# Patient Record
Sex: Male | Born: 1940 | Race: White | Hispanic: No | Marital: Married | Smoking: Former smoker
Health system: Southern US, Community
[De-identification: ages and names within clinical notes are randomized; demographics above are authoritative.]

## PROBLEM LIST (undated history)

## (undated) DIAGNOSIS — N400 Enlarged prostate without lower urinary tract symptoms: Secondary | ICD-10-CM

## (undated) DIAGNOSIS — Z972 Presence of dental prosthetic device (complete) (partial): Secondary | ICD-10-CM

## (undated) DIAGNOSIS — N189 Chronic kidney disease, unspecified: Secondary | ICD-10-CM

## (undated) DIAGNOSIS — E785 Hyperlipidemia, unspecified: Secondary | ICD-10-CM

## (undated) DIAGNOSIS — R51 Headache: Secondary | ICD-10-CM

## (undated) DIAGNOSIS — Z87442 Personal history of urinary calculi: Secondary | ICD-10-CM

## (undated) DIAGNOSIS — H9190 Unspecified hearing loss, unspecified ear: Secondary | ICD-10-CM

## (undated) DIAGNOSIS — Z973 Presence of spectacles and contact lenses: Secondary | ICD-10-CM

## (undated) DIAGNOSIS — I10 Essential (primary) hypertension: Secondary | ICD-10-CM

## (undated) HISTORY — PX: APPENDECTOMY: SHX54

## (undated) HISTORY — PX: HERNIA REPAIR: SHX51

## (undated) HISTORY — DX: Benign prostatic hyperplasia without lower urinary tract symptoms: N40.0

## (undated) HISTORY — PX: NASAL SEPTUM SURGERY: SHX37

## (undated) HISTORY — DX: Essential (primary) hypertension: I10

## (undated) HISTORY — PX: TONSILLECTOMY: SUR1361

## (undated) HISTORY — PX: OTHER SURGICAL HISTORY: SHX169

## (undated) HISTORY — PX: BACK SURGERY: SHX140

## (undated) HISTORY — PX: ACHILLES TENDON SURGERY: SHX542

## (undated) HISTORY — DX: Hyperlipidemia, unspecified: E78.5

## (undated) HISTORY — PX: COLONOSCOPY: SHX174

---

## 2003-12-08 ENCOUNTER — Ambulatory Visit (HOSPITAL_COMMUNITY): Admission: RE | Admit: 2003-12-08 | Discharge: 2003-12-08 | Payer: Self-pay | Admitting: Gastroenterology

## 2004-02-20 ENCOUNTER — Encounter: Admission: RE | Admit: 2004-02-20 | Discharge: 2004-02-20 | Payer: Self-pay | Admitting: *Deleted

## 2004-02-29 ENCOUNTER — Ambulatory Visit (HOSPITAL_BASED_OUTPATIENT_CLINIC_OR_DEPARTMENT_OTHER): Admission: RE | Admit: 2004-02-29 | Discharge: 2004-02-29 | Payer: Self-pay | Admitting: Orthopedic Surgery

## 2004-02-29 ENCOUNTER — Ambulatory Visit (HOSPITAL_COMMUNITY): Admission: RE | Admit: 2004-02-29 | Discharge: 2004-02-29 | Payer: Self-pay | Admitting: Orthopedic Surgery

## 2004-02-29 ENCOUNTER — Encounter (INDEPENDENT_AMBULATORY_CARE_PROVIDER_SITE_OTHER): Payer: Self-pay | Admitting: *Deleted

## 2005-01-23 ENCOUNTER — Ambulatory Visit (HOSPITAL_COMMUNITY): Admission: RE | Admit: 2005-01-23 | Discharge: 2005-01-23 | Payer: Self-pay | Admitting: Orthopedic Surgery

## 2005-01-23 ENCOUNTER — Ambulatory Visit (HOSPITAL_BASED_OUTPATIENT_CLINIC_OR_DEPARTMENT_OTHER): Admission: RE | Admit: 2005-01-23 | Discharge: 2005-01-23 | Payer: Self-pay | Admitting: Orthopedic Surgery

## 2005-05-29 ENCOUNTER — Ambulatory Visit (HOSPITAL_BASED_OUTPATIENT_CLINIC_OR_DEPARTMENT_OTHER): Admission: RE | Admit: 2005-05-29 | Discharge: 2005-05-29 | Payer: Self-pay | Admitting: Orthopedic Surgery

## 2005-12-13 ENCOUNTER — Ambulatory Visit (HOSPITAL_BASED_OUTPATIENT_CLINIC_OR_DEPARTMENT_OTHER): Admission: RE | Admit: 2005-12-13 | Discharge: 2005-12-13 | Payer: Self-pay | Admitting: Orthopedic Surgery

## 2007-01-21 ENCOUNTER — Encounter: Admission: RE | Admit: 2007-01-21 | Discharge: 2007-01-21 | Payer: Self-pay | Admitting: Orthopaedic Surgery

## 2007-04-15 ENCOUNTER — Ambulatory Visit (HOSPITAL_BASED_OUTPATIENT_CLINIC_OR_DEPARTMENT_OTHER): Admission: RE | Admit: 2007-04-15 | Discharge: 2007-04-15 | Payer: Self-pay | Admitting: Orthopedic Surgery

## 2009-03-21 ENCOUNTER — Ambulatory Visit (HOSPITAL_BASED_OUTPATIENT_CLINIC_OR_DEPARTMENT_OTHER): Admission: RE | Admit: 2009-03-21 | Discharge: 2009-03-21 | Payer: Self-pay | Admitting: Orthopedic Surgery

## 2009-03-21 ENCOUNTER — Encounter (INDEPENDENT_AMBULATORY_CARE_PROVIDER_SITE_OTHER): Payer: Self-pay | Admitting: Orthopedic Surgery

## 2010-06-11 ENCOUNTER — Encounter: Payer: Self-pay | Admitting: Orthopedic Surgery

## 2010-08-23 LAB — POCT HEMOGLOBIN-HEMACUE: Hemoglobin: 15.3 g/dL (ref 13.0–17.0)

## 2010-08-24 LAB — BASIC METABOLIC PANEL
BUN: 20 mg/dL (ref 6–23)
CO2: 27 mEq/L (ref 19–32)
Calcium: 8.7 mg/dL (ref 8.4–10.5)
Chloride: 105 mEq/L (ref 96–112)
Creatinine, Ser: 1.36 mg/dL (ref 0.4–1.5)
GFR calc Af Amer: >60 mL/min (ref >60)
GFR calc non Af Amer: 52 mL/min — ABNORMAL LOW (ref >60)
Glucose, Bld: 81 mg/dL (ref 70–99)
Potassium: 4.1 mEq/L (ref 3.5–5.1)
Sodium: 139 mEq/L (ref 135–145)

## 2010-10-03 NOTE — Op Note (Signed)
NAME:  JAYMIEN, LANDIN            ACCOUNT NO.:  [id number]   MEDICAL RECORD NO.:  [id number]          PATIENT TYPE:  AMB   LOCATION:  DSC                          FACILITY:  [facility]   PHYSICIAN:  Cindee Salt, M.D.       DATE OF BIRTH:  Feb 19, 1941   DATE OF PROCEDURE:  04/15/2007  DATE OF DISCHARGE:                               OPERATIVE REPORT   PREOPERATIVE DIAGNOSIS:  Stenosing tenosynovitis, left thumb.   POSTOPERATIVE DIAGNOSIS:  Stenosing tenosynovitis, left thumb.   OPERATION:  Release A1 pulley left thumb.   SURGEON:  Cindee Salt, M.D.   ANESTHESIA:  Forearm based IV regional.   ANESTHESIOLOGIST:  [place].   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  This has not responded to conservative treatment.  He is desirous of undergoing release.  He is aware of risks and  complications including infection, recurrence, injury to arteries,  nerves, tendons, incomplete relief of symptoms, dystrophy.  In the  preoperative area the patient is seen, the extremity marked by both the  patient and surgeon.  Antibiotic given, questions again encouraged and  answered.   PROCEDURE:  The patient is brought to the operating room where forearm  based IV regional anesthetic was carried out without difficulty.  He was  prepped using DuraPrep, supine position, left arm free.  After adequate  anesthesia was afforded, a transverse incision was made over the A1  pulley, metacarpophalangeal joint flexion crease of the left thumb,  carried down through subcutaneous tissue.  Bleeders were  electrocauterized.  Neurovascular bundles identified.  Retractors  placed.  The A1 pulley was identified and this was incised on its radial  aspect.  The oblique pulley was left intact.  Thumb placed through full  range motion, no further triggering was evident.  The wound was  irrigated.  Skin closed with interrupted 5-0 Vicryl Rapide sutures.  Sterile  compressive dressing was applied.  The patient tolerated the  procedure well and was taken to the recovery for observation in  satisfactory condition.  He is discharged home to return to [facility]  of [place] in one week on Vicodin.           ______________________________  Cindee Salt, M.D.     GK/MEDQ  D:  04/15/2007  T:  04/15/2007  Job:  [id number]   cc:   Dr. Janey Greaser

## 2010-10-06 NOTE — Op Note (Signed)
NAME:  [name] [name], [name] [name]            ACCOUNT NO.:  [id number]   MEDICAL RECORD NO.:  [id number]          PATIENT TYPE:  AMB   LOCATION:  DSC                          FACILITY:  [facility]   PHYSICIAN:  Cindee Salt, M.D.       DATE OF BIRTH:  07/14/1940   DATE OF PROCEDURE:  12/13/2005  DATE OF DISCHARGE:                                 OPERATIVE REPORT   PREOPERATIVE DIAGNOSIS:  Stenosing tenosynovitis, right thumb.   POSTOPERATIVE DIAGNOSIS:  Stenosing tenosynovitis, right thumb.   OPERATION:  Release A1 pulley, right thumb.   SURGEON:  Merlyn Lot, M.D.   ASSISTANTCarolyne Fiscal, R.N.   ANESTHESIA:  IV regional.   HISTORY:  The patient is a 70 year old male with a history of triggering of  his right thumb.  This has not responded to conservative treatment.  He has  elected to undergo surgical release.  Perioperative course discussed along  with the risks and complications.  She is aware there is no guarantee with  surgery, possibility of infection, recurrence, injury to arteries-nerves-  tendons, dystrophy.  In the preoperative area, questions were encouraged and  answered.  The extremity was marked by both patient and surgeon.   PROCEDURE:  The patient is brought to the operating room where a forearm  based IV regional anesthetic was carried out without difficulty.  He was  prepped using DuraPrep, supine position, right arm free.  A transverse  incision was made over the A1 pulley of the right thumb, carried down  through subcutaneous tissue.  The neurovascular bundles were identified and  retracted.  The A1 pulley was then released in its radial aspect.  Care was  taken to protect the oblique pulley.  The thumb placed through a full range  motion.  No further triggering was evident.  The wound was irrigated.  The  skin was closed with interrupted 5-0 nylon sutures.  A sterile compressive  dressing was applied.  The patient tolerated the procedure well and was  taken to the recovery  observation in satisfactory condition.  He was  discharged home to return to the [facility] of [place] in 1 week on  Vicodin.           ______________________________  Cindee Salt, M.D.     GK/MEDQ  D:  12/13/2005  T:  12/13/2005  Job:  [id number]

## 2010-10-06 NOTE — Op Note (Signed)
NAMEMarland [name]  ARJEN, DERINGER            ACCOUNT NO.:  [id number]   MEDICAL RECORD NO.:  [id number]          PATIENT TYPE:  AMB   LOCATION:  DSC                          FACILITY:  [facility]   PHYSICIAN:  Cindee Salt, M.D.       DATE OF BIRTH:  1941-04-26   DATE OF PROCEDURE:  DATE OF DISCHARGE:                                 OPERATIVE REPORT   DATE OF PROCEDURE:  February 29, 2004.   PREOPERATIVE DIAGNOSIS:  Mucoid cyst, IP joint, right thumb.   POSTOPERATIVE DIAGNOSIS:  Mucoid cyst, IP joint, right thumb.   OPERATION:  Excision, mucoid cyst; debridement interphalangeal joint, right  thumb.   SUBJECTIVE:  Cindee Salt, MD.   Threasa HeadsCarolyne Fiscal.   ANESTHESIA:  Forearm-based IV regional.   HISTORY:  The patient is a 70 year old male with a history of a mass, DIP  joint of his thumb.  This transilluminates.  He shows mild, degenerative  changes beneath it.   PROCEDURE:  The patient is brought to the operating room where a forearm-  based IV regional anesthetic was carried out without difficulty.  He was  prepped using Duraprep in the supine position, the right arm free.  A  curvilinear incision was made over the IP joint, carried down through  subcutaneous tissue, bleeders were electrocauterized.  The dissection  carried down to the cyst, which kind of traded the corner of the eccentric  tendon.  With sharp dissection, this was dissected free.  The joint was  opened radially and ulnarly, and a thorough debridement was performed,  including a synovectomy of the joint and debridement of exostoses.  When no  further lesions were identified, the wound was copiously irrigated with  saline, and the skin closed with interrupted 5-0 nylon sutures.  A sterile  compressive dressing and splint to the right thumb were applied.  The  patient tolerated the procedure well and was taken to the recovery room for  observation in satisfactory condition.  He is discharged home to return to  the [facility] of  [place] in 1 week on Vicodin.       GK/MEDQ  D:  02/29/2004  T:  02/29/2004  Job:  [id number]

## 2010-10-06 NOTE — Op Note (Signed)
NAME:  [name] [name], [name] [name]            ACCOUNT NO.:  [id number]   MEDICAL RECORD NO.:  [id number]          PATIENT TYPE:  AMB   LOCATION:  DSC                          FACILITY:  [facility]   PHYSICIAN:  Cindee Salt, M.D.       DATE OF BIRTH:  08/14/1940   DATE OF PROCEDURE:  01/23/2005  DATE OF DISCHARGE:                                 OPERATIVE REPORT   PREOPERATIVE DIAGNOSIS:  Carpal tunnel syndrome right hand.   POSTOPERATIVE DIAGNOSIS:  Carpal tunnel syndrome right hand.   OPERATION:  Decompression right median nerve.   SURGEON:  Cindee Salt, M.D.   ASSISTANT:  Carolyne Fiscal R.N.   ANESTHESIA:  Forearm based IV regional.   HISTORY:  The patient is a 70 year old male with history of carpal tunnel  syndrome, EMG nerve conductions positive which has not responded to  conservative treatment.   PROCEDURE:  The patient is brought to the operating room where his arm was  marked by patient and surgeon. He was given a forearm based IV regional  anesthetic and prepped using DuraPrep, supine position, right arm free.  Longitudinal incision was made in the palm, carried down through  subcutaneous tissue. Bleeders were electrocauterized. Palmar fascia was  split, superficial palmar arch identified, flexor tendon of the ring and  little finger identified to the ulnar side of median nerve.  The carpal  retinaculum was incised with sharp dissection, right angle and Sewell  retractor placed between skin and forearm fascia.  Fascia was released for  approximately a 1.5 cm proximal to the wrist crease under direct vision.  The canal was explored. No further lesions were identified. The wound was  irrigated. Skin was closed interrupted 5-0 nylon sutures. A sterile  compressive dressing and splint was applied. The patient tolerated the  procedure well and was taken to the recovery room for observation in  satisfactory condition. He is discharged home to return to the [facility]  of [place] in one week on  Vicodin.           ______________________________  Cindee Salt, M.D.     GK/MEDQ  D:  01/23/2005  T:  01/23/2005  Job:  [id number]   cc:   Al Decant. Janey Greaser, MD  [address]  [place]  [place] [address]  Fax: [phone]

## 2010-10-06 NOTE — Op Note (Signed)
NAME:  [name] [name], [name] [name]            ACCOUNT NO.:  [id number]   MEDICAL RECORD NO.:  [id number]          PATIENT TYPE:  AMB   LOCATION:  DSC                          FACILITY:  [facility]   PHYSICIAN:  Cindee Salt, M.D.       DATE OF BIRTH:  April 08, 1941   DATE OF PROCEDURE:  05/29/2005  DATE OF DISCHARGE:                               OPERATIVE REPORT   PREOPERATIVE DIAGNOSIS:  Carpal tunnel syndrome left hand.   POSTOPERATIVE DIAGNOSIS:  Carpal tunnel syndrome left hand.   OPERATION:  Decompression left median nerve.   SURGEON:  Cindee Salt, M.D.   ASSISTANT:  Carolyne Fiscal, R.N.   ANESTHESIA:  General.   HISTORY:  The patient is a 70 year old male with a history of carpal  tunnel syndrome. EMG/nerve conductions positive which has not responded  to conservative treatment.   DESCRIPTION OF PROCEDURE:  The patient is brought to the operating room  where a general anesthetic was carried out without difficulty.  He was  prepped using DuraPrep, supine position, left arm free.  A longitudinal  incision was made in the palm and carried down through subcutaneous  tissue.  Bleeders were electrocauterized.  Palmar fascia was split.  Superficial palmar arch identified.  Flexor tendon to the ring and  little finger identified to the ulnar side of the median nerve.  Carpal  retinaculum was incised with sharp dissection, right angle and Sewall  retractor placed between skin forearm fascia.  Fascia was released for  approximately 1.5 cm proximal to the wrist crease under direct vision.  The canal was explored.  No further lesions were identified.  The wound  was irrigated.  The skin was closed with interrupted 5-0 nylon sutures.  The tenosynovial tissue was moderately thickened.   DISPOSITION:  The patient was taken to the recovery observation in  satisfactory condition.  He is discharged to return to the [facility]  of [place] in one week on Vicodin.           ______________________________  Cindee Salt, M.D.     GK/MEDQ  D:  05/29/2005  T:  05/30/2005  Job:  [id number]

## 2010-10-06 NOTE — Op Note (Signed)
NAME:  [name] [name], [name] [name]                      ACCOUNT NO.:  [id number]   MEDICAL RECORD NO.:  [id number]                   PATIENT TYPE:  AMB   LOCATION:  ENDO                                 FACILITY:  [facility]   PHYSICIAN:  Danise Edge, M.D.                DATE OF BIRTH:  1940-08-21   DATE OF PROCEDURE:  12/08/2003  DATE OF DISCHARGE:                                 OPERATIVE REPORT   PROCEDURE:  Screening colonoscopy.   INDICATIONS FOR PROCEDURE:  [name] is a 70 year old male born  1940-12-15.  [name] is scheduled to undergo his first screening  colonoscopy with polypectomy to prevent colon cancer.   ENDOSCOPIST:  Danise Edge, M.D.   PREMEDICATION:  Versed 7 mg, Demerol 70 mg.   DESCRIPTION OF PROCEDURE:  After obtaining informed consent, [name]  was placed in the left lateral decubitus position. I administered  intravenous Demerol and intravenous Versed to achieve conscious sedation for  the procedure. The patient's blood pressure, oxygen saturation and cardiac  rhythm were monitored throughout the procedure and documented in the medical  record.   Anal inspection and digital rectal exam were normal. The Olympus adjustable  pediatric colonoscope was introduced into the rectum and advanced to the  cecum. Colonic preparation for the exam today was excellent.   RECTUM:  Normal.   SIGMOID COLON AND DESCENDING COLON:  Normal.   SPLENIC FLEXURE:  Normal.   TRANSVERSE COLON:  Normal.   HEPATIC FLEXURE:  Normal.   ASCENDING COLON:  Normal.   CECUM AND ILEOCECAL VALVE:  Normal.   ASSESSMENT:  Normal screening proctocolonoscopy to the cecum.                                               Danise Edge, M.D.    MJ/MEDQ  D:  12/08/2003  T:  12/08/2003  Job:  [id number]   cc:   Al Decant. Janey Greaser, MD  [address]  [place]  [place] [address]  Fax: [phone]

## 2011-02-27 LAB — BASIC METABOLIC PANEL
BUN: 25 — ABNORMAL HIGH
CO2: 29
Calcium: 9.2
Chloride: 103
Creatinine, Ser: 1.23
GFR calc Af Amer: >60
GFR calc non Af Amer: 59 — ABNORMAL LOW
Glucose, Bld: 99
Potassium: 4.9
Sodium: 139

## 2011-02-27 LAB — POCT HEMOGLOBIN-HEMACUE
Hemoglobin: 15.6
Operator id: 123881

## 2013-01-21 ENCOUNTER — Ambulatory Visit (INDEPENDENT_AMBULATORY_CARE_PROVIDER_SITE_OTHER): Payer: BC Managed Care – PPO | Admitting: Surgery

## 2013-01-26 ENCOUNTER — Encounter (INDEPENDENT_AMBULATORY_CARE_PROVIDER_SITE_OTHER): Payer: Self-pay

## 2013-01-26 ENCOUNTER — Ambulatory Visit (INDEPENDENT_AMBULATORY_CARE_PROVIDER_SITE_OTHER): Payer: BC Managed Care – PPO | Admitting: Surgery

## 2013-01-26 ENCOUNTER — Encounter (INDEPENDENT_AMBULATORY_CARE_PROVIDER_SITE_OTHER): Payer: Self-pay | Admitting: Surgery

## 2013-01-26 VITALS — BP 136/80 | HR 52 | Temp 97.4°F | Resp 14 | Ht 69.0 in | Wt 204.0 lb

## 2013-01-26 DIAGNOSIS — K4090 Unilateral inguinal hernia, without obstruction or gangrene, not specified as recurrent: Secondary | ICD-10-CM

## 2013-01-26 NOTE — Patient Instructions (Signed)
Hernia, Surgical Repair A hernia occurs when an internal organ pushes out through a weak spot in the belly (abdominal) wall muscles. Hernias commonly occur in the groin and around the navel. Hernias often can be pushed back into place (reduced). Most hernias tend to get worse over time. Problems occur when abdominal contents get stuck in the opening (incarcerated hernia). The blood supply gets cut off (strangulated hernia). This is an emergency and needs surgery. Otherwise, hernia repair can be an elective procedure. This means you can schedule this at your convenience when an emergency is not present. Because complications can occur, if you decide to repair the hernia, it is best to do it soon. When it becomes an emergency procedure, there is increased risk of complications after surgery. CAUSES   Heavy lifting.  Obesity.  Prolonged coughing.  Straining to move your bowels.  Hernias can also occur through a cut (incision) by a surgeonafter an abdominal operation. HOME CARE INSTRUCTIONS Before the repair:  Bed rest is not required. You may continue your normal activities, but avoid heavy lifting (more than 10 pounds) or straining. Cough gently. If you are a smoker, it is best to stop. Even the best hernia repair can break down with the continual strain of coughing.  Do not wear anything tight over your hernia. Do not try to keep it in with an outside bandage or truss. These can damage abdominal contents if they are trapped in the hernia sac.  Eat a normal diet. Avoid constipation. Straining over long periods of time to have a bowel movement will increase hernia size. It also can breakdown repairs. If you cannot do this with diet alone, laxatives or stool softeners may be used. PRIOR TO SURGERY, SEEK IMMEDIATE MEDICAL CARE IF: You have problems (symptoms) of a trapped (incarcerated) hernia. Symptoms include:  An oral temperature above 102 F (38.9 C) develops, or as your caregiver  suggests.  Increasing abdominal pain.  Feeling sick to your stomach(nausea) and vomiting.  You stop passing gas or stool.  The hernia is stuck outside the abdomen, looks discolored, feels hard, or is tender.  You have any changes in your bowel habits or in the hernia that is unusual for you. LET YOUR CAREGIVERS KNOW ABOUT THE FOLLOWING:  Allergies.  Medications taken including herbs, eye drops, over the counter medications, and creams.  Use of steroids (by mouth or creams).  Family or personal history of problems with anesthetics or Novocaine.  Possibility of pregnancy, if this applies.  Personal history of blood clots (thrombophlebitis).  Family or personal history of bleeding or blood problems.  Previous surgery.  Other health problems. BEFORE THE PROCEDURE You should be present 1 hour prior to your procedure, or as directed by your caregiver.  AFTER THE PROCEDURE After surgery, you will be taken to the recovery area. A nurse will watch and check your progress there. Once you are awake, stable, and taking fluids well, you will be allowed to go home as long as there are no problems. Once home, an ice pack (wrapped in a light towel) applied to your operative site may help with discomfort. It may also keep the swelling down. Do not lift anything heavier than 10 pounds (4.55 kilograms). Take showers not baths. Do not drive while taking narcotics. Follow instructions as suggested by your caregiver.  SEEK IMMEDIATE MEDICAL CARE IF: After surgery:  There is redness, swelling, or increasing pain in the wound.  There is pus coming from the wound.  There is   drainage from a wound lasting longer than 1 day.  An unexplained oral temperature above 102 F (38.9 C) develops.  You notice a foul smell coming from the wound or dressing.  There is a breaking open of a wound (edged not staying together) after the sutures have been removed.  You notice increasing pain in the shoulders  (shoulder strap areas).  You develop dizzy episodes or fainting while standing.  You develop persistent nausea or vomiting.  You develop a rash.  You have difficulty breathing.  You develop any reaction or side effects to medications given. MAKE SURE YOU:   Understand these instructions.  Will watch your condition.  Will get help right away if you are not doing well or get worse. Document Released: 10/31/2000 Document Revised: 07/30/2011 Document Reviewed: 09/23/2007 Exit[employer] Patient Information 2014 [employer]. Hernia A hernia occurs when an internal organ pushes out through a weak spot in the abdominal wall. Hernias most commonly occur in the groin and around the navel. Hernias often can be pushed back into place (reduced). Most hernias tend to get worse over time. Some abdominal hernias can get stuck in the opening (irreducible or incarcerated hernia) and cannot be reduced. An irreducible abdominal hernia which is tightly squeezed into the opening is at risk for impaired blood supply (strangulated hernia). A strangulated hernia is a medical emergency. Because of the risk for an irreducible or strangulated hernia, surgery may be recommended to repair a hernia. CAUSES   Heavy lifting.  Prolonged coughing.  Straining to have a bowel movement.  A cut (incision) made during an abdominal surgery. HOME CARE INSTRUCTIONS   Bed rest is not required. You may continue your normal activities.  Avoid lifting more than 10 pounds (4.5 kg) or straining.  Cough gently. If you are a smoker it is best to stop. Even the best hernia repair can break down with the continual strain of coughing. Even if you do not have your hernia repaired, a cough will continue to aggravate the problem.  Do not wear anything tight over your hernia. Do not try to keep it in with an outside bandage or truss. These can damage abdominal contents if they are trapped within the hernia sac.  Eat a normal  diet.  Avoid constipation. Straining over long periods of time will increase hernia size and encourage breakdown of repairs. If you cannot do this with diet alone, stool softeners may be used. SEEK IMMEDIATE MEDICAL CARE IF:   You have a fever.  You develop increasing abdominal pain.  You feel nauseous or vomit.  Your hernia is stuck outside the abdomen, looks discolored, feels hard, or is tender.  You have any changes in your bowel habits or in the hernia that are unusual for you.  You have increased pain or swelling around the hernia.  You cannot push the hernia back in place by applying gentle pressure while lying down. MAKE SURE YOU:   Understand these instructions.  Will watch your condition.  Will get help right away if you are not doing well or get worse. Document Released: 05/07/2005 Document Revised: 07/30/2011 Document Reviewed: 12/25/2007 Exit[employer] Patient Information 2014 [employer].  

## 2013-01-26 NOTE — Progress Notes (Signed)
Patient ID: [name] [name], male   DOB: 02/14/1941, 72 y.o.   MRN: [id number]  Chief Complaint  Patient presents with  . New Evaluation    eval RIH    HPI [name] [name] is a 72 y.o. male.  Is sent at the request of Dr. Donovan Kail  For a right inguinal hernia. This was picked up on a routine physical exam. Patient denies any groin pain or any other problem with this area. He was unaware he had a hernia. He does some heavy lifting and has had no problems with this. Denies any nausea or vomiting. Denies any difficulty with bowel or bladder function. HPI  Past Medical History  Diagnosis Date  . Hypertension   . Hyperlipidemia   . BPH (benign prostatic hyperplasia)     Past Surgical History  Procedure Laterality Date  . Appendectomy    . Back surgery    . Nasal septum surgery    . Achilles tendon surgery    . Right hand surgery      Family History  Problem Relation Age of Onset  . Diabetes Father   . Hypertension Father   . Heart disease Father   . Uterine cancer Mother   . Hypertension Mother     Social History History  Substance Use Topics  . Smoking status: Former Smoker    Quit date: 05/21/1964  . Smokeless tobacco: Never Used  . Alcohol Use: No    No Known Allergies  Current Outpatient Prescriptions  Medication Sig Dispense Refill  . bisoprolol-hydrochlorothiazide (ZIAC) 10-6.25 MG per tablet Take 1 tablet by mouth daily.      . Multiple Vitamin (MULTIVITAMIN) tablet Take 1 tablet by mouth daily.      . tamsulosin (FLOMAX) 0.4 MG CAPS capsule Take by mouth.      . lovastatin (MEVACOR) 40 MG tablet Take 40 mg by mouth at bedtime.       No current facility-administered medications for this visit.    Review of Systems Review of Systems  Constitutional: Negative for fever, chills and unexpected weight change.  HENT: Negative for hearing loss, congestion, sore throat, trouble swallowing and voice change.   Eyes: Negative for visual disturbance.   Respiratory: Negative for cough and wheezing.   Cardiovascular: Negative for chest pain, palpitations and leg swelling.  Gastrointestinal: Negative for nausea, vomiting, abdominal pain, diarrhea, constipation, blood in stool, abdominal distention, anal bleeding and rectal pain.  Genitourinary: Negative for hematuria and difficulty urinating.  Musculoskeletal: Negative for arthralgias.  Skin: Negative for rash and wound.  Neurological: Negative for seizures, syncope, weakness and headaches.  Hematological: Negative for adenopathy. Does not bruise/bleed easily.  Psychiatric/Behavioral: Negative for confusion.    Blood pressure 136/80, pulse 52, temperature 97.4 F (36.3 C), temperature source Temporal, resp. rate 14, height 5\' 9"  (1.753 m), weight 204 lb (92.534 kg).  Physical Exam Physical Exam  Constitutional: He is oriented to person, place, and time. He appears well-developed and well-nourished.  HENT:  Head: Normocephalic and atraumatic.  Eyes: Pupils are equal, round, and reactive to light.  Neck: Normal range of motion. Neck supple.  Cardiovascular: Normal rate and regular rhythm.   Pulmonary/Chest: Effort normal and breath sounds normal.  Abdominal: Soft. Bowel sounds are normal. There is no tenderness. A hernia is present. Hernia confirmed positive in the right inguinal area. Hernia confirmed negative in the left inguinal area.  Musculoskeletal: Normal range of motion.  Neurological: He is alert and oriented to person, place,  and time.  Skin: Skin is warm and dry.  Psychiatric: He has a normal mood and affect. His behavior is normal. Judgment and thought content normal.      Assessment    Right inguinal hernia reducible asymptomatic    Plan    Discussed surgical repair of his right inguinal hernia versus observation. There is data to support observation of small asymptomatic inguinal hernias. Safe practice. I discussed surgical repair with him today as well use of  mesh. He is aware of this condition will not go away without surgery. He would like to wait for now for repair. Information about hernias given. Discussed incarceration and strangulation with him today and the signs and symptoms of this. He will call back to schedule for surgery when he is ready or if symptoms worsen.       Thomson Herbers A. 01/26/2013, 9:37 AM

## 2013-04-24 ENCOUNTER — Encounter (INDEPENDENT_AMBULATORY_CARE_PROVIDER_SITE_OTHER): Payer: Self-pay | Admitting: Surgery

## 2013-04-24 ENCOUNTER — Ambulatory Visit (INDEPENDENT_AMBULATORY_CARE_PROVIDER_SITE_OTHER): Payer: BC Managed Care – PPO | Admitting: Surgery

## 2013-04-24 ENCOUNTER — Encounter (INDEPENDENT_AMBULATORY_CARE_PROVIDER_SITE_OTHER): Payer: Self-pay

## 2013-04-24 VITALS — BP 140/78 | HR 68 | Temp 98.7°F | Resp 14 | Ht 69.0 in | Wt 203.2 lb

## 2013-04-24 DIAGNOSIS — K4090 Unilateral inguinal hernia, without obstruction or gangrene, not specified as recurrent: Secondary | ICD-10-CM

## 2013-04-24 NOTE — Patient Instructions (Signed)

## 2013-04-24 NOTE — Progress Notes (Signed)
Patient ID: [name] [name]., male   DOB: 1940-06-09, 72 y.o.   MRN: [id number]  Chief Complaint  Patient presents with  . Routine Post Op    reck hernia    HPI [name] [name]. is a 72 y.o. male.  Is sent at the request of Dr. Donovan Kail  For a right inguinal hernia. This was picked up on a routine physical exam. Patient denies any groin pain or any other problem with this area. He was unaware he had a hernia. He does some heavy lifting and has had no problems with this. Denies any nausea or vomiting. Denies any difficulty with bowel or bladder function. HPI  Past Medical History  Diagnosis Date  . Hypertension   . Hyperlipidemia   . BPH (benign prostatic hyperplasia)     Past Surgical History  Procedure Laterality Date  . Appendectomy    . Back surgery    . Nasal septum surgery    . Achilles tendon surgery    . Right hand surgery      Family History  Problem Relation Age of Onset  . Diabetes Father   . Hypertension Father   . Heart disease Father   . Uterine cancer Mother   . Hypertension Mother     Social History History  Substance Use Topics  . Smoking status: Former Smoker    Quit date: 05/21/1964  . Smokeless tobacco: Never Used  . Alcohol Use: No    No Known Allergies  Current Outpatient Prescriptions  Medication Sig Dispense Refill  . bisoprolol-hydrochlorothiazide (ZIAC) 10-6.25 MG per tablet Take 1 tablet by mouth daily.      Marland Kitchen lovastatin (MEVACOR) 40 MG tablet Take 40 mg by mouth at bedtime.      . Multiple Vitamin (MULTIVITAMIN) tablet Take 1 tablet by mouth daily.      . tamsulosin (FLOMAX) 0.4 MG CAPS capsule Take by mouth.       No current facility-administered medications for this visit.    Review of Systems Review of Systems  Constitutional: Negative for fever, chills and unexpected weight change.  HENT: Negative for hearing loss, congestion, sore throat, trouble swallowing and voice change.   Eyes: Negative for visual disturbance.   Respiratory: Negative for cough and wheezing.   Cardiovascular: Negative for chest pain, palpitations and leg swelling.  Gastrointestinal: Negative for nausea, vomiting, abdominal pain, diarrhea, constipation, blood in stool, abdominal distention, anal bleeding and rectal pain.  Genitourinary: Negative for hematuria and difficulty urinating.  Musculoskeletal: Negative for arthralgias.  Skin: Negative for rash and wound.  Neurological: Negative for seizures, syncope, weakness and headaches.  Hematological: Negative for adenopathy. Does not bruise/bleed easily.  Psychiatric/Behavioral: Negative for confusion.    Blood pressure 140/78, pulse 68, temperature 98.7 F (37.1 C), temperature source Temporal, resp. rate 14, height 5\' 9"  (1.753 m), weight 203 lb 3.2 oz (92.171 kg).  Physical Exam Physical Exam  Constitutional: He is oriented to person, place, and time. He appears well-developed and well-nourished.  HENT:  Head: Normocephalic and atraumatic.  Eyes: Pupils are equal, round, and reactive to light.  Neck: Normal range of motion. Neck supple.  Cardiovascular: Normal rate and regular rhythm.   Pulmonary/Chest: Effort normal and breath sounds normal.  Abdominal: Soft. Bowel sounds are normal. There is no tenderness. A hernia is present. Hernia confirmed positive in the right inguinal area. Hernia confirmed negative in the left inguinal area.  Musculoskeletal: Normal range of motion.  Neurological: He is alert and oriented  to person, place, and time.  Skin: Skin is warm and dry.  Psychiatric: He has a normal mood and affect. His behavior is normal. Judgment and thought content normal.      Assessment    Right inguinal hernia reducible asymptomatic    Plan    Pt having more discomfort now and wishes repair. Will schedule for [facility] repair with mesh.The risk of hernia repair include bleeding,  Infection,   Recurrence of the hernia,  Mesh use, chronic pain,  Organ injury,  Bowel  injury,  Bladder injury,   nerve injury with numbness around the incision,  Death,  and worsening of preexisting  medical problems.  The alternatives to surgery have been discussed as well..  Long term expectations of both operative and non operative treatments have been discussed.   The patient agrees to proceed.    [name] [name] A. 04/24/2013, 10:46 AM

## 2013-05-18 ENCOUNTER — Encounter (HOSPITAL_COMMUNITY): Payer: Self-pay

## 2013-05-22 ENCOUNTER — Encounter (HOSPITAL_COMMUNITY): Payer: Self-pay

## 2013-05-22 ENCOUNTER — Encounter (HOSPITAL_COMMUNITY)
Admission: RE | Admit: 2013-05-22 | Discharge: 2013-05-22 | Disposition: A | Discharge status: Home / Self Care | Payer: BC Managed Care – PPO | Source: Ambulatory Visit | Attending: Surgery | Admitting: Surgery

## 2013-05-22 ENCOUNTER — Encounter (HOSPITAL_COMMUNITY)
Admission: RE | Admit: 2013-05-22 | Discharge: 2013-05-22 | Disposition: A | Discharge status: Home / Self Care | Payer: BC Managed Care – PPO | Source: Ambulatory Visit | Attending: Anesthesiology | Admitting: Anesthesiology

## 2013-05-22 DIAGNOSIS — Z01818 Encounter for other preprocedural examination: Secondary | ICD-10-CM | POA: Insufficient documentation

## 2013-05-22 DIAGNOSIS — Z01810 Encounter for preprocedural cardiovascular examination: Secondary | ICD-10-CM | POA: Insufficient documentation

## 2013-05-22 DIAGNOSIS — Z01812 Encounter for preprocedural laboratory examination: Secondary | ICD-10-CM | POA: Insufficient documentation

## 2013-05-22 HISTORY — DX: Chronic kidney disease, unspecified: N18.9

## 2013-05-22 HISTORY — DX: Headache: R51

## 2013-05-22 LAB — BASIC METABOLIC PANEL
BUN: 21 mg/dL (ref 6–23)
CALCIUM: 8.6 mg/dL (ref 8.4–10.5)
CO2: 24 mEq/L (ref 19–32)
Chloride: 104 mEq/L (ref 96–112)
Creatinine, Ser: 1.18 mg/dL (ref 0.50–1.35)
GFR calc Af Amer: 70 mL/min — ABNORMAL LOW (ref >90)
GFR, EST NON AFRICAN AMERICAN: 61 mL/min — AB (ref >90)
GLUCOSE: 111 mg/dL — AB (ref 70–99)
Potassium: 4.7 mEq/L (ref 3.7–5.3)
Sodium: 142 mEq/L (ref 137–147)

## 2013-05-22 LAB — CBC
HCT: 37.4 % — ABNORMAL LOW (ref 39.0–52.0)
Hemoglobin: 12.1 g/dL — ABNORMAL LOW (ref 13.0–17.0)
MCH: 26.6 pg (ref 26.0–34.0)
MCHC: 32.4 g/dL (ref 30.0–36.0)
MCV: 82.2 fL (ref 78.0–100.0)
PLATELETS: 208 10*3/uL (ref 150–400)
RBC: 4.55 MIL/uL (ref 4.22–5.81)
RDW: 13.2 % (ref 11.5–15.5)
WBC: 9.3 10*3/uL (ref 4.0–10.5)

## 2013-05-22 MED ORDER — CHLORHEXIDINE GLUCONATE 4 % EX LIQD: 1.0000 "application " | Freq: Once | CUTANEOUS | Status: DC

## 2013-05-25 MED ORDER — DEXTROSE 5 % IV SOLN
3.0000 g | INTRAVENOUS | Status: AC
  Administered 2013-05-26: 3 g via INTRAVENOUS
  Filled 2013-05-25: qty 3000

## 2013-05-26 ENCOUNTER — Ambulatory Visit (HOSPITAL_COMMUNITY)
Admission: RE | Admit: 2013-05-26 | Discharge: 2013-05-26 | Disposition: A | Discharge status: Home / Self Care | Payer: BC Managed Care – PPO | Source: Ambulatory Visit | Attending: Surgery | Admitting: Surgery

## 2013-05-26 ENCOUNTER — Encounter (HOSPITAL_COMMUNITY): Payer: BC Managed Care – PPO | Admitting: Anesthesiology

## 2013-05-26 ENCOUNTER — Encounter (HOSPITAL_COMMUNITY): Payer: Self-pay | Admitting: *Deleted

## 2013-05-26 ENCOUNTER — Ambulatory Visit (HOSPITAL_COMMUNITY): Payer: BC Managed Care – PPO | Admitting: Anesthesiology

## 2013-05-26 ENCOUNTER — Encounter (HOSPITAL_COMMUNITY)
Admission: RE | Disposition: A | Discharge status: Home / Self Care | Payer: Self-pay | Source: Ambulatory Visit | Attending: Surgery

## 2013-05-26 DIAGNOSIS — N400 Enlarged prostate without lower urinary tract symptoms: Secondary | ICD-10-CM | POA: Insufficient documentation

## 2013-05-26 DIAGNOSIS — E785 Hyperlipidemia, unspecified: Secondary | ICD-10-CM | POA: Insufficient documentation

## 2013-05-26 DIAGNOSIS — I10 Essential (primary) hypertension: Secondary | ICD-10-CM | POA: Insufficient documentation

## 2013-05-26 DIAGNOSIS — K4090 Unilateral inguinal hernia, without obstruction or gangrene, not specified as recurrent: Secondary | ICD-10-CM | POA: Insufficient documentation

## 2013-05-26 DIAGNOSIS — Z9889 Other specified postprocedural states: Secondary | ICD-10-CM

## 2013-05-26 DIAGNOSIS — Z87891 Personal history of nicotine dependence: Secondary | ICD-10-CM | POA: Insufficient documentation

## 2013-05-26 HISTORY — PX: INGUINAL HERNIA REPAIR: SHX194

## 2013-05-26 HISTORY — PX: INSERTION OF MESH: SHX5868

## 2013-05-26 SURGERY — REPAIR, HERNIA, INGUINAL, ADULT
Anesthesia: General | Site: Groin | Laterality: Right

## 2013-05-26 MED ORDER — 0.9 % SODIUM CHLORIDE (POUR BTL) OPTIME
TOPICAL | Status: DC | PRN
  Administered 2013-05-26: 1000 mL

## 2013-05-26 MED ORDER — OXYCODONE HCL 5 MG/5ML PO SOLN: 5.0000 mg | Freq: Once | ORAL | Status: AC | PRN

## 2013-05-26 MED ORDER — HYDROMORPHONE HCL PF 1 MG/ML IJ SOLN: 0.2500 mg | INTRAMUSCULAR | Status: DC | PRN

## 2013-05-26 MED ORDER — OXYCODONE-ACETAMINOPHEN 5-325 MG PO TABS: 1.0000 | ORAL_TABLET | ORAL | Status: DC | PRN

## 2013-05-26 MED ORDER — DEXTROSE 5 % IV SOLN
INTRAVENOUS | Status: DC | PRN
  Administered 2013-05-26: 10:00:00 via INTRAVENOUS

## 2013-05-26 MED ORDER — TRAMADOL HCL 50 MG PO TABS: 50.0000 mg | ORAL_TABLET | Freq: Four times a day (QID) | ORAL | Status: DC | PRN

## 2013-05-26 MED ORDER — METOCLOPRAMIDE HCL 5 MG/ML IJ SOLN: 10.0000 mg | Freq: Once | INTRAMUSCULAR | Status: DC | PRN

## 2013-05-26 MED ORDER — LACTATED RINGERS IV SOLN
INTRAVENOUS | Status: DC | PRN
  Administered 2013-05-26: 09:00:00 via INTRAVENOUS

## 2013-05-26 MED ORDER — DEXAMETHASONE SODIUM PHOSPHATE 4 MG/ML IJ SOLN
INTRAMUSCULAR | Status: DC | PRN
  Administered 2013-05-26: 4 mg via INTRAVENOUS

## 2013-05-26 MED ORDER — MIDAZOLAM HCL 2 MG/2ML IJ SOLN
INTRAMUSCULAR | Status: AC
  Filled 2013-05-26: qty 2

## 2013-05-26 MED ORDER — OXYCODONE HCL 5 MG PO TABS
ORAL_TABLET | ORAL | Status: AC
  Filled 2013-05-26: qty 1

## 2013-05-26 MED ORDER — EPHEDRINE SULFATE 50 MG/ML IJ SOLN
INTRAMUSCULAR | Status: DC | PRN
  Administered 2013-05-26 (×2): 10 mg via INTRAVENOUS

## 2013-05-26 MED ORDER — LACTATED RINGERS IV SOLN
INTRAVENOUS | Status: DC
  Administered 2013-05-26: 08:00:00 via INTRAVENOUS

## 2013-05-26 MED ORDER — BUPIVACAINE-EPINEPHRINE 0.25% -1:200000 IJ SOLN
INTRAMUSCULAR | Status: DC | PRN
  Administered 2013-05-26: 20 mL

## 2013-05-26 MED ORDER — PROPOFOL 10 MG/ML IV BOLUS
INTRAVENOUS | Status: DC | PRN
  Administered 2013-05-26: 200 mg via INTRAVENOUS

## 2013-05-26 MED ORDER — FENTANYL CITRATE 0.05 MG/ML IJ SOLN
INTRAMUSCULAR | Status: DC | PRN
  Administered 2013-05-26: 50 ug via INTRAVENOUS

## 2013-05-26 MED ORDER — FENTANYL CITRATE 0.05 MG/ML IJ SOLN
INTRAMUSCULAR | Status: AC
  Filled 2013-05-26: qty 2

## 2013-05-26 MED ORDER — ONDANSETRON HCL 4 MG/2ML IJ SOLN
INTRAMUSCULAR | Status: DC | PRN
  Administered 2013-05-26: 4 mg via INTRAVENOUS

## 2013-05-26 MED ORDER — LIDOCAINE HCL (CARDIAC) 20 MG/ML IV SOLN
INTRAVENOUS | Status: DC | PRN
  Administered 2013-05-26: 100 mg via INTRAVENOUS

## 2013-05-26 MED ORDER — OXYCODONE HCL 5 MG PO TABS
5.0000 mg | ORAL_TABLET | Freq: Once | ORAL | Status: AC | PRN
  Administered 2013-05-26: 5 mg via ORAL

## 2013-05-26 SURGICAL SUPPLY — 53 items
ADH SKN CLS APL DERMABOND .7 (GAUZE/BANDAGES/DRESSINGS) ×1
BLADE SURG 10 STRL SS (BLADE) ×3 IMPLANT
BLADE SURG 15 STRL LF DISP TIS (BLADE) ×1 IMPLANT
BLADE SURG 15 STRL SS (BLADE) ×3
BLADE SURG ROTATE 9660 (MISCELLANEOUS) ×2 IMPLANT
CANISTER SUCTION 2500CC (MISCELLANEOUS) IMPLANT
CHLORAPREP W/TINT 26ML (MISCELLANEOUS) ×3 IMPLANT
COVER SURGICAL LIGHT HANDLE (MISCELLANEOUS) ×3 IMPLANT
DECANTER SPIKE VIAL GLASS SM (MISCELLANEOUS) ×1 IMPLANT
DERMABOND ADVANCED (GAUZE/BANDAGES/DRESSINGS) ×2
DERMABOND ADVANCED .7 DNX12 (GAUZE/BANDAGES/DRESSINGS) ×1 IMPLANT
DRAIN PENROSE 1/2X12 LTX STRL (WOUND CARE) ×2 IMPLANT
DRAPE LAPAROTOMY TRNSV 102X78 (DRAPE) ×3 IMPLANT
DRAPE UTILITY 15X26 W/TAPE STR (DRAPE) ×6 IMPLANT
ELECT CAUTERY BLADE 6.4 (BLADE) ×3 IMPLANT
ELECT REM PT RETURN 9FT ADLT (ELECTROSURGICAL) ×3
ELECTRODE REM PT RTRN 9FT ADLT (ELECTROSURGICAL) ×1 IMPLANT
GLOVE BIO SURGEON STRL SZ7.5 (GLOVE) ×2 IMPLANT
GLOVE BIO SURGEON STRL SZ8 (GLOVE) ×3 IMPLANT
GLOVE BIOGEL PI IND STRL 7.5 (GLOVE) IMPLANT
GLOVE BIOGEL PI IND STRL 8 (GLOVE) ×1 IMPLANT
GLOVE BIOGEL PI INDICATOR 7.5 (GLOVE) ×2
GLOVE BIOGEL PI INDICATOR 8 (GLOVE) ×2
GOWN STRL NON-REIN LRG LVL3 (GOWN DISPOSABLE) ×4 IMPLANT
GOWN STRL REIN XL XLG (GOWN DISPOSABLE) ×3 IMPLANT
KIT BASIN OR (CUSTOM PROCEDURE TRAY) ×3 IMPLANT
KIT ROOM TURNOVER OR (KITS) ×3 IMPLANT
MESH HERNIA SYS ULTRAPRO LRG (Mesh General) ×2 IMPLANT
NDL HYPO 25GX1X1/2 BEV (NEEDLE) ×1 IMPLANT
NEEDLE HYPO 25GX1X1/2 BEV (NEEDLE) ×3 IMPLANT
NS IRRIG 1000ML POUR BTL (IV SOLUTION) ×3 IMPLANT
PACK SURGICAL SETUP 50X90 (CUSTOM PROCEDURE TRAY) ×3 IMPLANT
PAD ARMBOARD 7.5X6 YLW CONV (MISCELLANEOUS) ×3 IMPLANT
PENCIL BUTTON HOLSTER BLD 10FT (ELECTRODE) ×3 IMPLANT
SPONGE LAP 18X18 X RAY DECT (DISPOSABLE) ×3 IMPLANT
SUT MNCRL AB 4-0 PS2 18 (SUTURE) ×3 IMPLANT
SUT NOVA 0 T19/GS 22DT (SUTURE) ×3 IMPLANT
SUT NOVA NAB DX-16 0-1 5-0 T12 (SUTURE) ×7 IMPLANT
SUT SILK 2 0 SH (SUTURE) IMPLANT
SUT VIC AB 0 CT1 27 (SUTURE) ×3
SUT VIC AB 0 CT1 27XBRD ANBCTR (SUTURE) IMPLANT
SUT VIC AB 2-0 SH 27 (SUTURE) ×3
SUT VIC AB 2-0 SH 27X BRD (SUTURE) ×1 IMPLANT
SUT VIC AB 3-0 SH 18 (SUTURE) ×3 IMPLANT
SUT VICRYL AB 3 0 TIES (SUTURE) ×3 IMPLANT
SYR BULB 3OZ (MISCELLANEOUS) IMPLANT
SYR CONTROL 10ML LL (SYRINGE) ×3 IMPLANT
TOWEL OR 17X24 6PK STRL BLUE (TOWEL DISPOSABLE) ×1 IMPLANT
TOWEL OR 17X26 10 PK STRL BLUE (TOWEL DISPOSABLE) ×3 IMPLANT
TUBE CONNECTING 12'X1/4 (SUCTIONS)
TUBE CONNECTING 12X1/4 (SUCTIONS) IMPLANT
WATER STERILE IRR 1000ML POUR (IV SOLUTION) IMPLANT
YANKAUER SUCT BULB TIP NO VENT (SUCTIONS) IMPLANT

## 2013-05-26 NOTE — Preoperative (Signed)
Beta Blockers   Reason not to administer Beta Blockers:Ziac 5 a.m.

## 2013-05-26 NOTE — Op Note (Signed)
Right Inguinal Hernia repair with mesh , Open, Procedure Note  Indications: The patient presented with a history of a right, reducible inguinal  hernia. The risk of hernia repair include bleeding,  Infection,   Recurrence of the hernia,  Mesh use, chronic pain,  Organ injury,  Bowel injury,  Bladder injury,   nerve injury with numbness around the incision,  Death,  and worsening of preexisting  medical problems.  The alternatives to surgery have been discussed as well..  Long term expectations of both operative and non operative treatments have been discussed.   The patient agrees to proceed.   Pre-operative Diagnosis: right reducible inguinal hernia  Post-operative Diagnosis: same  Surgeon: Harriette BouillonORNETT,Azelyn Batie A.   Assistants: OR staff  Anesthesia: General endotracheal anesthesia and Local anesthesia 0.25.% bupivacaine, with epinephrine  ASA Class: 2  Procedure Details  The patient was seen again in the Holding Room. The risks, benefits, complications, treatment options, and expected outcomes were discussed with the patient. The possibilities of reaction to medication, pulmonary aspiration, perforation of viscus, bleeding, recurrent infection, the need for additional procedures, and development of a complication requiring transfusion or further operation were discussed with the patient and/or family. There was concurrence with the proposed plan, and informed consent was obtained. The site of surgery was properly noted/marked. The patient was taken to the Operating Room, identified as [name] Hauobert L Maxson Jr., and the procedure verified as hernia repair. A Time Out was held and the above information confirmed.  The patient was placed in the supine position and underwent induction of anesthesia, the lower abdomen and groin was prepped and draped in the standard fashion, and 0.25% Marcaine with epinephrine was used to anesthetize the skin over the mid-portion of the inguinal canal. A transverse incision  was made. Dissection was carried through the soft tissue to expose the inguinal canal and inguinal ligament along its lower edge. The external oblique fascia was split along the course of its fibers, exposing the inguinal canal. The cord and nerve were looped using a Penrose drain and reflected out of the field. The indirect  defect was exposed and a piece of prolene hernia system ultrapro mesh was and placed into  the defect. Interupted 1-0 novafil and 0 vicryl  suture was then used  to repair the defect, with the suture being sewn from the pubic tubercle inferiorly and superiorly along the canal to a level just beyond the internal ring. The mesh was split to allow passage of the cord and nerve into the canal without entrapment. The contents were then returned to canal and the external oblique fashion was then closed in a continuous fashion using 3-0 Vicryl suture taking care not to cause entrapment. Scarpa's layer closed with 3 0 vicryl and 4 0 monocryl used to close the skin.  Dermabond used for dressing.  Instrument, sponge, and needle counts were correct prior to closure and at the conclusion of the case.  Findings: Hernia as above  Estimated Blood Loss: Minimal         Drains: None         Total IV Fluids: 800 mL         Specimens: none               Complications: None; patient tolerated the procedure well.         Disposition: PACU - hemodynamically stable.         Condition: stable

## 2013-05-26 NOTE — Anesthesia Postprocedure Evaluation (Signed)
Anesthesia Post Note  Patient: [name] Hauobert L Franek Jr.  Procedure(s) Performed: Procedure(s) (LRB): RIGHT INGUINAL HERNIA REPAIR  (Right) INSERTION OF MESH (Right)  Anesthesia type: General  Patient location: PACU  Post pain: Pain level controlled  Post assessment: Patient's Cardiovascular Status Stable  Last Vitals:  Filed Vitals:   05/26/13 1145  BP: 139/84  Pulse: 63  Temp:   Resp: 11    Post vital signs: Reviewed and stable  Level of consciousness: alert  Complications: No apparent anesthesia complications

## 2013-05-26 NOTE — Anesthesia Preprocedure Evaluation (Addendum)
Anesthesia Evaluation  Patient identified by MRN, date of birth, ID band Patient awake    Reviewed: Allergy & Precautions, H&P , NPO status , Patient's Chart, lab work & pertinent test results, reviewed documented beta blocker date and time   Airway Mallampati: II TM Distance: >3 FB Neck ROM: full    Dental  (+) Teeth Intact, Caps, Dental Advisory Given and Implants   Pulmonary former smoker,  breath sounds clear to auscultation        Cardiovascular hypertension, On Medications and On Home Beta Blockers Rhythm:regular     Neuro/Psych  Headaches, negative psych ROS   GI/Hepatic negative GI ROS, Neg liver ROS,   Endo/Other  negative endocrine ROS  Renal/GU Renal InsufficiencyRenal disease  negative genitourinary   Musculoskeletal   Abdominal   Peds  Hematology negative hematology ROS (+)   Anesthesia Other Findings See surgeon's H&P   Reproductive/Obstetrics negative OB ROS                         Anesthesia Physical Anesthesia Plan  ASA: II  Anesthesia Plan: General   Post-op Pain Management:    Induction: Intravenous  Airway Management Planned: LMA  Additional Equipment:   Intra-op Plan:   Post-operative Plan:   Informed Consent: I have reviewed the patients History and Physical, chart, labs and discussed the procedure including the risks, benefits and alternatives for the proposed anesthesia with the patient or authorized representative who has indicated his/her understanding and acceptance.   Dental Advisory Given  Plan Discussed with: CRNA and Surgeon  Anesthesia Plan Comments:         Anesthesia Quick Evaluation

## 2013-05-26 NOTE — Discharge Instructions (Signed)
[facility] _______[facility] ° °UMBILICAL OR INGUINAL HERNIA REPAIR: POST OP INSTRUCTIONS ° °Always review your discharge instruction sheet given to you by the facility where your surgery was performed. °IF YOU HAVE DISABILITY OR FAMILY LEAVE FORMS, YOU MUST BRING THEM TO THE OFFICE FOR PROCESSING.   °DO NOT GIVE THEM TO YOUR DOCTOR. ° °1. A  prescription for pain medication may be given to you upon discharge.  Take your pain medication as prescribed, if needed.  If narcotic pain medicine is not needed, then you may take acetaminophen (Tylenol) or ibuprofen (Advil) as needed. °2. Take your usually prescribed medications unless otherwise directed. °3. If you need a refill on your pain medication, please contact your pharmacy.  They will contact our office to request authorization. Prescriptions will not be filled after 5 pm or on week-ends. °4. You should follow a light diet the first 24 hours after arrival home, such as soup and crackers, etc.  Be sure to include lots of fluids daily.  Resume your normal diet the day after surgery. °5. Most patients will experience some swelling and bruising around the umbilicus or in the groin and scrotum.  Ice packs and reclining will help.  Swelling and bruising can take several days to resolve.  °6. It is common to experience some constipation if taking pain medication after surgery.  Increasing fluid intake and taking a stool softener (such as Colace) will usually help or prevent this problem from occurring.  A mild laxative (Milk of Magnesia or Miralax) should be taken according to package directions if there are no bowel movements after 48 hours. °7. Unless discharge instructions indicate otherwise, you may remove your bandages 24-48 hours after surgery, and you may shower at that time.  You may have steri-strips (small skin tapes) in place directly over the incision.  These strips should be left on the skin for 7-10 days.  If your surgeon used skin glue on the  incision, you may shower in 24 hours.  The glue will flake off over the next 2-3 weeks.  Any sutures or staples will be removed at the office during your follow-up visit. °8. ACTIVITIES:  You may resume regular (light) daily activities beginning the next day--such as daily self-care, walking, climbing stairs--gradually increasing activities as tolerated.  You may have sexual intercourse when it is comfortable.  Refrain from any heavy lifting or straining until approved by your doctor. °a. You may drive when you are no longer taking prescription pain medication, you can comfortably wear a seatbelt, and you can safely maneuver your car and apply brakes. °b. RETURN TO WORK:  __________________________________________________________ °9. You should see your doctor in the office for a follow-up appointment approximately 2-3 weeks after your surgery.  Make sure that you call for this appointment within a day or two after you arrive home to insure a convenient appointment time. °10. OTHER INSTRUCTIONS:  __________________________________________________________________________________________________________________________________________________________________________________________  °WHEN TO CALL YOUR DOCTOR: °1. Fever over 101.0 °2. Inability to urinate °3. Nausea and/or vomiting °4. Extreme swelling or bruising °5. Continued bleeding from incision. °6. Increased pain, redness, or drainage from the incision ° °The clinic staff is available to answer your questions during regular business hours.  Please don’t hesitate to call and ask to speak to one of the nurses for clinical concerns.  If you have a medical emergency, go to the nearest emergency room or call 911.  A surgeon from [facility] is always on call at the hospital ° ° °  [address], [address], [place], [place]  [address] ? ° [address] [address], [place], [place]   [address] °[phone] ? [phone] ? FAX [phone] °Web site:  [email or web address] ° °What to eat: ° °For your first meals, you should eat lightly; only small meals initially.  If you do not have nausea, you may eat larger meals.  Avoid spicy, greasy and heavy food.   ° °General Anesthesia, Adult, Care After  °Refer to this sheet in the next few weeks. These instructions provide you with information on caring for yourself after your procedure. Your health care provider may also give you more specific instructions. Your treatment has been planned according to current medical practices, but problems sometimes occur. Call your health care provider if you have any problems or questions after your procedure.  °WHAT TO EXPECT AFTER THE PROCEDURE  °After the procedure, it is typical to experience:  °Sleepiness.  °Nausea and vomiting. °HOME CARE INSTRUCTIONS  °For the first 24 hours after general anesthesia:  °Have a responsible person with you.  °Do not drive a car. If you are alone, do not take public transportation.  °Do not drink alcohol.  °Do not take medicine that has not been prescribed by your health care provider.  °Do not sign important papers or make important decisions.  °You may resume a normal diet and activities as directed by your health care provider.  °Change bandages (dressings) as directed.  °If you have questions or problems that seem related to general anesthesia, call the hospital and ask for the anesthetist or anesthesiologist on call. °SEEK MEDICAL CARE IF:  °You have nausea and vomiting that continue the day after anesthesia.  °You develop a rash. °SEEK IMMEDIATE MEDICAL CARE IF:  °You have difficulty breathing.  °You have chest pain.  °You have any allergic problems. °Document Released: 08/13/2000 Document Revised: 01/07/2013 Document Reviewed: 11/20/2012  °[employer]® Patient Information ©2014 [employer].  ° ° °

## 2013-05-26 NOTE — Interval H&P Note (Signed)
History and Physical Interval Note:  05/26/2013 8:49 AM  [name] Hauobert L Toto Jr.  has presented today for surgery, with the diagnosis of HERNIA  The various methods of treatment have been discussed with the patient and family. After consideration of risks, benefits and other options for treatment, the patient has consented to  Procedure(s): HERNIA REPAIR INGUINAL ADULT (Right) INSERTION OF MESH (Right) as a surgical intervention .  The patient's history has been reviewed, patient examined, no change in status, stable for surgery.  I have reviewed the patient's chart and labs.  Questions were answered to the patient's satisfaction.     Mariaguadalupe Fialkowski A.

## 2013-05-26 NOTE — Transfer of Care (Signed)
Immediate Anesthesia Transfer of Care Note  Patient: [name] Hauobert L Mcfate Jr.  Procedure(s) Performed: Procedure(s): RIGHT INGUINAL HERNIA REPAIR  (Right) INSERTION OF MESH (Right)  Patient Location: PACU  Anesthesia Type:General  Level of Consciousness: awake, alert  and patient cooperative  Airway & Oxygen Therapy: Patient Spontanous Breathing and Patient connected to nasal cannula oxygen  Post-op Assessment: Report given to PACU RN, Post -op Vital signs reviewed and stable and Patient moving all extremities  Post vital signs: Reviewed and stable  Complications: No apparent anesthesia complications

## 2013-05-26 NOTE — Anesthesia Procedure Notes (Addendum)
Procedure Name: LMA Insertion Date/Time: 05/26/2013 9:30 AM Performed by: Darcey NoraJAMES, Sha Amer B Pre-anesthesia Checklist: Patient identified, Emergency Drugs available, Suction available and Patient being monitored Patient Re-evaluated:Patient Re-evaluated prior to inductionOxygen Delivery Method: Circle system utilized Preoxygenation: Pre-oxygenation with 100% oxygen Intubation Type: IV induction Ventilation: Mask ventilation without difficulty LMA: LMA inserted LMA Size: 5.0 Number of attempts: 1 (Dr. Gelene MinkFrederick) Airway Equipment and Method: Oral airway (rolled gauze b/t teeth) Placement Confirmation: breath sounds checked- equal and bilateral and positive ETCO2 Tube secured with: taped across cheeks. Dental Injury: Teeth and Oropharynx as per pre-operative assessment

## 2013-05-26 NOTE — H&P (Signed)
Transplants    None    Demographics [name] [name]. 73 year old male  Comm Pref:   [address]  [place] [address] [phone]) [phone](H) [phone] (W) Works at OTHER [employer]  Problem ListNone  Significant History/Details  Smoking: Former Smoker (Quit Date:05/21/1964), 1 ppd, 5 pack-years  Smokeless Tobacco: Never Used  Alcohol: No  No open orders  Preferred Language: English  Dialysis HistoryNone   Specialty CommentsEditShow AllReport09/12/2012:MAY RELEASE MEDICAL INFO TO AMELIA Kapur 02/06/1943 DOS 05/26/13 [facility]-OP- [facility] REP with mesh/[id number]/et 05/15/2013 patient scheduled for op surgery 05/26/2013 @ [facility] no precert required. (et,chm)    MedicationsLong-TermOutpatient Medications Hospital Medications    aspirin-sod bicarb-citric acid (ALKA-SELTZER) 325 MG TBEF tablet   bisoprolol-hydrochlorothiazide (ZIAC) 10-6.25 MG per tablet   lovastatin (MEVACOR) 40 MG tablet    Multiple Vitamin (MULTIVITAMIN) tablet    tamsulosin (FLOMAX) 0.4 MG CAPS capsule     Preferred Labs   None   Transplant-Related Biopsies (11 years) ** None **  Patient Blood Type (50 years)   None                                 Recent Visits (Maximum of 10 visits)Date Type Provider Description  05/26/2013 Surgery Shira Bobst A., MD   04/24/2013 Office Visit Harriette BouillonORNETT,[name] [name] A., MD Inguinal Hernia, Right (Primary Dx)  01/26/2013 Abstract Brennan BaileyMichelle Brooks, CMA   01/26/2013 Office Visit Feliz Herard A., MD Inguinal Hernia, Right (Primary Dx)         My Last Outpatient Progress NoteStatus Last Edited Encounter Date  Signed Fri Apr 24, 2013 10:49 AM EST 04/24/2013  Patient ID: [name] [name]., male   DOB: 1940/05/25, 73 y.o.   MRN: [id number]    Chief Complaint   Patient presents with   .  Routine Post Op       reck hernia      HPI [name] [name]. is a 73 y.o. male.  Is sent at the request of Dr. Donovan KailAllen  Ross  For a right inguinal hernia. This was  picked up on a routine physical exam. Patient denies any groin pain or any other problem with this area. He was unaware he had a hernia. He does some heavy lifting and has had no problems with this. Denies any nausea or vomiting. Denies any difficulty with bowel or bladder function. HPI    Past Medical History   Diagnosis  Date   .  Hypertension     .  Hyperlipidemia     .  BPH (benign prostatic hyperplasia)         Past Surgical History   Procedure  Laterality  Date   .  Appendectomy       .  Back surgery       .  Nasal septum surgery       .  Achilles tendon surgery       .  Right hand surgery           Family History   Problem  Relation  Age of Onset   .  Diabetes  Father     .  Hypertension  Father     .  Heart disease  Father     .  Uterine cancer  Mother     .  Hypertension  Mother        Social History History   Substance Use Topics   .  Smoking status:  Former Smoker       Quit date:  05/21/1964   .  Smokeless tobacco:  Never Used   .  Alcohol Use:  No      No Known Allergies    Current Outpatient Prescriptions   Medication  Sig  Dispense  Refill   .  bisoprolol-hydrochlorothiazide (ZIAC) 10-6.25 MG per tablet  Take 1 tablet by mouth daily.         Marland Kitchen  lovastatin (MEVACOR) 40 MG tablet  Take 40 mg by mouth at bedtime.         .  Multiple Vitamin (MULTIVITAMIN) tablet  Take 1 tablet by mouth daily.         .  tamsulosin (FLOMAX) 0.4 MG CAPS capsule  Take by mouth.             No current facility-administered medications for this visit.      Review of Systems Review of Systems  Constitutional: Negative for fever, chills and unexpected weight change.  HENT: Negative for hearing loss, congestion, sore throat, trouble swallowing and voice change.   Eyes: Negative for visual disturbance.  Respiratory: Negative for cough and wheezing.   Cardiovascular: Negative for chest pain, palpitations and leg swelling.  Gastrointestinal: Negative for nausea, vomiting,  abdominal pain, diarrhea, constipation, blood in stool, abdominal distention, anal bleeding and rectal pain.  Genitourinary: Negative for hematuria and difficulty urinating.  Musculoskeletal: Negative for arthralgias.  Skin: Negative for rash and wound.  Neurological: Negative for seizures, syncope, weakness and headaches.  Hematological: Negative for adenopathy. Does not bruise/bleed easily.  Psychiatric/Behavioral: Negative for confusion.      Blood pressure 140/78, pulse 68, temperature 98.7 F (37.1 C), temperature source Temporal, resp. rate 14, height 5\' 9"  (1.753 m), weight 203 lb 3.2 oz (92.171 kg).   Physical Exam Physical Exam  Constitutional: He is oriented to person, place, and time. He appears well-developed and well-nourished.  HENT:   Head: Normocephalic and atraumatic.  Eyes: Pupils are equal, round, and reactive to light.  Neck: Normal range of motion. Neck supple.  Cardiovascular: Normal rate and regular rhythm.   Pulmonary/Chest: Effort normal and breath sounds normal.  Abdominal: Soft. Bowel sounds are normal. There is no tenderness. A hernia is present. Hernia confirmed positive in the right inguinal area. Hernia confirmed negative in the left inguinal area.  Musculoskeletal: Normal range of motion.  Neurological: He is alert and oriented to person, place, and time.  Skin: Skin is warm and dry.  Psychiatric: He has a normal mood and affect. His behavior is normal. Judgment and thought content normal.          Assessment Right inguinal hernia reducible asymptomatic   Plan   Pt having more discomfort now and wishes repair. Will schedule for [employer] repair with mesh.The risk of hernia repair include bleeding,  Infection,   Recurrence of the hernia,  Mesh use, chronic pain,  Organ injury,  Bowel injury,  Bladder injury,   nerve injury with numbness around the incision,  Death,  and worsening of preexisting  medical problems. The alternatives to surgery have  been discussed as well..  Long term expectations of both operative and non operative treatments have been discussed.   The patient agrees to proceed.       Austyn Seier A. 04/24/2013, 10:46 AM

## 2013-05-27 ENCOUNTER — Encounter (HOSPITAL_COMMUNITY): Payer: Self-pay | Admitting: Surgery

## 2013-05-29 NOTE — Addendum Note (Signed)
Addendum created 05/29/13 1221 by Hart Robinsonsharles Tiyana Galla, MD   Modules edited: Anesthesia Attestations

## 2013-06-08 ENCOUNTER — Ambulatory Visit (INDEPENDENT_AMBULATORY_CARE_PROVIDER_SITE_OTHER): Payer: BC Managed Care – PPO | Admitting: Surgery

## 2013-06-08 ENCOUNTER — Encounter (INDEPENDENT_AMBULATORY_CARE_PROVIDER_SITE_OTHER): Payer: Self-pay | Admitting: Surgery

## 2013-06-08 VITALS — BP 130/78 | HR 64 | Temp 98.2°F | Resp 14 | Ht 69.0 in | Wt 209.0 lb

## 2013-06-08 DIAGNOSIS — Z9889 Other specified postprocedural states: Secondary | ICD-10-CM

## 2013-06-08 NOTE — Progress Notes (Signed)
Pt returns today after  Right inguinal hernia repair.  Pain is well controlled.  Bowels are functioning.  Wound is clean.  On exam:  Incision is clean /dry/intact.  Area is soft without signs of hernia recurrence.  Impression:  Status repair of hernia  Plan:  RTC PRN  Return to work 06/09/2013

## 2013-06-08 NOTE — Patient Instructions (Signed)
Return as needed. resume full activity

## 2014-05-11 ENCOUNTER — Other Ambulatory Visit: Payer: Self-pay | Admitting: Orthopedic Surgery

## 2014-05-11 DIAGNOSIS — M25531 Pain in right wrist: Secondary | ICD-10-CM

## 2014-05-19 ENCOUNTER — Other Ambulatory Visit: Payer: BC Managed Care – PPO

## 2014-05-28 ENCOUNTER — Ambulatory Visit
Admission: RE | Admit: 2014-05-28 | Discharge: 2014-05-28 | Disposition: A | Discharge status: Home / Self Care | Payer: BLUE CROSS/BLUE SHIELD | Source: Ambulatory Visit | Attending: Orthopedic Surgery | Admitting: Orthopedic Surgery

## 2014-05-28 ENCOUNTER — Ambulatory Visit
Admission: RE | Admit: 2014-05-28 | Discharge: 2014-05-28 | Disposition: A | Discharge status: Home / Self Care | Payer: Medicare Other | Source: Ambulatory Visit | Attending: Orthopedic Surgery | Admitting: Orthopedic Surgery

## 2014-05-28 DIAGNOSIS — M25531 Pain in right wrist: Secondary | ICD-10-CM

## 2014-05-28 MED ORDER — IOHEXOL 180 MG/ML  SOLN
3.0000 mL | Freq: Once | INTRAMUSCULAR | Status: AC | PRN
  Administered 2014-05-28: 3 mL via INTRA_ARTICULAR

## 2014-11-15 ENCOUNTER — Other Ambulatory Visit: Payer: Self-pay

## 2016-09-10 ENCOUNTER — Ambulatory Visit (INDEPENDENT_AMBULATORY_CARE_PROVIDER_SITE_OTHER): Payer: BLUE CROSS/BLUE SHIELD | Admitting: Orthopaedic Surgery

## 2016-09-10 ENCOUNTER — Ambulatory Visit (INDEPENDENT_AMBULATORY_CARE_PROVIDER_SITE_OTHER): Payer: Self-pay

## 2016-09-10 DIAGNOSIS — M25561 Pain in right knee: Secondary | ICD-10-CM | POA: Insufficient documentation

## 2016-09-10 MED ORDER — LIDOCAINE HCL 1 % IJ SOLN
3.0000 mL | INTRAMUSCULAR | Status: AC | PRN
  Administered 2016-09-10: 3 mL

## 2016-09-10 MED ORDER — METHYLPREDNISOLONE ACETATE 40 MG/ML IJ SUSP
40.0000 mg | INTRAMUSCULAR | Status: AC | PRN
  Administered 2016-09-10: 40 mg via INTRA_ARTICULAR

## 2016-09-10 NOTE — Progress Notes (Signed)
Office Visit Note   Patient: [name] [name].           Date of Birth: 06-24-1940           MRN: [id number] Visit Date: 09/10/2016              Requested by: Daisy Floro, MD [address] [place], [place] [address] PCP: Duane Lope, MD   Assessment & Plan: Visit Diagnoses:  1. Acute pain of right knee     Plan: I'm going to have him work on quad strengthening exercises. He tolerated the injection well. I'll see him back in about 3 weeks to see if this is clear things up versus trying some other type of modality.  Follow-Up Instructions: Return in about 3 weeks (around 10/01/2016).   Orders:  Orders Placed This Encounter  Procedures  . Large Joint Injection/Arthrocentesis  . XR Knee 1-2 Views Right   No orders of the defined types were placed in this encounter.     Procedures: Large Joint Inj Date/Time: 09/10/2016 2:19 PM Performed by: Kathryne Hitch Authorized by: Kathryne Hitch   Location:  Knee Site:  R knee Ultrasound Guidance: No   Fluoroscopic Guidance: No   Arthrogram: No   Medications:  3 mL lidocaine 1 %; 40 mg methylPREDNISolone acetate 40 MG/ML     Clinical Data: No additional findings.   Subjective: No chief complaint on file. The patient comes in with chief complaint of right knee pain and weakness. He had a remote history about 12 years ago or so of a right knee arthroscopy with partial medial meniscectomy. He says sometimes the knee feels like it's going to give out on him. He stands a lot and 76 years old and works as a Electrical engineer at [employer]. He denies any specific locking catching but says the knee aches and when he gets up from a seated position and sometimes again feels as if it's not stable. He denies any recent injuries.  HPI  Review of Systems He denies any headache, shortness of breath, fever, chills, nausea, vomiting.  Objective: Vital Signs: There were no vitals taken for this  visit.  Physical Exam He is alert and 3 in no acute distress Ortho Exam Examination of the right knee shows a slight varus malalignment of his only slight. There is no knee joint effusion. He has slight medial joint line tenderness but also pain of the medial gastroc area. The knee feels ligaments stable. Specialty Comments:  No specialty comments available.  Imaging: Xr Knee 1-2 Views Right  Result Date: 09/10/2016 2 views of the right knee show well-maintained joint space with slight varus malalignment. There is mild patellofemoral arthritic changes and mild medial compartment arthritic changes.    PMFS History: Patient Active Problem List   Diagnosis Date Noted  . Acute pain of right knee 09/10/2016   Past Medical History:  Diagnosis Date  . BPH (benign prostatic hyperplasia)   . Chronic kidney disease    kidney stones  . Headache(784.0)    migraines  . Hyperlipidemia   . Hypertension     Family History  Problem Relation Age of Onset  . Diabetes Father   . Hypertension Father   . Heart disease Father   . Uterine cancer Mother   . Hypertension Mother     Past Surgical History:  Procedure Laterality Date  . ACHILLES TENDON SURGERY    . APPENDECTOMY    . BACK SURGERY    .  COLONOSCOPY    . HERNIA REPAIR    . INGUINAL HERNIA REPAIR Right 05/26/2013   Procedure: RIGHT INGUINAL HERNIA REPAIR ;  Surgeon: Clovis Pu. Cornett, MD;  Location: [facility];  Service: General;  Laterality: Right;  . INSERTION OF MESH Right 05/26/2013   Procedure: INSERTION OF MESH;  Surgeon: Clovis Pu. Cornett, MD;  Location: [facility] OR;  Service: General;  Laterality: Right;  . NASAL SEPTUM SURGERY    . right hand surgery    . TONSILLECTOMY     Social History   Occupational History  . Not on file.   Social History Main Topics  . Smoking status: Former Smoker    Packs/day: 1.00    Years: 5.00    Types: Cigarettes    Quit date: 05/21/1964  . Smokeless tobacco: Never Used  . Alcohol use No  . Drug  use: No  . Sexual activity: Not on file

## 2016-10-02 ENCOUNTER — Encounter (INDEPENDENT_AMBULATORY_CARE_PROVIDER_SITE_OTHER): Payer: Self-pay | Admitting: Orthopaedic Surgery

## 2016-10-02 ENCOUNTER — Ambulatory Visit (INDEPENDENT_AMBULATORY_CARE_PROVIDER_SITE_OTHER): Payer: BLUE CROSS/BLUE SHIELD | Admitting: Physician Assistant

## 2016-10-02 DIAGNOSIS — M25561 Pain in right knee: Secondary | ICD-10-CM | POA: Diagnosis not present

## 2016-10-02 NOTE — Progress Notes (Signed)
[name] [name] returns today follow-up of his right knee status post injection on 09/10/2016. He states the injections seemed to help. He is having no mechanical symptoms of the knee. He is working on Dance movement psychotherapistquad strengthening exercises. He also walks at least a half a mile description her exercises week.  Physical exam Gen.: Well-developed well-nourished male in no acute distress. Affect appropriate. Psychiatric alert and oriented 3. Right knee has full extension and full flexion. No instability valgus varus stressing no effusion no abnormal warmth erythema. Nontender about the knee today.  Plan: Continue to work on quad strengthening exercises. Talked about also knee friendly exercises i.e. elliptical, swimming and bike. He'll follow up with us on an as-needed basis. Questions encouraged and answered

## 2017-04-10 ENCOUNTER — Ambulatory Visit (INDEPENDENT_AMBULATORY_CARE_PROVIDER_SITE_OTHER): Payer: BLUE CROSS/BLUE SHIELD | Admitting: Physician Assistant

## 2017-04-10 ENCOUNTER — Encounter (INDEPENDENT_AMBULATORY_CARE_PROVIDER_SITE_OTHER): Payer: Self-pay | Admitting: Physician Assistant

## 2017-04-10 VITALS — Ht 69.0 in | Wt 209.0 lb

## 2017-04-10 DIAGNOSIS — M25561 Pain in right knee: Secondary | ICD-10-CM

## 2017-04-10 MED ORDER — METHYLPREDNISOLONE ACETATE 40 MG/ML IJ SUSP
40.0000 mg | INTRAMUSCULAR | Status: AC | PRN
  Administered 2017-04-10: 40 mg via INTRA_ARTICULAR

## 2017-04-10 MED ORDER — LIDOCAINE HCL 1 % IJ SOLN
3.0000 mL | INTRAMUSCULAR | Status: AC | PRN
  Administered 2017-04-10: 3 mL

## 2017-04-10 NOTE — Progress Notes (Signed)
   Procedure Note  Patient: [name] HatchetR Sommerfield Jr.             Date of Birth: May 12, 1941           MRN: [id number]             Visit Date: 04/10/2017  HPI Mr. [name], Dr. Magnus IvanBlackman service comes in today due to acute knee pain for last week.  States pain is been severe.  Was last seen earlier this year late spring at that time had undergone a cortisone injection in the knee is doing well.  States he has had some achiness in the knee but his pain is really became severe over the last week.  States pain is waking him up at night and his knees touch.  He denies any new injury to the knee.  He denies any swelling.  No true mechanical symptoms of the knee.  Physical exam: Right knee he has full extension full flexion.  No instability valgus varus stressing.  He has tenderness along the medial joint line.  No effusion abnormal warmth or erythema.  McMurray's is negative.   Procedures: Visit Diagnoses: Acute pain of right knee  Large Joint Inj: R knee on 04/10/2017 1:51 PM Indications: pain Details: 22 G 1.5 in needle, anterolateral approach  Arthrogram: No  Medications: 3 mL lidocaine 1 %; 40 mg methylPREDNISolone acetate 40 MG/ML Outcome: tolerated well, no immediate complications Procedure, treatment alternatives, risks and benefits explained, specific risks discussed. Consent was given by the patient. Immediately prior to procedure a time out was called to verify the correct patient, procedure, equipment, support staff and site/side marked as required. Patient was prepped and draped in the usual sterile fashion.     Plan: We will see him back in 2 weeks check his progress lack of.  If he fails to have good relief with the injection recommend MRI of the knee due to the fact that he had only mild changes on plain radiographs back in April.  Questions encouraged and answered.

## 2017-04-15 ENCOUNTER — Telehealth (INDEPENDENT_AMBULATORY_CARE_PROVIDER_SITE_OTHER): Payer: Self-pay | Admitting: Radiology

## 2017-04-15 DIAGNOSIS — M25561 Pain in right knee: Principal | ICD-10-CM

## 2017-04-15 DIAGNOSIS — G8929 Other chronic pain: Secondary | ICD-10-CM

## 2017-04-15 NOTE — Telephone Encounter (Signed)
Wife called, LM triage that patient's right knee is hurting still, that cortisone injection did not help.  He wants to have the MRI scan done.  There is an appt at [facility] and they can see pt today at [phone] if we can order it and fax order to them.  Not sure we can do this quickly, have to check insurance for auth.  Please advise if ok to go ahead and order MRI scan now?

## 2017-04-15 NOTE — Telephone Encounter (Signed)
Ok to get MRI of knee r/o meniscal tear

## 2017-04-16 NOTE — Telephone Encounter (Signed)
Order entered, IC wife and advised we are ordering MRI.  Patient can go anytime on 12/1, 12/3, 12/6 or 12/7.  Or after 530 in the evenings.  Wants to try [facility] [address] or [facility].

## 2017-04-16 NOTE — Addendum Note (Signed)
Addended by: Cherre HugerMAY, Kristopher Delk E on: 04/16/2017 12:05 PM   Modules accepted: Orders

## 2017-04-17 NOTE — Telephone Encounter (Signed)
MRI sched at [facility] tomorrow 11/29 at 7pm, arrive 630pm, IC wife and advised. I will do auth, fax with order to [facility] and update referral first thing in the AM.  

## 2017-04-18 NOTE — Telephone Encounter (Signed)
Order faxed, auth done.

## 2017-04-22 ENCOUNTER — Encounter (INDEPENDENT_AMBULATORY_CARE_PROVIDER_SITE_OTHER): Payer: Self-pay | Admitting: Physician Assistant

## 2017-04-22 ENCOUNTER — Ambulatory Visit (INDEPENDENT_AMBULATORY_CARE_PROVIDER_SITE_OTHER): Payer: BLUE CROSS/BLUE SHIELD | Admitting: Physician Assistant

## 2017-04-22 VITALS — Ht 68.0 in | Wt 215.0 lb

## 2017-04-22 DIAGNOSIS — S83241A Other tear of medial meniscus, current injury, right knee, initial encounter: Secondary | ICD-10-CM | POA: Diagnosis not present

## 2017-04-22 NOTE — Progress Notes (Signed)
Office Visit Note   Patient: [name] HatchetR Frisch Jr.           Date of Birth: 14-Nov-1940           MRN: [id number] Visit Date: 04/22/2017              Requested by: Daisy Florooss, Charles Alan, MD [address] [place], [place] [address] PCP: Daisy Florooss, Charles Alan, MD   Assessment & Plan: Visit Diagnoses:  1. Acute medial meniscus tear of right knee, initial encounter     Plan:  Due to the findings on the MRI and his continued pain and mechanical symptoms recommend right knee arthroscopy with partial medial meniscectomy.  Discussed with him risk benefits of knee arthroscopy.  Questions were encouraged and answered at length today.  He will follow-up 1 week postop  Follow-Up Instructions: Return in about 1 week (around 04/29/2017).   Orders:  No orders of the defined types were placed in this encounter.  No orders of the defined types were placed in this encounter.     Procedures: No procedures performed   Clinical Data: No additional findings.   Subjective: Chief Complaint  Patient presents with  . Right Knee - Pain, Follow-up    MRI review    HPI [name] returns today follow-up of his right knee after cortisone injection 04/10/2017 states he got some relief not a sharp shooting pains in the knee but had constant aching pain in the knee.  He underwent an MRI of the knee due to his giving way symptoms in the knee and is here today for follow-up.  He states the knee does give way on him at times still.  Most of his pain is medial aspect of the knee. MRI was reviewed with the patient.  The knee model is the used to demonstrate the anatomy.  MRI shows a complex tear the posterior horn medial meniscus.  Minimal patellofemoral and medial compartmental arthritic changes.  Review of Systems Please see HPI otherwise negative Objective: Vital Signs: Ht 5\' 8"  (1.727 m)   Wt 215 lb (97.5 kg)   BMI 32.69 kg/m   Physical Exam  Constitutional: He is oriented to person, place, and  time. He appears well-developed and well-nourished. No distress.  Pulmonary/Chest: Effort normal.  Neurological: He is alert and oriented to person, place, and time.  Skin: He is not diaphoretic.    Ortho Exam Right knee no effusion abnormal warmth erythema no instability.  He has tenderness along the posterior medial joint line.  Forced flexion of the right knee causes pain medial joint line region.  He lacks the last few degrees of full extension of the right knee compared to left. Specialty Comments:  No specialty comments available.  Imaging: No results found.   PMFS History: Patient Active Problem List   Diagnosis Date Noted  . Acute pain of right knee 09/10/2016   Past Medical History:  Diagnosis Date  . BPH (benign prostatic hyperplasia)   . Chronic kidney disease    kidney stones  . Headache(784.0)    migraines  . Hyperlipidemia   . Hypertension     Family History  Problem Relation Age of Onset  . Diabetes Father   . Hypertension Father   . Heart disease Father   . Uterine cancer Mother   . Hypertension Mother     Past Surgical History:  Procedure Laterality Date  . ACHILLES TENDON SURGERY    . APPENDECTOMY    . BACK SURGERY    .  COLONOSCOPY    . HERNIA REPAIR    . INGUINAL HERNIA REPAIR Right 05/26/2013   Procedure: RIGHT INGUINAL HERNIA REPAIR ;  Surgeon: Clovis Puhomas A. Cornett, MD;  Location: [facility] OR;  Service: General;  Laterality: Right;  . INSERTION OF MESH Right 05/26/2013   Procedure: INSERTION OF MESH;  Surgeon: Clovis Puhomas A. Cornett, MD;  Location: [facility] OR;  Service: General;  Laterality: Right;  . NASAL SEPTUM SURGERY    . right hand surgery    . TONSILLECTOMY     Social History   Occupational History  . Not on file  Tobacco Use  . Smoking status: Former Smoker    Packs/day: 1.00    Years: 5.00    Pack years: 5.00    Types: Cigarettes    Last attempt to quit: 05/21/1964    Years since quitting: 52.9  . Smokeless tobacco: Never Used  Substance and  Sexual Activity  . Alcohol use: No  . Drug use: No  . Sexual activity: Not on file

## 2017-05-09 ENCOUNTER — Telehealth (INDEPENDENT_AMBULATORY_CARE_PROVIDER_SITE_OTHER): Payer: Self-pay | Admitting: Orthopaedic Surgery

## 2017-05-09 NOTE — Telephone Encounter (Signed)
Please call.

## 2017-05-09 NOTE — Telephone Encounter (Signed)
Patient would like a return phone call to schedule his surgery.  CB#[phone].  Thank you.

## 2017-05-30 DIAGNOSIS — M23221 Derangement of posterior horn of medial meniscus due to old tear or injury, right knee: Secondary | ICD-10-CM | POA: Diagnosis not present

## 2017-06-06 ENCOUNTER — Encounter (INDEPENDENT_AMBULATORY_CARE_PROVIDER_SITE_OTHER): Payer: Self-pay | Admitting: Orthopaedic Surgery

## 2017-06-06 ENCOUNTER — Ambulatory Visit (INDEPENDENT_AMBULATORY_CARE_PROVIDER_SITE_OTHER): Payer: BLUE CROSS/BLUE SHIELD | Admitting: Orthopaedic Surgery

## 2017-06-06 DIAGNOSIS — Z9889 Other specified postprocedural states: Secondary | ICD-10-CM | POA: Insufficient documentation

## 2017-06-06 NOTE — Progress Notes (Signed)
The patient is following up status post a right knee arthroscopy.  This is just a week ago he is very active 77 year old fortunately found just a complex posterior horn medial meniscal tear with good looking cartilage in his knee.  He said he is doing fine overall.  On exam he is is is minimal pain on the posterior medial aspect of his right knee with good range of motion and only mild effusion.  His calf is soft.  At this point will continue increase his activities.  I would like to see him back in only 2 weeks though because he has been having some chronic back issues and would like us to take a look at this.  So in 2 weeks when he comes back I would like an AP and lateral of his lumbar spine.

## 2017-06-13 ENCOUNTER — Inpatient Hospital Stay (INDEPENDENT_AMBULATORY_CARE_PROVIDER_SITE_OTHER): Payer: BLUE CROSS/BLUE SHIELD | Admitting: Orthopaedic Surgery

## 2017-06-24 ENCOUNTER — Ambulatory Visit (INDEPENDENT_AMBULATORY_CARE_PROVIDER_SITE_OTHER): Payer: BLUE CROSS/BLUE SHIELD | Admitting: Orthopaedic Surgery

## 2017-06-24 ENCOUNTER — Encounter (INDEPENDENT_AMBULATORY_CARE_PROVIDER_SITE_OTHER): Payer: Self-pay | Admitting: Orthopaedic Surgery

## 2017-06-24 ENCOUNTER — Ambulatory Visit (INDEPENDENT_AMBULATORY_CARE_PROVIDER_SITE_OTHER): Payer: BLUE CROSS/BLUE SHIELD

## 2017-06-24 DIAGNOSIS — M545 Low back pain, unspecified: Secondary | ICD-10-CM | POA: Insufficient documentation

## 2017-06-24 DIAGNOSIS — G8929 Other chronic pain: Secondary | ICD-10-CM | POA: Diagnosis not present

## 2017-06-24 DIAGNOSIS — Z9889 Other specified postprocedural states: Secondary | ICD-10-CM

## 2017-06-24 NOTE — Progress Notes (Signed)
Office Visit Note   Patient: [name] HatchetR Blasco Jr.           Date of Birth: [birth year]           MRN: [id number] Visit Date: 06/24/2017              Requested by: Daisy Florooss, Charles Alan, MD [address] [place], [place] [address] PCP: Daisy Florooss, Charles Alan, MD   Assessment & Plan: Visit Diagnoses:  1. Chronic bilateral low back pain, with sciatica presence unspecified   2. 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  I did talk about trying a steroid injection his right knee and he is agreeable to this.  We will see him back in 2 weeks to go over the MRI of his lumbar spine to come up with the treatment plan.  Follow-Up Instructions: Return in about 2 years (around 06/25/2019).   Orders:  Orders Placed This Encounter  Procedures  . XR Lumbar Spine 2-3 Views   No orders of the defined types were placed in this encounter.     Procedures: No procedures performed   Clinical Data: No additional findings.   Subjective: Chief Complaint  Patient presents with  . Right Knee - Follow-up  . Lower Back - Pain  The patient is here for 2 different things.  He is less than a month out from a right knee arthroscopy where we found a large meniscal tear but decent cartilage in the knee.  He has had some chronic back pain issues and is been on his feet all day long it hurts in the evening.  [facility] said he just needed physical therapy.  He said physical therapy helped for a while but now it has gotten more bothers him quite a bit if he is been on his feet all day with some radicular symptoms down the right leg as well.  That is mainly low back pain.  Denies any change in bowel bladder function.  HPI  Review of Systems He denies any headache,  chest pain, shortness of breath, fever, chills, nausea, vomiting  Objective: Vital Signs: There were no vitals taken for this visit.  Physical Exam He is alert and oriented x3 and in no acute distress Ortho Exam Examination of his right knee shows no effusion at all.  He has well-healed surgical incisions with good range of motion overall.  He hurts a little bit of the medial joint line to be expected status post partial meniscectomy.  He has pain in the lowest aspect of his lumbar spine with flexion-extension lumbar spine with a negative straight leg raise bilaterally. Specialty Comments:  No specialty comments available.  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  There is degenerative bone spurs at multiple levels with spondylosis in the foramina and anterior as well as posterior.    PMFS History: Patient Active Problem List   Diagnosis Date Noted  . Chronic bilateral low back pain 06/24/2017  . Status post arthroscopy of right knee 06/06/2017  . Acute pain of right knee 09/10/2016   Past Medical History:  Diagnosis Date  . BPH (benign prostatic hyperplasia)   . Chronic kidney disease  kidney stones  . Headache(784.0)    migraines  . Hyperlipidemia   . Hypertension     Family History  Problem Relation Age of Onset  . Diabetes Father   . Hypertension Father   . Heart disease Father   . Uterine cancer Mother   . Hypertension Mother     Past Surgical History:  Procedure Laterality Date  . ACHILLES TENDON SURGERY    . APPENDECTOMY    . BACK SURGERY    . COLONOSCOPY    . HERNIA REPAIR    . INGUINAL HERNIA REPAIR Right 05/26/2013   Procedure: RIGHT INGUINAL HERNIA REPAIR ;  Surgeon: Clovis Pu. Cornett, MD;  Location: [facility] OR;  Service: General;  Laterality: Right;  . INSERTION OF MESH Right 05/26/2013   Procedure: INSERTION OF MESH;  Surgeon: Clovis Pu. Cornett, MD;  Location: [facility]  OR;  Service: General;  Laterality: Right;  . NASAL SEPTUM SURGERY    . right hand surgery    . TONSILLECTOMY     Social History   Occupational History  . Not on file  Tobacco Use  . Smoking status: Former Smoker    Packs/day: 1.00    Years: 5.00    Pack years: 5.00    Types: Cigarettes    Last attempt to quit: 05/21/1964    Years since quitting: 53.1  . Smokeless tobacco: Never Used  Substance and Sexual Activity  . Alcohol use: No  . Drug use: No  . Sexual activity: Not on file

## 2017-06-28 ENCOUNTER — Other Ambulatory Visit (INDEPENDENT_AMBULATORY_CARE_PROVIDER_SITE_OTHER): Payer: Self-pay

## 2017-06-28 DIAGNOSIS — M4807 Spinal stenosis, lumbosacral region: Secondary | ICD-10-CM

## 2017-07-05 ENCOUNTER — Encounter: Payer: Self-pay | Admitting: Orthopaedic Surgery

## 2017-07-08 ENCOUNTER — Ambulatory Visit (INDEPENDENT_AMBULATORY_CARE_PROVIDER_SITE_OTHER): Payer: BLUE CROSS/BLUE SHIELD | Admitting: Orthopaedic Surgery

## 2017-07-08 ENCOUNTER — Encounter (INDEPENDENT_AMBULATORY_CARE_PROVIDER_SITE_OTHER): Payer: Self-pay | Admitting: Orthopaedic Surgery

## 2017-07-08 DIAGNOSIS — M545 Low back pain, unspecified: Secondary | ICD-10-CM

## 2017-07-08 DIAGNOSIS — G8929 Other chronic pain: Secondary | ICD-10-CM

## 2017-07-08 DIAGNOSIS — Z9889 Other specified postprocedural states: Secondary | ICD-10-CM

## 2017-07-08 NOTE — Progress Notes (Signed)
The patient comes in today now over a month out from a right knee arthroscopy where we performed a partial medial meniscectomy.  We did provide a steroid injection in his knee and that helped quite a bit.  He is also following up after having an MRI of his lumbar spine.  This was done at [facility] study not have the report or the disc today.  His pain is back to his lower aspect of his lumbar spine that hurts with flexion extension at does not hurt every day but it does not have a radicular component either.  He denies any weakness in his bowel or extremities or numbness and tingling.  This is the lowest aspect of his lumbar spine.  On examination of his right knee he has no effusion with excellent range of motion the knee is stable.  Damage of his lumbar spine shows paraspinal muscle pain and midline pain with flexion extension and palpation.  He is got excellent strength in his bilateral lower extremities.  At this point we will set him up for likely bilateral facet injections of the lower aspect of his lumbar spine with Dr. Alvester MorinNewton.  I will see him back in 4 weeks see how he is doing overall.  All questions concerns were answered and addressed.

## 2017-07-09 ENCOUNTER — Other Ambulatory Visit (INDEPENDENT_AMBULATORY_CARE_PROVIDER_SITE_OTHER): Payer: Self-pay

## 2017-07-09 DIAGNOSIS — M545 Low back pain: Principal | ICD-10-CM

## 2017-07-09 DIAGNOSIS — G8929 Other chronic pain: Secondary | ICD-10-CM

## 2017-07-23 ENCOUNTER — Ambulatory Visit (INDEPENDENT_AMBULATORY_CARE_PROVIDER_SITE_OTHER): Payer: Self-pay

## 2017-07-23 ENCOUNTER — Ambulatory Visit (INDEPENDENT_AMBULATORY_CARE_PROVIDER_SITE_OTHER): Payer: BLUE CROSS/BLUE SHIELD | Admitting: Physical Medicine and Rehabilitation

## 2017-07-23 ENCOUNTER — Encounter (INDEPENDENT_AMBULATORY_CARE_PROVIDER_SITE_OTHER): Payer: Self-pay | Admitting: Physical Medicine and Rehabilitation

## 2017-07-23 VITALS — BP 154/81 | HR 55 | Temp 97.7°F

## 2017-07-23 DIAGNOSIS — G8929 Other chronic pain: Secondary | ICD-10-CM | POA: Diagnosis not present

## 2017-07-23 DIAGNOSIS — M545 Low back pain: Secondary | ICD-10-CM

## 2017-07-23 DIAGNOSIS — M47816 Spondylosis without myelopathy or radiculopathy, lumbar region: Secondary | ICD-10-CM

## 2017-07-23 MED ORDER — METHYLPREDNISOLONE ACETATE 80 MG/ML IJ SUSP
80.0000 mg | Freq: Once | INTRAMUSCULAR | Status: AC
  Administered 2017-07-23: 80 mg

## 2017-07-23 NOTE — Patient Instructions (Signed)

## 2017-07-23 NOTE — Progress Notes (Signed)
.  Numeric Pain Rating Scale and Functional Assessment Average Pain 6   In the last MONTH (on 0-10 scale) has pain interfered with the following?  1. General activity like being  able to carry out your everyday physical activities such as walking, climbing stairs, carrying groceries, or moving a chair?  Rating(5)   +Driver, -BT, -Dye Allergies.  

## 2017-07-24 NOTE — Progress Notes (Signed)
[name] [name]. - 77 y.o. male MRN [id number]  Date of birth: [birth year]  Office Visit Note: Visit Date: 07/23/2017 PCP: Daisy Floro, MD Referred by: Daisy Floro, MD  Subjective: Chief Complaint  Patient presents with  . Lower Back - Pain   HPI: [name] [name] is a pleasant 77 year old gentleman with chronic worsening many year history of low back pain.  He reports axial low back pain around the L4-L5 region above the belt line.  He reports no specific injury.  He denies any radicular complaints down the legs or paresthesias.  He reports sitting makes the pain better and standing makes the pain worse.  He does not seem to report worsening claudication type symptoms.  He has been followed by Dr. Magnus Ivan for acute knee pain that started back in the early part of last year.  He has since undergone arthroscopic procedure.  They did obtain an MRI of his lumbar spine recently and this is reviewed below and reviewed with the patient.  He does have facet arthritis of the lower spine which is mild to moderate and he also has diffuse degenerative disc changes.  He also has moderate multifactorial central canal stenosis at L2-3.  Again that is really higher than where he points in terms of his back pain and is really not having any radicular symptoms.  There is a very trace retrolisthesis of L5 on S1.  Also of note as related by the radiologist there was a 1.5 cm T2 hyperintense lesion within the inferior right kidney with possible thin internal septation and they suggested follow-up renal ultrasound.  I did tell him that I would attach the report of the MRI to my note and that would be sent to his primary care physician.  I also told him that I would confirm with Dr. Magnus Ivan about whether they want to order that ultrasound to have this done by his primary care physician.  I did give a copy of the report to the patient and his wife.    ROS Otherwise per HPI.  Assessment & Plan: Visit  Diagnoses:  1. Spondylosis without myelopathy or radiculopathy, lumbar region   2. Chronic bilateral low back pain without sciatica     Plan: Findings:  Chronic worsening axial low back pain really centered over the L4 region.  Within a complete diagnostic and hopefully therapeutic bilateral L4-5 facet joint blocks.  Would consider block at L5-S1 versus epidural injection at L2-3.  His wife recently underwent lumbar decompression surgery and did show some comments with me about her surgery.  Hopefully he will do well he may be a candidate for radiofrequency ablation depending on the results.    Meds & Orders:  Meds ordered this encounter  Medications  . methylPREDNISolone acetate (DEPO-MEDROL) injection 80 mg    Orders Placed This Encounter  Procedures  . Facet Injection  . XR C-ARM NO REPORT    Follow-up: Return for Dr. Magnus Ivan.   Procedures: No procedures performed  Lumbar Facet Joint Intra-Articular Injection(s) with Fluoroscopic Guidance  Patient: [name] [name].      Date of Birth: [birth year] MRN: [id number] PCP: Daisy Floro, MD      Visit Date: 07/23/2017   Universal Protocol:    Date/Time: 07/23/2017  Consent Given By: the patient  Position: PRONE   Additional Comments: Vital signs were monitored before and after the procedure. Patient was prepped and draped in the usual sterile fashion. The correct patient, procedure, and site was verified.  Injection Procedure Details:  Procedure Site One Meds Administered:  Meds ordered this encounter  Medications  . methylPREDNISolone acetate (DEPO-MEDROL) injection 80 mg     Laterality: Bilateral  Location/Site:  L4-L5  Needle size: 22 guage  Needle type: Spinal  Needle Placement: Articular  Findings:  -Comments: Excellent flow of contrast producing a partial arthrogram.  Procedure Details: The fluoroscope beam is vertically oriented in AP, and the inferior recess is visualized beneath the lower  pole of the inferior apophyseal process, which represents the target point for needle insertion. When direct visualization is difficult the target point is located at the medial projection of the vertebral pedicle. The region overlying each aforementioned target is locally anesthetized with a 1 to 2 ml. volume of 1% Lidocaine without Epinephrine.   The spinal needle was inserted into each of the above mentioned facet joints using biplanar fluoroscopic guidance. A 0.25 to 0.5 ml. volume of Isovue-250 was injected and a partial facet joint arthrogram was obtained. A single spot film was obtained of the resulting arthrogram.    One to 1.25 ml of the steroid/anesthetic solution was then injected into each of the facet joints noted above.   Additional Comments:  The patient tolerated the procedure well Dressing: Band-Aid    Post-procedure details: Patient was observed during the procedure. Post-procedure instructions were reviewed.  Patient left the clinic in stable condition.     Clinical History: 07/08/2017 MRI lumbar spine:  INDICATION: Low back pain  TECHNIQUE: Sagittal and axial T1 and T2-weighted sequences were performed. Additional sagittal STIR images were performed.  COMPARISON: None available  FINDINGS:  Last well-formed disc space designated L5-S1 for the purposes of this report.  #  Vertebral bodies: No compression fracture. #  Alignment: Trace retrolisthesis of L5 on S1. #  Marrow signal: Degenerative endplate change at L2-L3 through L5-S1. #  Conus medullaris: Normal. Terminates at T12-L1 with no acute abnormality.  #  Lower thoracic segments: No significant abnormality. #  1.5 cm T2 hyperintense lesion within the inferior right kidney with possible internal septation.  #  No acute paraspinal soft tissue abnormality.  #  L1-2: Disc bulge eccentric to the right. Mild right foraminal stenosis. No significant central canal or left foraminal stenosis. #  L2-3: Diffuse disc  bulge and mild facet arthropathy with ligamentum flavum thickening. Moderate central canal stenosis. Mild to moderate bilateral foraminal stenosis. #  L3-4: Diffuse disc osteophyte bulge. Mild facet arthropathy. No significant central canal stenosis. Mild to moderate left and mild right foraminal stenosis. #  L4-5: Diffuse disc bulge. Mild facet arthropathy. Mild central canal stenosis with partial effacement of the lateral recesses. Mild to moderate left and moderate right foraminal stenosis. #  L5-S1: Diffuse disc osteophyte bulge with a small central disc protrusion. Mild facet arthropathy. No significant central canal stenosis. Mild left and moderate right foraminal stenosis.   IMPRESSION:  1. 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  2. Moderate central canal stenosis and mild to moderate bilateral foraminal stenosis at L2-L3. 3. Mild central canal stenosis with partial effacement of the lateral recesses and mild to moderate left/moderate right foraminal stenosis at L4-5. 4. Moderate right foraminal stenosis at L5-S1. 5. Mild to moderate left foraminal stenosis at L3-L4. 6. A 1.5 cm T2 hyperintense lesion within the inferior right kidney with possible thin internal septation, suggest a follow-up renal ultrasound for evaluation/characterization.  He reports that he quit smoking about 53 years ago. His smoking use included cigarettes. He  has a 5.00 pack-year smoking history. he has never used smokeless tobacco. No results for input(s): HGBA1C, LABURIC in the last 8760 hours.  Objective:  VS:  HT:    WT:   BMI:     BP:(!) 154/81  HR:(!) 55bpm  TEMP:97.7 F (36.5 C)(Oral)  RESP:98 % Physical Exam  Musculoskeletal:  They stand and ambulate with a forward flexed lumbar spine.  There is low back pain with extension of the lumbar spine.  There is good distal strength.    Ortho Exam Imaging: Xr C-arm No Report  Result Date: 07/23/2017 Please see  Notes or Procedures tab for imaging impression.   Past Medical/Family/Surgical/Social History: Medications & Allergies reviewed per EMR Patient Active Problem List   Diagnosis Date Noted  . Chronic bilateral low back pain 06/24/2017  . Status post arthroscopy of right knee 06/06/2017  . Acute pain of right knee 09/10/2016   Past Medical History:  Diagnosis Date  . BPH (benign prostatic hyperplasia)   . Chronic kidney disease    kidney stones  . Headache(784.0)    migraines  . Hyperlipidemia   . Hypertension    Family History  Problem Relation Age of Onset  . Diabetes Father   . Hypertension Father   . Heart disease Father   . Uterine cancer Mother   . Hypertension Mother    Past Surgical History:  Procedure Laterality Date  . ACHILLES TENDON SURGERY    . APPENDECTOMY    . BACK SURGERY    . COLONOSCOPY    . HERNIA REPAIR    . INGUINAL HERNIA REPAIR Right 05/26/2013   Procedure: RIGHT INGUINAL HERNIA REPAIR ;  Surgeon: Clovis Pu. Cornett, MD;  Location: [facility] OR;  Service: General;  Laterality: Right;  . INSERTION OF MESH Right 05/26/2013   Procedure: INSERTION OF MESH;  Surgeon: Clovis Pu. Cornett, MD;  Location: [facility] OR;  Service: General;  Laterality: Right;  . NASAL SEPTUM SURGERY    . right hand surgery    . TONSILLECTOMY     Social History   Occupational History  . Not on file  Tobacco Use  . Smoking status: Former Smoker    Packs/day: 1.00    Years: 5.00    Pack years: 5.00    Types: Cigarettes    Last attempt to quit: 05/21/1964    Years since quitting: 53.2  . Smokeless tobacco: Never Used  Substance and Sexual Activity  . Alcohol use: No  . Drug use: No  . Sexual activity: Not on file

## 2017-07-24 NOTE — Procedures (Signed)
Lumbar Facet Joint Intra-Articular Injection(s) with Fluoroscopic Guidance  Patient: [name] HatchetR Lenzo Jr.      Date of Birth: June 01, 1940 MRN: [id number] PCP: Daisy Florooss, Charles Alan, MD      Visit Date: 07/23/2017   Universal Protocol:    Date/Time: 07/23/2017  Consent Given By: the patient  Position: PRONE   Additional Comments: Vital signs were monitored before and after the procedure. Patient was prepped and draped in the usual sterile fashion. The correct patient, procedure, and site was verified.   Injection Procedure Details:  Procedure Site One Meds Administered:  Meds ordered this encounter  Medications  . methylPREDNISolone acetate (DEPO-MEDROL) injection 80 mg     Laterality: Bilateral  Location/Site:  L4-L5  Needle size: 22 guage  Needle type: Spinal  Needle Placement: Articular  Findings:  -Comments: Excellent flow of contrast producing a partial arthrogram.  Procedure Details: The fluoroscope beam is vertically oriented in AP, and the inferior recess is visualized beneath the lower pole of the inferior apophyseal process, which represents the target point for needle insertion. When direct visualization is difficult the target point is located at the medial projection of the vertebral pedicle. The region overlying each aforementioned target is locally anesthetized with a 1 to 2 ml. volume of 1% Lidocaine without Epinephrine.   The spinal needle was inserted into each of the above mentioned facet joints using biplanar fluoroscopic guidance. A 0.25 to 0.5 ml. volume of Isovue-250 was injected and a partial facet joint arthrogram was obtained. A single spot film was obtained of the resulting arthrogram.    One to 1.25 ml of the steroid/anesthetic solution was then injected into each of the facet joints noted above.   Additional Comments:  The patient tolerated the procedure well Dressing: Band-Aid    Post-procedure details: Patient was observed during the  procedure. Post-procedure instructions were reviewed.  Patient left the clinic in stable condition.

## 2017-08-12 ENCOUNTER — Encounter (INDEPENDENT_AMBULATORY_CARE_PROVIDER_SITE_OTHER): Payer: Self-pay | Admitting: Orthopaedic Surgery

## 2017-08-12 ENCOUNTER — Ambulatory Visit (INDEPENDENT_AMBULATORY_CARE_PROVIDER_SITE_OTHER): Payer: BLUE CROSS/BLUE SHIELD | Admitting: Orthopaedic Surgery

## 2017-08-12 DIAGNOSIS — M47817 Spondylosis without myelopathy or radiculopathy, lumbosacral region: Secondary | ICD-10-CM | POA: Diagnosis not present

## 2017-08-12 NOTE — Progress Notes (Signed)
[name] [name] returns today after having bilateral L4-L5 facet joint injections by Dr. Alvester MorinNewton.  He says the injections did quite well for him.  He is not 100% better but he feels significantly better overall.  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  He is not taking anything for pain at all.  He says his knee that we performed arthroscopic surgery on his done well.  On exam he has minimal pain in the lumbar spine with good flexion and extension.  He has great strength in bilateral lower extremities.  Neither knee is swollen.  At this point he will follow-up as needed.  I read Dr. Chesterville BlasNewton's note and he recommended the possibility of radiofrequency ablation in the future if needed.  If the patient does have recurrence in his back pain I have let him know he should call our office and get into see Dr. Alvester MorinNewton for repeat facet joint injections versus radiofrequency ablation.  All questions and concerns were answered and addressed.

## 2017-11-18 ENCOUNTER — Ambulatory Visit (INDEPENDENT_AMBULATORY_CARE_PROVIDER_SITE_OTHER): Payer: Medicare HMO

## 2017-11-18 ENCOUNTER — Ambulatory Visit (INDEPENDENT_AMBULATORY_CARE_PROVIDER_SITE_OTHER): Payer: Medicare HMO | Admitting: Orthopaedic Surgery

## 2017-11-18 ENCOUNTER — Encounter (INDEPENDENT_AMBULATORY_CARE_PROVIDER_SITE_OTHER): Payer: Self-pay | Admitting: Orthopaedic Surgery

## 2017-11-18 DIAGNOSIS — M25571 Pain in right ankle and joints of right foot: Secondary | ICD-10-CM

## 2017-11-18 NOTE — Progress Notes (Signed)
Office Visit Note   Patient: [name] HatchetR Scarpino Jr.           Date of Birth: 09/09/1940           MRN: [id number] Visit Date: 11/18/2017              Requested by: Daisy Florooss, Charles Alan, MD [address] [place], [place] [id number] PCP: Daisy Florooss, Charles Alan, MD   Assessment & Plan: Visit Diagnoses:  1. Pain in right ankle and joints of right foot     Plan: He understands that he still may have an injury to the posterior tibial tendon but given the fact that he can go up on his toes easily I am less concerned about this.  I do feel that he would benefit from an ASO for ankle support since he is walking on the side of his foot more.  Hopefully with time this will improve.  All questions and concerns were answered and addressed.  Follow-up will be as needed.  Follow-Up Instructions: Return if symptoms worsen or fail to improve.   Orders:  Orders Placed This Encounter  Procedures  . XR Ankle Complete Right   No orders of the defined types were placed in this encounter.     Procedures: No procedures performed   Clinical Data: No additional findings.   Subjective: Chief Complaint  Patient presents with  . Right Ankle - Pain  The patient comes in today with chief complaint of right ankle pain.  He does work in the yard on an incline about 6 to 7 weeks ago when he twisted his ankle.  He points the medial aspect of his ankle source of his pain.  He is twisted his ankle over the years.  He describes pain around the Achilles but mainly medial in the ankle.  He denies any swelling.  He is not a diabetic.  He denies any neuropathy.  He denies any lateral pain as well.  The pain is causing him though to walk on the side of his foot walk with a limp.  HPI  Review of Systems He currently denies any headache, chest pain, shortness of breath, fever, chills, nausea, vomiting.  Objective: Vital Signs: There were no vitals taken for this visit.  Physical Exam Is alert and oriented x3 and in no  acute distress Ortho Exam Examination of his right ankle shows pain along the posterior tibial tendon but there is no dysfunction of the tendon.  He can perform a straight toe raise easily and go up on his heel and his toes.  There is no ligamentous instability his range of motion is full.  His Achilles is intact with a negative Thompson test his lateral aspect of his ankle is stable with no pain. Specialty Comments:  No specialty comments available.  Imaging: Xr Ankle Complete Right  Result Date: 11/18/2017 3 views of the right ankle show no acute findings.  The ankle joint is well located.  There is mild arthritic changes medially and laterally to suggest old sprains.    PMFS History: Patient Active Problem List   Diagnosis Date Noted  . Facet joint disease of lumbosacral region 08/12/2017  . Chronic bilateral low back pain 06/24/2017  . Status post arthroscopy of right knee 06/06/2017  . Acute pain of right knee 09/10/2016   Past Medical History:  Diagnosis Date  . BPH (benign prostatic hyperplasia)   . Chronic kidney disease    kidney stones  . Headache(784.0)  migraines  . Hyperlipidemia   . Hypertension     Family History  Problem Relation Age of Onset  . Diabetes Father   . Hypertension Father   . Heart disease Father   . Uterine cancer Mother   . Hypertension Mother     Past Surgical History:  Procedure Laterality Date  . ACHILLES TENDON SURGERY    . APPENDECTOMY    . BACK SURGERY    . COLONOSCOPY    . HERNIA REPAIR    . INGUINAL HERNIA REPAIR Right 05/26/2013   Procedure: RIGHT INGUINAL HERNIA REPAIR ;  Surgeon: Clovis Pu. Cornett, MD;  Location: [facility] OR;  Service: General;  Laterality: Right;  . INSERTION OF MESH Right 05/26/2013   Procedure: INSERTION OF MESH;  Surgeon: Clovis Pu. Cornett, MD;  Location: [facility] OR;  Service: General;  Laterality: Right;  . NASAL SEPTUM SURGERY    . right hand surgery    . TONSILLECTOMY     Social History   Occupational History    . Not on file  Tobacco Use  . Smoking status: Former Smoker    Packs/day: 1.00    Years: 5.00    Pack years: 5.00    Types: Cigarettes    Last attempt to quit: 05/21/1964    Years since quitting: 53.5  . Smokeless tobacco: Never Used  Substance and Sexual Activity  . Alcohol use: No  . Drug use: No  . Sexual activity: Not on file

## 2017-12-18 DIAGNOSIS — X32XXXD Exposure to sunlight, subsequent encounter: Secondary | ICD-10-CM | POA: Diagnosis not present

## 2017-12-18 DIAGNOSIS — L570 Actinic keratosis: Secondary | ICD-10-CM | POA: Diagnosis not present

## 2018-01-09 ENCOUNTER — Telehealth (INDEPENDENT_AMBULATORY_CARE_PROVIDER_SITE_OTHER): Payer: Self-pay | Admitting: Physical Medicine and Rehabilitation

## 2018-01-09 NOTE — Telephone Encounter (Signed)
If it helped then can repeat, if having in leg pain then L2-3 or L3-4 interlam

## 2018-01-10 NOTE — Telephone Encounter (Signed)
Patient got at least 50% relief from last facet injections. No leg pain. Needs auth for bilateral [id number].

## 2018-01-13 NOTE — Telephone Encounter (Signed)
Submitted prior auth to [employer] website, case is pending.

## 2018-01-14 NOTE — Telephone Encounter (Signed)
Auth # [id number] Effective dates 01/13/18-02/27/18  Pt scheduled 01/22/18 with driver.

## 2018-01-22 ENCOUNTER — Encounter (INDEPENDENT_AMBULATORY_CARE_PROVIDER_SITE_OTHER): Payer: Self-pay | Admitting: Physical Medicine and Rehabilitation

## 2018-01-22 ENCOUNTER — Ambulatory Visit (INDEPENDENT_AMBULATORY_CARE_PROVIDER_SITE_OTHER): Payer: Self-pay

## 2018-01-22 ENCOUNTER — Ambulatory Visit (INDEPENDENT_AMBULATORY_CARE_PROVIDER_SITE_OTHER): Payer: Medicare HMO | Admitting: Physical Medicine and Rehabilitation

## 2018-01-22 VITALS — BP 153/85 | HR 50 | Temp 97.9°F

## 2018-01-22 DIAGNOSIS — M47816 Spondylosis without myelopathy or radiculopathy, lumbar region: Secondary | ICD-10-CM

## 2018-01-22 MED ORDER — METHYLPREDNISOLONE ACETATE 80 MG/ML IJ SUSP
80.0000 mg | Freq: Once | INTRAMUSCULAR | Status: AC
  Administered 2018-01-22: 80 mg

## 2018-01-22 NOTE — Progress Notes (Signed)
 .  Numeric Pain Rating Scale and Functional Assessment Average Pain 8   In the last MONTH (on 0-10 scale) has pain interfered with the following?  1. General activity like being  able to carry out your everyday physical activities such as walking, climbing stairs, carrying groceries, or moving a chair?  Rating(5)   +Driver, -BT, -Dye Allergies.  

## 2018-01-22 NOTE — Patient Instructions (Signed)

## 2018-02-07 NOTE — Procedures (Signed)
Lumbar Diagnostic Facet Joint Nerve Block with Fluoroscopic Guidance   Patient: [name] HatchetR Student Jr.      Date of Birth: May 16, 1941 MRN: [id number] PCP: Daisy Florooss, Charles Alan, MD      Visit Date: 01/22/2018   Universal Protocol:    Date/Time: 09/20/196:11 AM  Consent Given By: the patient  Position: PRONE  Additional Comments: Vital signs were monitored before and after the procedure. Patient was prepped and draped in the usual sterile fashion. The correct patient, procedure, and site was verified.   Injection Procedure Details:  Procedure Site One Meds Administered:  Meds ordered this encounter  Medications  . methylPREDNISolone acetate (DEPO-MEDROL) injection 80 mg     Laterality: Bilateral  Location/Site:  L4-L5  Needle size: 22 ga.  Needle type:spinal  Needle Placement: Oblique pedical  Findings:   -Comments: There was excellent flow of contrast along the articular pillars without intravascular flow.  Procedure Details: The fluoroscope beam is vertically oriented in AP and then obliqued 15 to 20 degrees to the ipsilateral side of the desired nerve to achieve the "Scotty dog" appearance.  The skin over the target area of the junction of the superior articulating process and the transverse process (sacral ala if blocking the L5 dorsal rami) was locally anesthetized with a 1 ml volume of 1% Lidocaine without Epinephrine.  The spinal needle was inserted and advanced in a trajectory view down to the target.   After contact with periosteum and negative aspirate for blood and CSF, correct placement without intravascular or epidural spread was confirmed by injecting 0.5 ml. of Isovue-250.  A spot radiograph was obtained of this image.    Next, a 0.5 ml. volume of the injectate described above was injected. The needle was then redirected to the other facet joint nerves mentioned above if needed.  Prior to the procedure, the patient was given a Pain Diary which was completed for  baseline measurements.  After the procedure, the patient rated their pain every 30 minutes and will continue rating at this frequency for a total of 5 hours.  The patient has been asked to complete the Diary and return to us by mail, fax or hand delivered as soon as possible.   Additional Comments:  The patient tolerated the procedure well Dressing: Band-Aid    Post-procedure details: Patient was observed during the procedure. Post-procedure instructions were reviewed.  Patient left the clinic in stable condition.

## 2018-02-07 NOTE — Progress Notes (Signed)
[name] Hatchet Greathouse Jr. - 77 y.o. male MRN [id number]  Date of birth: 1940/12/30  Office Visit Note: Visit Date: 01/22/2018 PCP: Daisy Florooss, Charles Alan, MD Referred by: Daisy Florooss, Charles Alan, MD  Subjective: Chief Complaint  Patient presents with  . Lower Back - Pain   HPI: [name] is a pleasant 77 year old gentleman with chronic worsening many year history of low back pain.  He reports axial low back pain around the L4-L5 region above the belt line.  He reports no specific injury.  He denies any radicular complaints down the legs or paresthesias.  He reports sitting makes the pain better and standing makes the pain worse.  He does not seem to report worsening claudication type symptoms.  He has been followed by Dr. Magnus IvanBlackman for acute knee pain that started back in the early part of last year.  He has since undergone arthroscopic procedure.  Dr. Magnus IvanBlackman obtained and MRI of his lumbar spine and this is reviewed below and reviewed with the patient.  He does have facet arthritis of the lower spine which is mild to moderate and he also has diffuse degenerative disc changes.  He also has moderate multifactorial central canal stenosis at L2-3.  Again that is really higher than where he points in terms of his back pain and is really not having any radicular symptoms.  There is a very trace retrolisthesis of L5 on S1.  We completed bilateral L4-5 diagnostic facet joint blocks with good relief in March.  He reports a few week history of return of symptoms that are very consistent again with pain across the lower back.  No new injury or trauma.  We will repeat the facet joint block and look towards possible radiofrequency ablation.   ROS Otherwise per HPI.  Assessment & Plan: Visit Diagnoses:  1. Spondylosis without myelopathy or radiculopathy, lumbar region     Plan: No additional findings.   Meds & Orders:  Meds ordered this encounter  Medications  . methylPREDNISolone acetate (DEPO-MEDROL) injection 80  mg    Orders Placed This Encounter  Procedures  . Facet Injection  . XR C-ARM NO REPORT    Follow-up: Return if symptoms worsen or fail to improve, for Consider RFA.   Procedures: No procedures performed  Lumbar Diagnostic Facet Joint Nerve Block with Fluoroscopic Guidance   Patient: [name] HatchetR Haston Jr.      Date of Birth: 1940/12/30 MRN: [id number] PCP: Daisy Florooss, Charles Alan, MD      Visit Date: 01/22/2018   Universal Protocol:    Date/Time: 09/20/196:11 AM  Consent Given By: the patient  Position: PRONE  Additional Comments: Vital signs were monitored before and after the procedure. Patient was prepped and draped in the usual sterile fashion. The correct patient, procedure, and site was verified.   Injection Procedure Details:  Procedure Site One Meds Administered:  Meds ordered this encounter  Medications  . methylPREDNISolone acetate (DEPO-MEDROL) injection 80 mg     Laterality: Bilateral  Location/Site:  L4-L5  Needle size: 22 ga.  Needle type:spinal  Needle Placement: Oblique pedical  Findings:   -Comments: There was excellent flow of contrast along the articular pillars without intravascular flow.  Procedure Details: The fluoroscope beam is vertically oriented in AP and then obliqued 15 to 20 degrees to the ipsilateral side of the desired nerve to achieve the "Scotty dog" appearance.  The skin over the target area of the junction of the superior articulating process and the transverse process (sacral ala if blocking the  L5 dorsal rami) was locally anesthetized with a 1 ml volume of 1% Lidocaine without Epinephrine.  The spinal needle was inserted and advanced in a trajectory view down to the target.   After contact with periosteum and negative aspirate for blood and CSF, correct placement without intravascular or epidural spread was confirmed by injecting 0.5 ml. of Isovue-250.  A spot radiograph was obtained of this image.    Next, a 0.5 ml. volume of the  injectate described above was injected. The needle was then redirected to the other facet joint nerves mentioned above if needed.  Prior to the procedure, the patient was given a Pain Diary which was completed for baseline measurements.  After the procedure, the patient rated their pain every 30 minutes and will continue rating at this frequency for a total of 5 hours.  The patient has been asked to complete the Diary and return to [place] by mail, fax or hand delivered as soon as possible.   Additional Comments:  The patient tolerated the procedure well Dressing: Band-Aid    Post-procedure details: Patient was observed during the procedure. Post-procedure instructions were reviewed.  Patient left the clinic in stable condition.   Clinical History: 07/08/2017 MRI lumbar spine:  INDICATION: Low back pain  TECHNIQUE: Sagittal and axial T1 and T2-weighted sequences were performed. Additional sagittal STIR images were performed.  COMPARISON: None available  FINDINGS:  Last well-formed disc space designated L5-S1 for the purposes of this report.  #  Vertebral bodies: No compression fracture. #  Alignment: Trace retrolisthesis of L5 on S1. #  Marrow signal: Degenerative endplate change at L2-L3 through L5-S1. #  Conus medullaris: Normal. Terminates at T12-L1 with no acute abnormality.  #  Lower thoracic segments: No significant abnormality. #  1.5 cm T2 hyperintense lesion within the inferior right kidney with possible internal septation.  #  No acute paraspinal soft tissue abnormality.  #  L1-2: Disc bulge eccentric to the right. Mild right foraminal stenosis. No significant central canal or left foraminal stenosis. #  L2-3: Diffuse disc bulge and mild facet arthropathy with ligamentum flavum thickening. Moderate central canal stenosis. Mild to moderate bilateral foraminal stenosis. #  L3-4: Diffuse disc osteophyte bulge. Mild facet arthropathy. No significant central canal stenosis. Mild  to moderate left and mild right foraminal stenosis. #  L4-5: Diffuse disc bulge. Mild facet arthropathy. Mild central canal stenosis with partial effacement of the lateral recesses. Mild to moderate left and moderate right foraminal stenosis. #  L5-S1: Diffuse disc osteophyte bulge with a small central disc protrusion. Mild facet arthropathy. No significant central canal stenosis. Mild left and moderate right foraminal stenosis.   IMPRESSION:  1. 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  2. Moderate central canal stenosis and mild to moderate bilateral foraminal stenosis at L2-L3. 3. Mild central canal stenosis with partial effacement of the lateral recesses and mild to moderate left/moderate right foraminal stenosis at L4-5. 4. Moderate right foraminal stenosis at L5-S1. 5. Mild to moderate left foraminal stenosis at L3-L4. 6. A 1.5 cm T2 hyperintense lesion within the inferior right kidney with possible thin internal septation, suggest a follow-up renal ultrasound for evaluation/characterization.   He reports that he quit smoking about 53 years ago. His smoking use included cigarettes. He has a 5.00 pack-year smoking history. He has never used smokeless tobacco. No results for input(s): HGBA1C, LABURIC in the last 8760 hours.  Objective:  VS:  HT:    WT:   BMI:  BP:(!) 153/85  HR:(!) 50bpm  TEMP:97.9 F (36.6 C)(Oral)  RESP:99 % Physical Exam  Ortho Exam Imaging: No results found.  Past Medical/Family/Surgical/Social History: Medications & Allergies reviewed per EMR, new medications updated. Patient Active Problem List   Diagnosis Date Noted  . Facet joint disease of lumbosacral region 08/12/2017  . Chronic bilateral low back pain 06/24/2017  . Status post arthroscopy of right knee 06/06/2017  . Acute pain of right knee 09/10/2016   Past Medical History:  Diagnosis Date  . BPH (benign prostatic hyperplasia)   . Chronic kidney  disease    kidney stones  . Headache(784.0)    migraines  . Hyperlipidemia   . Hypertension    Family History  Problem Relation Age of Onset  . Diabetes Father   . Hypertension Father   . Heart disease Father   . Uterine cancer Mother   . Hypertension Mother    Past Surgical History:  Procedure Laterality Date  . ACHILLES TENDON SURGERY    . APPENDECTOMY    . BACK SURGERY    . COLONOSCOPY    . HERNIA REPAIR    . INGUINAL HERNIA REPAIR Right 05/26/2013   Procedure: RIGHT INGUINAL HERNIA REPAIR ;  Surgeon: Clovis Pu. Cornett, MD;  Location: [facility];  Service: General;  Laterality: Right;  . INSERTION OF MESH Right 05/26/2013   Procedure: INSERTION OF MESH;  Surgeon: Clovis Pu. Cornett, MD;  Location: [facility];  Service: General;  Laterality: Right;  . NASAL SEPTUM SURGERY    . right hand surgery    . TONSILLECTOMY     Social History   Occupational History  . Not on file  Tobacco Use  . Smoking status: Former Smoker    Packs/day: 1.00    Years: 5.00    Pack years: 5.00    Types: Cigarettes    Last attempt to quit: 05/21/1964    Years since quitting: 53.7  . Smokeless tobacco: Never Used  Substance and Sexual Activity  . Alcohol use: No  . Drug use: No  . Sexual activity: Not on file

## 2018-03-24 DIAGNOSIS — J209 Acute bronchitis, unspecified: Secondary | ICD-10-CM | POA: Diagnosis not present

## 2018-04-10 DIAGNOSIS — I10 Essential (primary) hypertension: Secondary | ICD-10-CM | POA: Diagnosis not present

## 2018-04-10 DIAGNOSIS — Z125 Encounter for screening for malignant neoplasm of prostate: Secondary | ICD-10-CM | POA: Diagnosis not present

## 2018-04-10 DIAGNOSIS — R972 Elevated prostate specific antigen [PSA]: Secondary | ICD-10-CM | POA: Diagnosis not present

## 2018-04-10 DIAGNOSIS — E7800 Pure hypercholesterolemia, unspecified: Secondary | ICD-10-CM | POA: Diagnosis not present

## 2018-04-15 DIAGNOSIS — E7800 Pure hypercholesterolemia, unspecified: Secondary | ICD-10-CM | POA: Diagnosis not present

## 2018-04-15 DIAGNOSIS — J019 Acute sinusitis, unspecified: Secondary | ICD-10-CM | POA: Diagnosis not present

## 2018-04-15 DIAGNOSIS — I10 Essential (primary) hypertension: Secondary | ICD-10-CM | POA: Diagnosis not present

## 2018-04-15 DIAGNOSIS — Z0000 Encounter for general adult medical examination without abnormal findings: Secondary | ICD-10-CM | POA: Diagnosis not present

## 2018-04-15 DIAGNOSIS — Z125 Encounter for screening for malignant neoplasm of prostate: Secondary | ICD-10-CM | POA: Diagnosis not present

## 2018-04-15 DIAGNOSIS — N400 Enlarged prostate without lower urinary tract symptoms: Secondary | ICD-10-CM | POA: Diagnosis not present

## 2018-04-15 DIAGNOSIS — R05 Cough: Secondary | ICD-10-CM | POA: Diagnosis not present

## 2018-04-16 ENCOUNTER — Other Ambulatory Visit: Payer: Self-pay | Admitting: Family Medicine

## 2018-04-16 DIAGNOSIS — Z87891 Personal history of nicotine dependence: Secondary | ICD-10-CM

## 2018-04-25 ENCOUNTER — Ambulatory Visit
Admission: RE | Admit: 2018-04-25 | Discharge: 2018-04-25 | Disposition: A | Discharge status: Home / Self Care | Payer: Medicare HMO | Source: Ambulatory Visit | Attending: Family Medicine | Admitting: Family Medicine

## 2018-04-25 DIAGNOSIS — Z87891 Personal history of nicotine dependence: Secondary | ICD-10-CM

## 2018-04-25 DIAGNOSIS — Z136 Encounter for screening for cardiovascular disorders: Secondary | ICD-10-CM | POA: Diagnosis not present

## 2018-07-18 DIAGNOSIS — Z1211 Encounter for screening for malignant neoplasm of colon: Secondary | ICD-10-CM | POA: Diagnosis not present

## 2018-12-09 DIAGNOSIS — H2513 Age-related nuclear cataract, bilateral: Secondary | ICD-10-CM | POA: Diagnosis not present

## 2018-12-16 DIAGNOSIS — L821 Other seborrheic keratosis: Secondary | ICD-10-CM | POA: Diagnosis not present

## 2018-12-16 DIAGNOSIS — X32XXXD Exposure to sunlight, subsequent encounter: Secondary | ICD-10-CM | POA: Diagnosis not present

## 2018-12-16 DIAGNOSIS — L570 Actinic keratosis: Secondary | ICD-10-CM | POA: Diagnosis not present

## 2019-04-25 DIAGNOSIS — Z20828 Contact with and (suspected) exposure to other viral communicable diseases: Secondary | ICD-10-CM | POA: Diagnosis not present

## 2019-05-11 ENCOUNTER — Other Ambulatory Visit: Payer: Self-pay

## 2019-05-11 ENCOUNTER — Ambulatory Visit (INDEPENDENT_AMBULATORY_CARE_PROVIDER_SITE_OTHER): Payer: Medicare HMO | Admitting: Orthopaedic Surgery

## 2019-05-11 ENCOUNTER — Ambulatory Visit (INDEPENDENT_AMBULATORY_CARE_PROVIDER_SITE_OTHER): Payer: Medicare HMO

## 2019-05-11 DIAGNOSIS — M25562 Pain in left knee: Secondary | ICD-10-CM

## 2019-05-11 NOTE — Progress Notes (Signed)
Office Visit Note   Patient: [name] [name].           Date of Birth: [birth year]           MRN: [id number] Visit Date: 05/11/2019              Requested by: Daisy Floro, MD [address] [place],  [place] [address] PCP: Daisy Floro, MD   Assessment & Plan: Visit Diagnoses:  1. Acute pain of left knee     Plan: Since he is doing better I have recommended only Voltaren gel to apply topically over the areas where he is painful.  He agrees with this treatment plan as well.  All questions and concerns were answered and addressed.  If it does not get better or is worsening in any way he will let us know.  I would certainly try a steroid taper and some therapy or even a knee brace if needed.  Follow-Up Instructions: Return if symptoms worsen or fail to improve.   Orders:  Orders Placed This Encounter  Procedures  . XR Knee 1-2 Views Left   No orders of the defined types were placed in this encounter.     Procedures: No procedures performed   Clinical Data: No additional findings.   Subjective: Chief Complaint  Patient presents with  . Left Knee - Pain  The patient comes in today for evaluation treatment of left knee pain is been hurting him for about 2 weeks now.  He points to the medial aspect of his knee but at the collateral ligament area and not of the knee joint itself.  He says he has had no known injury but certain movements are making the pain worse.  He had done a lot of work before he started hurting.  He said today it feels better than it did when he made the appointment a week ago.  He has never had surgery on that knee.  He has had no other issues with that knee before.  He is now diabetic.  He is very active at 78 years old.  HPI  Review of Systems He currently denies any headache, chest pain, shortness of breath, fever, chills, nausea, vomiting  Objective: Vital Signs: There were no vitals taken for this visit.  Physical Exam He is alert  and orient x3 and in no acute distress Ortho Exam Examination of his left knee shows that it is ligamentously stable.  There is no effusion.  His range of motion is full.  His pain is only over the medial collateral ligament but there is no instability of the knee on exam. Specialty Comments:  No specialty comments available.  Imaging: XR Knee 1-2 Views Left  Result Date: 05/11/2019 2 views of the left knee show no acute findings.  The joint space is very well-maintained.  There is no effusion.    PMFS History: Patient Active Problem List   Diagnosis Date Noted  . Facet joint disease of lumbosacral region 08/12/2017  . Chronic bilateral low back pain 06/24/2017  . Status post arthroscopy of right knee 06/06/2017  . Acute pain of right knee 09/10/2016   Past Medical History:  Diagnosis Date  . BPH (benign prostatic hyperplasia)   . Chronic kidney disease    kidney stones  . Headache(784.0)    migraines  . Hyperlipidemia   . Hypertension     Family History  Problem Relation Age of Onset  . Diabetes Father   .  Hypertension Father   . Heart disease Father   . Uterine cancer Mother   . Hypertension Mother     Past Surgical History:  Procedure Laterality Date  . ACHILLES TENDON SURGERY    . APPENDECTOMY    . BACK SURGERY    . COLONOSCOPY    . HERNIA REPAIR    . INGUINAL HERNIA REPAIR Right 05/26/2013   Procedure: RIGHT INGUINAL HERNIA REPAIR ;  Surgeon: Joyice Faster. Cornett, MD;  Location: [facility];  Service: General;  Laterality: Right;  . INSERTION OF MESH Right 05/26/2013   Procedure: INSERTION OF MESH;  Surgeon: Joyice Faster. Cornett, MD;  Location: [facility];  Service: General;  Laterality: Right;  . NASAL SEPTUM SURGERY    . right hand surgery    . TONSILLECTOMY     Social History   Occupational History  . Not on file  Tobacco Use  . Smoking status: Former Smoker    Packs/day: 1.00    Years: 5.00    Pack years: 5.00    Types: Cigarettes    Quit date: 05/21/1964    Years  since quitting: 55.0  . Smokeless tobacco: Never Used  Substance and Sexual Activity  . Alcohol use: No  . Drug use: No  . Sexual activity: Not on file

## 2019-09-07 ENCOUNTER — Ambulatory Visit: Payer: Self-pay

## 2019-09-07 ENCOUNTER — Encounter: Payer: Self-pay | Admitting: Physician Assistant

## 2019-09-07 ENCOUNTER — Other Ambulatory Visit: Payer: Self-pay

## 2019-09-07 ENCOUNTER — Ambulatory Visit (INDEPENDENT_AMBULATORY_CARE_PROVIDER_SITE_OTHER): Payer: Medicare Other | Admitting: Physician Assistant

## 2019-09-07 DIAGNOSIS — M545 Low back pain, unspecified: Secondary | ICD-10-CM

## 2019-09-07 NOTE — Addendum Note (Signed)
Addended by: Mardene Celeste B on: 09/07/2019 10:04 AM   Modules accepted: Orders

## 2019-09-07 NOTE — Progress Notes (Signed)
Office Visit Note   Patient: [name] [name].           Date of Birth: 04-25-1941           MRN: [id number] Visit Date: 09/07/2019              Requested by: Lawerance Cruel, [place],  [place] [address] PCP: Lawerance Cruel, MD   Assessment & Plan: Visit Diagnoses:  1. Left low back pain, unspecified chronicity, unspecified whether sciatica present     Plan: We will refer him back to Dr. Ernestina Patches for epidural steroid injection/facet of the lumbar spine.  He is given handouts on back exercises which are reviewed with him.  If his pain persist or becomes worse he will follow-up with [place] and most likely will obtain a new MRI.  Questions were encouraged and answered at length Follow-Up Instructions: Return if symptoms worsen or fail to improve.   Orders:  Orders Placed This Encounter  Procedures  . XR Lumbar Spine 2-3 Views   No orders of the defined types were placed in this encounter.     Procedures: No procedures performed   Clinical Data: No additional findings.   Subjective: Chief Complaint  Patient presents with  . Lower Back - Pain    HPI [name] comes in today due to low back pain.  He states been ongoing for the past 2 weeks.  Has been doing a lot more yard work he is actually feeling a low retaining wall using cement blocks.  He denies any radicular symptoms down either leg.  Has pain in the left lower lumbar spine region.  He states is very much like the pain has had in the past.  Chronic back pain for years.  Denies any bowel bladder dysfunction, waking pain or saddle anesthesia like symptoms.  States his pain is worse with standing.  Previous MRI of his lumbar spine in 2019 showed multilevel lumbar spondylosis with degenerative disc disease and facet arthropathy.  Moderate central canal stenosis at L2-3 and mild to moderate bilateral foraminal stenosis at L2-3.  Mild to moderate left and moderate right foraminal stenosis at L4-5  moderate right foraminal stenosis L5-S1.  Mild to moderate left foraminal stenosis at L3-4.  He has been doing some exercises for his back which he is shown in therapy in the past feels these are helpful.  He is also taking Aleve and using moist heat to his back.  He states he like to stay away from medications as much as possible.  Review of Systems Negative for fevers chills shortness of breath chest pain.  Please see HPI otherwise negative or noncontributory.  Objective: Vital Signs: There were no vitals taken for this visit.  Physical Exam Constitutional:      Appearance: He is not ill-appearing or diaphoretic.  Pulmonary:     Effort: Pulmonary effort is normal.  Neurological:     Mental Status: He is alert and oriented to person, place, and time.  Psychiatric:        Mood and Affect: Mood normal.     Ortho Exam Lumbar spine Limited flexion extension.  Tight hamstrings bilaterally.  Negative straight leg raise on the right positive on the left.  5 out of 5 strength throughout the lower extremities against resistance.  Dorsal pedal pulses are palpable bilaterally.  Sensation grossly intact bilateral feet to light touch. Specialty Comments:  No specialty comments available.  Imaging: XR Lumbar Spine 2-3  Views  Result Date: 09/07/2019 Lumbar spine 2 views: Significant arthritic changes throughout lumbar spine worse at L4 4 5 with loss of disc space and facet changes.  Also L5-S1 facet changes.  Loss of lordotic curvature.  No spondylolisthesis.  No acute fractures.    PMFS History: Patient Active Problem List   Diagnosis Date Noted  . Facet joint disease of lumbosacral region 08/12/2017  . Chronic bilateral low back pain 06/24/2017  . Status post arthroscopy of right knee 06/06/2017  . Acute pain of right knee 09/10/2016   Past Medical History:  Diagnosis Date  . BPH (benign prostatic hyperplasia)   . Chronic kidney disease    kidney stones  . Headache(784.0)     migraines  . Hyperlipidemia   . Hypertension     Family History  Problem Relation Age of Onset  . Diabetes Father   . Hypertension Father   . Heart disease Father   . Uterine cancer Mother   . Hypertension Mother     Past Surgical History:  Procedure Laterality Date  . ACHILLES TENDON SURGERY    . APPENDECTOMY    . BACK SURGERY    . COLONOSCOPY    . HERNIA REPAIR    . INGUINAL HERNIA REPAIR Right 05/26/2013   Procedure: RIGHT INGUINAL HERNIA REPAIR ;  Surgeon: Clovis Pu. Cornett, MD;  Location: [facility];  Service: General;  Laterality: Right;  . INSERTION OF MESH Right 05/26/2013   Procedure: INSERTION OF MESH;  Surgeon: Clovis Pu. Cornett, MD;  Location: [facility];  Service: General;  Laterality: Right;  . NASAL SEPTUM SURGERY    . right hand surgery    . TONSILLECTOMY     Social History   Occupational History  . Not on file  Tobacco Use  . Smoking status: Former Smoker    Packs/day: 1.00    Years: 5.00    Pack years: 5.00    Types: Cigarettes    Quit date: 05/21/1964    Years since quitting: 55.3  . Smokeless tobacco: Never Used  Substance and Sexual Activity  . Alcohol use: No  . Drug use: No  . Sexual activity: Not on file

## 2019-09-29 ENCOUNTER — Other Ambulatory Visit: Payer: Self-pay

## 2019-09-29 ENCOUNTER — Ambulatory Visit: Payer: Self-pay

## 2019-09-29 ENCOUNTER — Encounter: Payer: Self-pay | Admitting: Physical Medicine and Rehabilitation

## 2019-09-29 ENCOUNTER — Ambulatory Visit: Payer: Medicare Other | Admitting: Physical Medicine and Rehabilitation

## 2019-09-29 VITALS — BP 162/84 | HR 56

## 2019-09-29 DIAGNOSIS — M47816 Spondylosis without myelopathy or radiculopathy, lumbar region: Secondary | ICD-10-CM | POA: Diagnosis not present

## 2019-09-29 MED ORDER — METHYLPREDNISOLONE ACETATE 80 MG/ML IJ SUSP
80.0000 mg | Freq: Once | INTRAMUSCULAR | Status: AC
  Administered 2019-09-29: 80 mg

## 2019-09-29 NOTE — Progress Notes (Signed)
Pt states pain in the middle of the lower back slightly to the left side. Pt states pain started about 6-8 weeks ago. Bending for a long time makes pain worse. Stretching and moving around makes pain better.   .Numeric Pain Rating Scale and Functional Assessment Average Pain 8   In the last MONTH (on 0-10 scale) has pain interfered with the following?  1. General activity like being  able to carry out your everyday physical activities such as walking, climbing stairs, carrying groceries, or moving a chair?  Rating(9)   +Driver, -BT, -Dye Allergies.

## 2019-09-29 NOTE — Procedures (Signed)
Lumbar Diagnostic Facet Joint Nerve Block with Fluoroscopic Guidance   Patient: [name] [name].      Date of Birth: Jul 03, 1940 MRN: [id number] PCP: Daisy Floro, MD      Visit Date: 09/29/2019   Universal Protocol:    Date/Time: 05/11/212:09 PM  Consent Given By: the patient  Position: PRONE  Additional Comments: Vital signs were monitored before and after the procedure. Patient was prepped and draped in the usual sterile fashion. The correct patient, procedure, and site was verified.   Injection Procedure Details:  Procedure Site One Meds Administered:  Meds ordered this encounter  Medications  . methylPREDNISolone acetate (DEPO-MEDROL) injection 80 mg     Laterality: Bilateral  Location/Site:  L4-L5 L5-S1  Needle size: 22 ga.  Needle type:spinal  Needle Placement: Oblique pedical  Findings:   -Comments: There was excellent flow of contrast along the articular pillars without intravascular flow.  Procedure Details: The fluoroscope beam is vertically oriented in AP and then obliqued 15 to 20 degrees to the ipsilateral side of the desired nerve to achieve the "Scotty dog" appearance.  The skin over the target area of the junction of the superior articulating process and the transverse process (sacral ala if blocking the L5 dorsal rami) was locally anesthetized with a 1 ml volume of 1% Lidocaine without Epinephrine.  The spinal needle was inserted and advanced in a trajectory view down to the target.   After contact with periosteum and negative aspirate for blood and CSF, correct placement without intravascular or epidural spread was confirmed by injecting 0.5 ml. of Isovue-250.  A spot radiograph was obtained of this image.    Next, a 0.5 ml. volume of the injectate described above was injected. The needle was then redirected to the other facet joint nerves mentioned above if needed.  Prior to the procedure, the patient was given a Pain Diary which was completed  for baseline measurements.  After the procedure, the patient rated their pain every 30 minutes and will continue rating at this frequency for a total of 5 hours.  The patient has been asked to complete the Diary and return to [place] by mail, fax or hand delivered as soon as possible.   Additional Comments:  The patient tolerated the procedure well Dressing: 2 x 2 sterile gauze and Band-Aid    Post-procedure details: Patient was observed during the procedure. Post-procedure instructions were reviewed.  Patient left the clinic in stable condition.

## 2019-09-29 NOTE — Progress Notes (Signed)
[name] [name]. - 79 y.o. male MRN [id number]  Date of birth: [birth year]  Office Visit Note: Visit Date: 09/29/2019 PCP: Daisy Floro, MD Referred by: Daisy Floro, MD  Subjective: Chief Complaint  Patient presents with  . Lower Back - Pain   HPI:  [name]. is a 79 y.o. male who comes in today for planned Bilateral L4-L5, L5-S1 lumbar facet/medial branch block with fluoroscopic guidance.  The patient has failed conservative care including home exercise, medications, time and activity modification.  This injection will be diagnostic and hopefully therapeutic.  Please see requesting physician notes for further details and justification.  Exam shows concordant low back pain with facet joint loading and extension.  MRI reviewed once again shows mainly degenerative changes throughout the lower spine with facet arthropathy mild to moderate particular L4-5 L5-S1.  No severe stenosis he has some moderate narrowing higher up in the lumbar spine.  Biggest issue has been pain because he was in a forward flexed position doing landscaping job and he just had severe pain since that time.  Worse with prolonged sitting and standing.  Consider epidural injection if he is just not getting better from facet joint blocks.  Consider radiofrequency ablation as part of pain management program for more definitive care if he does get relief.  ROS Otherwise per HPI.  Assessment & Plan: Visit Diagnoses:  1. Spondylosis without myelopathy or radiculopathy, lumbar region     Plan: No additional findings.   Meds & Orders:  Meds ordered this encounter  Medications  . methylPREDNISolone acetate (DEPO-MEDROL) injection 80 mg    Orders Placed This Encounter  Procedures  . Facet Injection  . XR C-ARM NO REPORT    Follow-up: No follow-ups on file.   Procedures: No procedures performed  Lumbar Diagnostic Facet Joint Nerve Block with Fluoroscopic Guidance   Patient: [name] [name].      Date  of Birth: 05-07-1941 MRN: [id number] PCP: Daisy Floro, MD      Visit Date: 09/29/2019   Universal Protocol:    Date/Time: 05/11/212:09 PM  Consent Given By: the patient  Position: PRONE  Additional Comments: Vital signs were monitored before and after the procedure. Patient was prepped and draped in the usual sterile fashion. The correct patient, procedure, and site was verified.   Injection Procedure Details:  Procedure Site One Meds Administered:  Meds ordered this encounter  Medications  . methylPREDNISolone acetate (DEPO-MEDROL) injection 80 mg     Laterality: Bilateral  Location/Site:  L4-L5 L5-S1  Needle size: 22 ga.  Needle type:spinal  Needle Placement: Oblique pedical  Findings:   -Comments: There was excellent flow of contrast along the articular pillars without intravascular flow.  Procedure Details: The fluoroscope beam is vertically oriented in AP and then obliqued 15 to 20 degrees to the ipsilateral side of the desired nerve to achieve the "Scotty dog" appearance.  The skin over the target area of the junction of the superior articulating process and the transverse process (sacral ala if blocking the L5 dorsal rami) was locally anesthetized with a 1 ml volume of 1% Lidocaine without Epinephrine.  The spinal needle was inserted and advanced in a trajectory view down to the target.   After contact with periosteum and negative aspirate for blood and CSF, correct placement without intravascular or epidural spread was confirmed by injecting 0.5 ml. of Isovue-250.  A spot radiograph was obtained of this image.    Next, a 0.5 ml. volume of  the injectate described above was injected. The needle was then redirected to the other facet joint nerves mentioned above if needed.  Prior to the procedure, the patient was given a Pain Diary which was completed for baseline measurements.  After the procedure, the patient rated their pain every 30 minutes and will  continue rating at this frequency for a total of 5 hours.  The patient has been asked to complete the Diary and return to [place] by mail, fax or hand delivered as soon as possible.   Additional Comments:  The patient tolerated the procedure well Dressing: 2 x 2 sterile gauze and Band-Aid    Post-procedure details: Patient was observed during the procedure. Post-procedure instructions were reviewed.  Patient left the clinic in stable condition.    Clinical History: 07/08/2017 MRI lumbar spine:  INDICATION: Low back pain  TECHNIQUE: Sagittal and axial T1 and T2-weighted sequences were performed. Additional sagittal STIR images were performed.  COMPARISON: None available  FINDINGS:  Last well-formed disc space designated L5-S1 for the purposes of this report.  #  Vertebral bodies: No compression fracture. #  Alignment: Trace retrolisthesis of L5 on S1. #  Marrow signal: Degenerative endplate change at A4-Z6 through L5-S1. #  Conus medullaris: Normal. Terminates at T12-L1 with no acute abnormality.  #  Lower thoracic segments: No significant abnormality. #  1.5 cm T2 hyperintense lesion within the inferior right kidney with possible internal septation.  #  No acute paraspinal soft tissue abnormality.  #  L1-2: Disc bulge eccentric to the right. Mild right foraminal stenosis. No significant central canal or left foraminal stenosis. #  L2-3: Diffuse disc bulge and mild facet arthropathy with ligamentum flavum thickening. Moderate central canal stenosis. Mild to moderate bilateral foraminal stenosis. #  L3-4: Diffuse disc osteophyte bulge. Mild facet arthropathy. No significant central canal stenosis. Mild to moderate left and mild right foraminal stenosis. #  L4-5: Diffuse disc bulge. Mild facet arthropathy. Mild central canal stenosis with partial effacement of the lateral recesses. Mild to moderate left and moderate right foraminal stenosis. #  L5-S1: Diffuse disc osteophyte bulge with  a small central disc protrusion. Mild facet arthropathy. No significant central canal stenosis. Mild left and moderate right foraminal stenosis.   IMPRESSION:  1. 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  2. Moderate central canal stenosis and mild to moderate bilateral foraminal stenosis at L2-L3. 3. Mild central canal stenosis with partial effacement of the lateral recesses and mild to moderate left/moderate right foraminal stenosis at L4-5. 4. Moderate right foraminal stenosis at L5-S1. 5. Mild to moderate left foraminal stenosis at L3-L4. 6. A 1.5 cm T2 hyperintense lesion within the inferior right kidney with possible thin internal septation, suggest a follow-up renal ultrasound for evaluation/characterization.     Objective:  VS:  HT:    WT:   BMI:     BP:(!) 162/84  HR:(!) 56bpm  TEMP: ( )  RESP:  Physical Exam Constitutional:      General: He is not in acute distress.    Appearance: Normal appearance. He is not ill-appearing.  HENT:     Head: Normocephalic and atraumatic.     Right Ear: External ear normal.     Left Ear: External ear normal.  Eyes:     Extraocular Movements: Extraocular movements intact.  Cardiovascular:     Rate and Rhythm: Normal rate.     Pulses: Normal pulses.  Abdominal:     General: There is no distension.  Palpations: Abdomen is soft.  Musculoskeletal:        General: No tenderness or signs of injury.     Right lower leg: No edema.     Left lower leg: No edema.     Comments: Patient has good distal strength without clonus. Concordant pain with facet loading.  Skin:    Findings: No erythema or rash.  Neurological:     General: No focal deficit present.     Mental Status: He is alert and oriented to person, place, and time.     Sensory: No sensory deficit.     Motor: No weakness or abnormal muscle tone.     Coordination: Coordination normal.  Psychiatric:        Mood and Affect: Mood normal.         Behavior: Behavior normal.      Imaging: No results found.

## 2020-10-19 ENCOUNTER — Other Ambulatory Visit: Payer: Self-pay

## 2020-10-19 ENCOUNTER — Ambulatory Visit: Payer: Self-pay

## 2020-10-19 ENCOUNTER — Telehealth: Payer: Self-pay

## 2020-10-19 ENCOUNTER — Ambulatory Visit: Payer: Medicare Other | Admitting: Orthopaedic Surgery

## 2020-10-19 DIAGNOSIS — M25561 Pain in right knee: Secondary | ICD-10-CM | POA: Diagnosis not present

## 2020-10-19 DIAGNOSIS — M1711 Unilateral primary osteoarthritis, right knee: Secondary | ICD-10-CM | POA: Insufficient documentation

## 2020-10-19 DIAGNOSIS — G8929 Other chronic pain: Secondary | ICD-10-CM | POA: Diagnosis not present

## 2020-10-19 MED ORDER — LIDOCAINE HCL 1 % IJ SOLN
3.0000 mL | INTRAMUSCULAR | Status: AC | PRN
  Administered 2020-10-19: 3 mL

## 2020-10-19 MED ORDER — METHYLPREDNISOLONE ACETATE 40 MG/ML IJ SUSP
40.0000 mg | INTRAMUSCULAR | Status: AC | PRN
  Administered 2020-10-19: 40 mg via INTRA_ARTICULAR

## 2020-10-19 NOTE — Progress Notes (Signed)
Office Visit Note   Patient: [name] [name].           Date of Birth: 1941/02/14           MRN: [id number] Visit Date: 10/19/2020              Requested by: Daisy Floro, MD [address] [place],  [place] [address] PCP: Daisy Floro, MD   Assessment & Plan: Visit Diagnoses:  1. Chronic pain of right knee   2. Unilateral primary osteoarthritis, right knee     Plan: I did speak to the patient about trying a steroid injection in his right knee today and he agreed to this and tolerated it well.  I want him to work on quad strengthening exercises but also to avoid the treadmill.  He has already been trying Voltaren gel.  He is the perfect candidate for hyaluronic acid for his right knee to treat the pain from moderate osteoarthritis.  I described this to him and he agrees that if we can get this ordered for him to try this as well.  All question concerns were answered addressed.  We will be in touch once we hopefully have hyaluronic acid approved and ordered for his right knee.  Follow-Up Instructions: No follow-ups on file.   Orders:  Orders Placed This Encounter  Procedures  . Large Joint Inj  . XR Knee 1-2 Views Right   No orders of the defined types were placed in this encounter.     Procedures: Large Joint Inj: R knee on 10/19/2020 9:00 AM Indications: diagnostic evaluation and pain Details: 22 G 1.5 in needle, superolateral approach  Arthrogram: No  Medications: 3 mL lidocaine 1 %; 40 mg methylPREDNISolone acetate 40 MG/ML Outcome: tolerated well, no immediate complications Procedure, treatment alternatives, risks and benefits explained, specific risks discussed. Consent was given by the patient. Immediately prior to procedure a time out was called to verify the correct patient, procedure, equipment, support staff and site/side marked as required. Patient was prepped and draped in the usual sterile fashion.       Clinical Data: No additional  findings.   Subjective: Chief Complaint  Patient presents with  . Right Knee - Pain  The patient comes in today with chief complaint of right knee pain is been going on for several months now.  It is slowly getting worse.  He is 80 years old and very active.  He does walk several miles on the treadmill often.  He also rides an exercise bike.  He points the medial aspect of the right knee as a source of his pain.  Is been slowly getting worse.  He denies any swelling and there is no known injury.  He is not a diabetic.  HPI  Review of Systems There is currently listed no headache, chest pain, shortness of breath, fever, chills, nausea, vomiting  Objective: Vital Signs: There were no vitals taken for this visit.  Physical Exam He is alert and orient x3 and in no acute distress Ortho Exam Examination of his right knee shows varus malalignment.  There is significant medial joint line tenderness and some patellofemoral crepitation throughout the arc of motion.  The varus malalignment is correctable.  The knee is ligamentously stable. Specialty Comments:  No specialty comments available.  Imaging: XR Knee 1-2 Views Right  Result Date: 10/19/2020 2 views of the right knee show no acute findings.  There is moderate arthritis involving the medial compartment of the  knee and the patellofemoral joint with joint space narrowing and osteophytes.  There is varus malalignment.  There is no effusion.    PMFS History: Patient Active Problem List   Diagnosis Date Noted  . Unilateral primary osteoarthritis, right knee 10/19/2020  . Facet joint disease of lumbosacral region 08/12/2017  . Chronic bilateral low back pain 06/24/2017  . Status post arthroscopy of right knee 06/06/2017  . Acute pain of right knee 09/10/2016   Past Medical History:  Diagnosis Date  . BPH (benign prostatic hyperplasia)   . Chronic kidney disease    kidney stones  . Headache(784.0)    migraines  . Hyperlipidemia    . Hypertension     Family History  Problem Relation Age of Onset  . Diabetes Father   . Hypertension Father   . Heart disease Father   . Uterine cancer Mother   . Hypertension Mother     Past Surgical History:  Procedure Laterality Date  . ACHILLES TENDON SURGERY    . APPENDECTOMY    . BACK SURGERY    . COLONOSCOPY    . HERNIA REPAIR    . INGUINAL HERNIA REPAIR Right 05/26/2013   Procedure: RIGHT INGUINAL HERNIA REPAIR ;  Surgeon: Clovis Pu. Cornett, MD;  Location: [facility] OR;  Service: General;  Laterality: Right;  . INSERTION OF MESH Right 05/26/2013   Procedure: INSERTION OF MESH;  Surgeon: Clovis Pu. Cornett, MD;  Location: [facility] OR;  Service: General;  Laterality: Right;  . NASAL SEPTUM SURGERY    . right hand surgery    . TONSILLECTOMY     Social History   Occupational History  . Not on file  Tobacco Use  . Smoking status: Former Smoker    Packs/day: 1.00    Years: 5.00    Pack years: 5.00    Types: Cigarettes    Quit date: 05/21/1964    Years since quitting: 56.4  . Smokeless tobacco: Never Used  Substance and Sexual Activity  . Alcohol use: No  . Drug use: No  . Sexual activity: Not on file

## 2020-10-19 NOTE — Telephone Encounter (Signed)
Noted  

## 2020-10-19 NOTE — Telephone Encounter (Signed)
Right knee gel injection  

## 2020-11-18 ENCOUNTER — Telehealth: Payer: Self-pay

## 2020-11-18 NOTE — Telephone Encounter (Signed)
VOB submitted for Durolane, right knee. Pending BV. 

## 2020-11-23 ENCOUNTER — Telehealth: Payer: Self-pay

## 2020-11-23 NOTE — Telephone Encounter (Signed)
Approved for Durolane, right knee. Buy & Bill Patient will be responsible for 20% OOP. Co-pay of $30.00 No PA required  Appt.11/30/2020 with Dr. Magnus Ivan

## 2020-11-30 ENCOUNTER — Other Ambulatory Visit: Payer: Self-pay

## 2020-11-30 ENCOUNTER — Ambulatory Visit: Payer: Medicare Other | Admitting: Orthopaedic Surgery

## 2020-11-30 ENCOUNTER — Ambulatory Visit: Payer: Medicare Other | Admitting: Family Medicine

## 2020-11-30 DIAGNOSIS — M1711 Unilateral primary osteoarthritis, right knee: Secondary | ICD-10-CM

## 2020-11-30 NOTE — Progress Notes (Signed)
Subjective: He is here for right knee Durolane injection for osteoarthritis.  He did not get as much relief as usual from steroid injection.  Objective: No effusion, no warmth or erythema.  Procedure: Right knee injection: After sterile prep with Betadine, injected 3 cc 1% lidocaine without epinephrine and Durolane from lateral midpatellar approach.

## 2020-12-22 DIAGNOSIS — I10 Essential (primary) hypertension: Secondary | ICD-10-CM | POA: Diagnosis not present

## 2020-12-22 DIAGNOSIS — E7800 Pure hypercholesterolemia, unspecified: Secondary | ICD-10-CM | POA: Diagnosis not present

## 2020-12-29 DIAGNOSIS — Z0000 Encounter for general adult medical examination without abnormal findings: Secondary | ICD-10-CM | POA: Diagnosis not present

## 2020-12-29 DIAGNOSIS — I10 Essential (primary) hypertension: Secondary | ICD-10-CM | POA: Diagnosis not present

## 2020-12-29 DIAGNOSIS — N139 Obstructive and reflux uropathy, unspecified: Secondary | ICD-10-CM | POA: Diagnosis not present

## 2020-12-29 DIAGNOSIS — E7800 Pure hypercholesterolemia, unspecified: Secondary | ICD-10-CM | POA: Diagnosis not present

## 2021-02-06 DIAGNOSIS — L570 Actinic keratosis: Secondary | ICD-10-CM | POA: Diagnosis not present

## 2021-02-06 DIAGNOSIS — X32XXXD Exposure to sunlight, subsequent encounter: Secondary | ICD-10-CM | POA: Diagnosis not present

## 2021-02-13 DIAGNOSIS — R3915 Urgency of urination: Secondary | ICD-10-CM | POA: Diagnosis not present

## 2021-02-13 DIAGNOSIS — R3912 Poor urinary stream: Secondary | ICD-10-CM | POA: Diagnosis not present

## 2021-03-23 ENCOUNTER — Telehealth: Payer: Self-pay | Admitting: Physical Medicine and Rehabilitation

## 2021-03-23 DIAGNOSIS — M47816 Spondylosis without myelopathy or radiculopathy, lumbar region: Secondary | ICD-10-CM

## 2021-03-23 NOTE — Telephone Encounter (Signed)
Bilateral L4-5 and L5-S1 facets 09/29/19. Ok to repeat if helped, same problem/side, and no new injury vs OV.

## 2021-03-23 NOTE — Telephone Encounter (Signed)
Pt called requesting a call back to set an appt. Please call pt at [phone].

## 2021-03-24 NOTE — Telephone Encounter (Signed)
Left message #1

## 2021-04-04 ENCOUNTER — Ambulatory Visit: Payer: Self-pay

## 2021-04-04 ENCOUNTER — Other Ambulatory Visit: Payer: Self-pay

## 2021-04-04 ENCOUNTER — Encounter: Payer: Self-pay | Admitting: Physical Medicine and Rehabilitation

## 2021-04-04 ENCOUNTER — Ambulatory Visit (INDEPENDENT_AMBULATORY_CARE_PROVIDER_SITE_OTHER): Payer: Medicare Other | Admitting: Physical Medicine and Rehabilitation

## 2021-04-04 VITALS — BP 175/82 | HR 50

## 2021-04-04 DIAGNOSIS — M47816 Spondylosis without myelopathy or radiculopathy, lumbar region: Secondary | ICD-10-CM | POA: Diagnosis not present

## 2021-04-04 MED ORDER — METHYLPREDNISOLONE ACETATE 80 MG/ML IJ SUSP
80.0000 mg | Freq: Once | INTRAMUSCULAR | Status: AC
  Administered 2021-04-04: 09:00:00 80 mg

## 2021-04-04 NOTE — Progress Notes (Signed)
Pt state lower back pain. Pt state any movement makes the pain worse. Pt state he takes over the counter pain meds to help ease his pain.  Numeric Pain Rating Scale and Functional Assessment Average Pain 8   In the last MONTH (on 0-10 scale) has pain interfered with the following?  1. General activity like being  able to carry out your everyday physical activities such as walking, climbing stairs, carrying groceries, or moving a chair?  Rating(10)   +Driver, -BT, -Dye Allergies.

## 2021-04-04 NOTE — Patient Instructions (Signed)

## 2021-04-04 NOTE — Progress Notes (Signed)
[name] [name]. - 81 y.o. male MRN [id number]  Date of birth: [birth year]  Office Visit Note: Visit Date: 04/04/2021 PCP: Daisy Floro, MD Referred by: Daisy Floro, MD  Subjective: Chief Complaint  Patient presents with   Lower Back - Pain   HPI:  [name] Devere Brem. is a 80 y.o. male who comes in today for planned repeat Bilateral L4-5 and L5-S1 Lumbar facet/medial branch block with fluoroscopic guidance.  The patient has failed conservative care including home exercise, medications, time and activity modification.  This injection will be diagnostic and hopefully therapeutic.  Please see requesting physician notes for further details and justification.  Exam shows concordant low back pain with facet joint loading and extension. Patient received more than 80% pain relief from prior injection. This would be the second block in a diagnostic double block paradigm.     Referring:Dr. Doneen Poisson   ROS Otherwise per HPI.  Assessment & Plan: Visit Diagnoses:    ICD-10-CM   1. Spondylosis without myelopathy or radiculopathy, lumbar region  M47.816 XR C-ARM NO REPORT    Facet Injection    methylPREDNISolone acetate (DEPO-MEDROL) injection 80 mg      Plan: No additional findings.   Meds & Orders:  Meds ordered this encounter  Medications   methylPREDNISolone acetate (DEPO-MEDROL) injection 80 mg    Orders Placed This Encounter  Procedures   Facet Injection   XR C-ARM NO REPORT    Follow-up: Return for Review Pain Diary.   Procedures: No procedures performed  Lumbar Diagnostic Facet Joint Nerve Block with Fluoroscopic Guidance   Patient: [name] [name].      Date of Birth: [birth year] MRN: [id number] PCP: Daisy Floro, MD      Visit Date: 04/04/2021   Universal Protocol:    Date/Time: 11/28/228:37 PM  Consent Given By: the patient  Position: PRONE  Additional Comments: Vital signs were monitored before and after the procedure. Patient was  prepped and draped in the usual sterile fashion. The correct patient, procedure, and site was verified.   Injection Procedure Details:   Procedure diagnoses:  1. Spondylosis without myelopathy or radiculopathy, lumbar region      Meds Administered:  Meds ordered this encounter  Medications   methylPREDNISolone acetate (DEPO-MEDROL) injection 80 mg     Laterality: Bilateral  Location/Site: L4-L5, L3 and L4 medial branches and L5-S1, L4 medial branch and L5 dorsal ramus  Needle: 5.0 in., 25 ga.  Short bevel or Quincke spinal needle  Needle Placement: Oblique pedical  Findings:   -Comments: There was excellent flow of contrast along the articular pillars without intravascular flow.  Procedure Details: The fluoroscope beam is vertically oriented in AP and then obliqued 15 to 20 degrees to the ipsilateral side of the desired nerve to achieve the "Scotty dog" appearance.  The skin over the target area of the junction of the superior articulating process and the transverse process (sacral ala if blocking the L5 dorsal rami) was locally anesthetized with a 1 ml volume of 1% Lidocaine without Epinephrine.  The spinal needle was inserted and advanced in a trajectory view down to the target.   After contact with periosteum and negative aspirate for blood and CSF, correct placement without intravascular or epidural spread was confirmed by injecting 0.5 ml. of Isovue-250.  A spot radiograph was obtained of this image.    Next, a 0.5 ml. volume of the injectate described above was injected. The needle was then redirected to the other  facet joint nerves mentioned above if needed.  Prior to the procedure, the patient was given a Pain Diary which was completed for baseline measurements.  After the procedure, the patient rated their pain every 30 minutes and will continue rating at this frequency for a total of 5 hours.  The patient has been asked to complete the Diary and return to [place] by mail, fax  or hand delivered as soon as possible.   Additional Comments:  No complications occurred Dressing: 2 x 2 sterile gauze and Band-Aid    Post-procedure details: Patient was observed during the procedure. Post-procedure instructions were reviewed.  Patient left the clinic in stable condition.    Clinical History: 07/08/2017 MRI lumbar spine:  INDICATION: Low back pain  TECHNIQUE: Sagittal and axial T1 and T2-weighted sequences were performed. Additional sagittal STIR images were performed.  COMPARISON: None available  FINDINGS:  Last well-formed disc space designated L5-S1 for the purposes of this report.  #  Vertebral bodies: No compression fracture. #  Alignment: Trace retrolisthesis of L5 on S1. #  Marrow signal: Degenerative endplate change at L2-L3 through L5-S1. #  Conus medullaris: Normal. Terminates at T12-L1 with no acute abnormality.  #  Lower thoracic segments: No significant abnormality. #  1.5 cm T2 hyperintense lesion within the inferior right kidney with possible internal septation.  #  No acute paraspinal soft tissue abnormality.  #  L1-2: Disc bulge eccentric to the right. Mild right foraminal stenosis. No significant central canal or left foraminal stenosis. #  L2-3: Diffuse disc bulge and mild facet arthropathy with ligamentum flavum thickening. Moderate central canal stenosis. Mild to moderate bilateral foraminal stenosis. #  L3-4: Diffuse disc osteophyte bulge. Mild facet arthropathy. No significant central canal stenosis. Mild to moderate left and mild right foraminal stenosis. #  L4-5: Diffuse disc bulge. Mild facet arthropathy. Mild central canal stenosis with partial effacement of the lateral recesses. Mild to moderate left and moderate right foraminal stenosis. #  L5-S1: Diffuse disc osteophyte bulge with a small central disc protrusion. Mild facet arthropathy. No significant central canal stenosis. Mild left and moderate right foraminal  stenosis.   IMPRESSION:  1. 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  2. Moderate central canal stenosis and mild to moderate bilateral foraminal stenosis at L2-L3. 3. Mild central canal stenosis with partial effacement of the lateral recesses and mild to moderate left/moderate right foraminal stenosis at L4-5. 4. Moderate right foraminal stenosis at L5-S1. 5. Mild to moderate left foraminal stenosis at L3-L4. 6. A 1.5 cm T2 hyperintense lesion within the inferior right kidney with possible thin internal septation, suggest a follow-up renal ultrasound for evaluation/characterization.     Objective:  VS:  HT:    WT:   BMI:     BP:(!) 175/82  HR:(!) 50bpm  TEMP: ( )  RESP:  Physical Exam Vitals and nursing note reviewed.  Constitutional:      General: He is not in acute distress.    Appearance: Normal appearance. He is not ill-appearing.  HENT:     Head: Normocephalic and atraumatic.     Right Ear: External ear normal.     Left Ear: External ear normal.     Nose: No congestion.  Eyes:     Extraocular Movements: Extraocular movements intact.  Cardiovascular:     Rate and Rhythm: Normal rate.     Pulses: Normal pulses.  Pulmonary:     Effort: Pulmonary effort is normal. No respiratory distress.  Abdominal:     General: There is no distension.     Palpations: Abdomen is soft.  Musculoskeletal:        General: No tenderness or signs of injury.     Cervical back: Neck supple.     Right lower leg: No edema.     Left lower leg: No edema.     Comments: Patient has good distal strength without clonus. Patient somewhat slow to rise from a seated position to full extension.  There is concordant low back pain with facet loading and lumbar spine extension rotation.  There are no definitive trigger points but the patient is somewhat tender across the lower back and PSIS.  There is no pain with hip rotation.   Skin:    Findings: No erythema or  rash.  Neurological:     General: No focal deficit present.     Mental Status: He is alert and oriented to person, place, and time.     Sensory: No sensory deficit.     Motor: No weakness or abnormal muscle tone.     Coordination: Coordination normal.  Psychiatric:        Mood and Affect: Mood normal.        Behavior: Behavior normal.     Imaging: No results found.

## 2021-04-17 NOTE — Procedures (Signed)
Lumbar Diagnostic Facet Joint Nerve Block with Fluoroscopic Guidance   Patient: [name] [name].      Date of Birth: 1940/11/12 MRN: [id number] PCP: Daisy Floro, MD      Visit Date: 04/04/2021   Universal Protocol:    Date/Time: 11/28/228:37 PM  Consent Given By: the patient  Position: PRONE  Additional Comments: Vital signs were monitored before and after the procedure. Patient was prepped and draped in the usual sterile fashion. The correct patient, procedure, and site was verified.   Injection Procedure Details:   Procedure diagnoses:  1. Spondylosis without myelopathy or radiculopathy, lumbar region      Meds Administered:  Meds ordered this encounter  Medications   methylPREDNISolone acetate (DEPO-MEDROL) injection 80 mg     Laterality: Bilateral  Location/Site: L4-L5, L3 and L4 medial branches and L5-S1, L4 medial branch and L5 dorsal ramus  Needle: 5.0 in., 25 ga.  Short bevel or Quincke spinal needle  Needle Placement: Oblique pedical  Findings:   -Comments: There was excellent flow of contrast along the articular pillars without intravascular flow.  Procedure Details: The fluoroscope beam is vertically oriented in AP and then obliqued 15 to 20 degrees to the ipsilateral side of the desired nerve to achieve the "Scotty dog" appearance.  The skin over the target area of the junction of the superior articulating process and the transverse process (sacral ala if blocking the L5 dorsal rami) was locally anesthetized with a 1 ml volume of 1% Lidocaine without Epinephrine.  The spinal needle was inserted and advanced in a trajectory view down to the target.   After contact with periosteum and negative aspirate for blood and CSF, correct placement without intravascular or epidural spread was confirmed by injecting 0.5 ml. of Isovue-250.  A spot radiograph was obtained of this image.    Next, a 0.5 ml. volume of the injectate described above was injected. The  needle was then redirected to the other facet joint nerves mentioned above if needed.  Prior to the procedure, the patient was given a Pain Diary which was completed for baseline measurements.  After the procedure, the patient rated their pain every 30 minutes and will continue rating at this frequency for a total of 5 hours.  The patient has been asked to complete the Diary and return to [place] by mail, fax or hand delivered as soon as possible.   Additional Comments:  No complications occurred Dressing: 2 x 2 sterile gauze and Band-Aid    Post-procedure details: Patient was observed during the procedure. Post-procedure instructions were reviewed.  Patient left the clinic in stable condition.

## 2021-05-03 DIAGNOSIS — R3912 Poor urinary stream: Secondary | ICD-10-CM | POA: Diagnosis not present

## 2021-05-23 ENCOUNTER — Telehealth: Payer: Self-pay | Admitting: Physical Medicine and Rehabilitation

## 2021-05-23 ENCOUNTER — Encounter: Payer: Self-pay | Admitting: Physical Medicine and Rehabilitation

## 2021-05-23 NOTE — Telephone Encounter (Signed)
"  My back has really been hurting lately. I think it may be time to go to the next level of treatment, which I think is nerve ablation. The last shots lasted a little while but not very long. Can we possibly schedule an appointment or maybe just go straight to the treatment?"  Pt message sent through [facility] and is unsure if pt needs an OV first. Last visit was 04/04/21 and the best call back number is [phone].

## 2021-05-23 NOTE — Telephone Encounter (Signed)
Pt scheduled  

## 2021-05-23 NOTE — Telephone Encounter (Signed)
Sent message to [facility] to advise per previous notes on 04/04/21

## 2021-05-23 NOTE — Telephone Encounter (Signed)
Pt returning call concerning his referral to Dr. Ernestina Patches. Please call pt at [phone]

## 2021-05-30 ENCOUNTER — Encounter: Payer: Self-pay | Admitting: Orthopaedic Surgery

## 2021-05-30 ENCOUNTER — Ambulatory Visit: Payer: Medicare Other | Admitting: Orthopaedic Surgery

## 2021-05-30 VITALS — Ht 68.0 in | Wt 200.6 lb

## 2021-05-30 DIAGNOSIS — M1711 Unilateral primary osteoarthritis, right knee: Secondary | ICD-10-CM

## 2021-05-30 DIAGNOSIS — G8929 Other chronic pain: Secondary | ICD-10-CM | POA: Diagnosis not present

## 2021-05-30 DIAGNOSIS — M25561 Pain in right knee: Secondary | ICD-10-CM | POA: Diagnosis not present

## 2021-05-30 MED ORDER — LIDOCAINE HCL 1 % IJ SOLN
3.0000 mL | INTRAMUSCULAR | Status: AC | PRN
  Administered 2021-05-30: 3 mL

## 2021-05-30 MED ORDER — METHYLPREDNISOLONE ACETATE 40 MG/ML IJ SUSP
40.0000 mg | INTRAMUSCULAR | Status: AC | PRN
  Administered 2021-05-30: 40 mg via INTRA_ARTICULAR

## 2021-05-30 NOTE — Progress Notes (Signed)
Office Visit Note   Patient: [name] [name].           Date of Birth: 10/20/1940           MRN: [id number] Visit Date: 05/30/2021              Requested by: Daisy Floro, MD [address] [place],  [place] [address] PCP: Daisy Floro, MD   Assessment & Plan: Visit Diagnoses:  1. Unilateral primary osteoarthritis, right knee   2. Chronic pain of right knee     Plan: He will continue the knee sleeve and quad strengthening exercises for his right knee.  I agree with trying a steroid injection today to help with his acute pain while he is dealing with other family issues prior to proceeding with knee replacement.  When he does get to the point where he wishes to proceed with knee replacement surgery he can give [place] a call so we can get this scheduled.  I did show him a knee replacement model and we talked about what to expect with intraoperative and postoperative course.  I did describe the risk and benefits of surgery as well in terms of knee replacement surgery.  Follow-Up Instructions: Return if symptoms worsen or fail to improve.   Orders:  Orders Placed This Encounter  Procedures   Large Joint Inj   No orders of the defined types were placed in this encounter.     Procedures: Large Joint Inj: R knee on 05/30/2021 9:10 AM Indications: diagnostic evaluation and pain Details: 22 G 1.5 in needle, superolateral approach  Arthrogram: No  Medications: 3 mL lidocaine 1 %; 40 mg methylPREDNISolone acetate 40 MG/ML Outcome: tolerated well, no immediate complications Procedure, treatment alternatives, risks and benefits explained, specific risks discussed. Consent was given by the patient. Immediately prior to procedure a time out was called to verify the correct patient, procedure, equipment, support staff and site/side marked as required. Patient was prepped and draped in the usual sterile fashion.      Clinical Data: No additional findings.   Subjective: Chief  Complaint  Patient presents with   Right Knee - Pain  The patient is well-known to me.  He is an active 81 year old gentleman who has well-documented osteoarthritis of his right knee.  He has tried and failed conservative treatment including steroid injections and hyaluronic acid injections.  It is mainly medial joint line tenderness and it is impacting his daily life detrimentally in terms of pain with weightbearing and activities in general periods is interested in knee replacement surgery at some point but right now is helping out his wife was taking care of a very sick brother.  Right now he is wearing a knee sleeve and we talked about the possibility of a steroid injection temporize his pain well he has to help his wife about.  At some point he is interested in knee replacement surgery but right now that is not the case.  He has had no acute change in medical status and is not diabetic.  HPI  Review of Systems There is currently no fever, chills, nausea, vomiting  Objective: Vital Signs: Ht 5\' 8"  (1.727 m)    Wt 200 lb 9.6 oz (91 kg)    BMI 30.50 kg/m   Physical Exam He is alert and orient x3 and in no acute distress Ortho Exam Examination his right knee shows medial joint tenderness with varus malalignment and patellofemoral crepitation.  There is good range of  motion of his knee and is ligamentously stable.  The varus malalignment is correctable. Specialty Comments:  No specialty comments available.  Imaging: No results found. Previous x-rays 7 months ago of the right knee shows tricompartment arthritis with varus malalignment and medial joint space narrowing as well as patellofemoral narrowing.  PMFS History: Patient Active Problem List   Diagnosis Date Noted   Unilateral primary osteoarthritis, right knee 10/19/2020   Facet joint disease of lumbosacral region 08/12/2017   Chronic bilateral low back pain 06/24/2017   Status post arthroscopy of right knee 06/06/2017   Acute  pain of right knee 09/10/2016   Past Medical History:  Diagnosis Date   BPH (benign prostatic hyperplasia)    Chronic kidney disease    kidney stones   Headache(784.0)    migraines   Hyperlipidemia    Hypertension     Family History  Problem Relation Age of Onset   Diabetes Father    Hypertension Father    Heart disease Father    Uterine cancer Mother    Hypertension Mother     Past Surgical History:  Procedure Laterality Date   ACHILLES TENDON SURGERY     APPENDECTOMY     BACK SURGERY     COLONOSCOPY     HERNIA REPAIR     INGUINAL HERNIA REPAIR Right 05/26/2013   Procedure: RIGHT INGUINAL HERNIA REPAIR ;  Surgeon: Maisie Fus A. Cornett, MD;  Location: [facility] OR;  Service: General;  Laterality: Right;   INSERTION OF MESH Right 05/26/2013   Procedure: INSERTION OF MESH;  Surgeon: Clovis Pu. Cornett, MD;  Location: [facility] OR;  Service: General;  Laterality: Right;   NASAL SEPTUM SURGERY     right hand surgery     TONSILLECTOMY     Social History   Occupational History   Not on file  Tobacco Use   Smoking status: Former    Packs/day: 1.00    Years: 5.00    Pack years: 5.00    Types: Cigarettes    Quit date: 05/21/1964    Years since quitting: 57.0   Smokeless tobacco: Never  Substance and Sexual Activity   Alcohol use: No   Drug use: No   Sexual activity: Not on file

## 2021-06-13 ENCOUNTER — Ambulatory Visit: Payer: Medicare Other | Admitting: Physical Medicine and Rehabilitation

## 2021-06-13 ENCOUNTER — Other Ambulatory Visit: Payer: Self-pay

## 2021-06-13 ENCOUNTER — Ambulatory Visit: Payer: Self-pay

## 2021-06-13 ENCOUNTER — Encounter: Payer: Self-pay | Admitting: Physical Medicine and Rehabilitation

## 2021-06-13 VITALS — BP 180/77 | HR 52

## 2021-06-13 DIAGNOSIS — M47816 Spondylosis without myelopathy or radiculopathy, lumbar region: Secondary | ICD-10-CM | POA: Diagnosis not present

## 2021-06-13 MED ORDER — METHYLPREDNISOLONE ACETATE 80 MG/ML IJ SUSP
80.0000 mg | Freq: Once | INTRAMUSCULAR | Status: AC
  Administered 2021-06-13: 16:00:00 80 mg

## 2021-06-13 NOTE — Progress Notes (Signed)
Pt state lower back pain. Pt state any movement makes the pain worse. Pt state he takes over the counter pain meds to help ease his pain.  Numeric Pain Rating Scale and Functional Assessment Average Pain 5   In the last MONTH (on 0-10 scale) has pain interfered with the following?  1. General activity like being  able to carry out your everyday physical activities such as walking, climbing stairs, carrying groceries, or moving a chair?  Rating(9)   +Driver, -BT, -Dye Allergies.

## 2021-06-13 NOTE — Patient Instructions (Signed)

## 2021-06-14 NOTE — Progress Notes (Signed)
[name] [name]. - 81 y.o. male MRN [id number]  Date of birth: May 07, 1941  Office Visit Note: Visit Date: 06/13/2021 PCP: Daisy Floro, MD Referred by: Daisy Floro, MD  Subjective: Chief Complaint  Patient presents with   Lower Back - Pain   HPI:  [name] [name]. is a 81 y.o. male who comes in todayfor planned radiofrequency ablation of the Right L4-5 and L5-S1 Lumbar facet joints. This would be ablation of the corresponding medial branches and/or dorsal rami.  Patient has had double diagnostic blocks with more than 50% relief.  These are documented on pain diary.  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  They have no radicular pain.  Their axial pain is worse with standing and ambulating and on exam today with facet loading.  They have had physical therapy as well as home exercise program.  The imaging noted in the chart below indicated facet pathology. Accordingly they meet all the criteria and qualification for for radiofrequency ablation and we are going to complete this today hopefully for more longer term relief as part of comprehensive management program.   ROS Otherwise per HPI.  Assessment & Plan: Visit Diagnoses:    ICD-10-CM   1. Spondylosis without myelopathy or radiculopathy, lumbar region  M47.816 XR C-ARM NO REPORT    Radiofrequency,Lumbar    methylPREDNISolone acetate (DEPO-MEDROL) injection 80 mg      Plan: No additional findings.   Meds & Orders:  Meds ordered this encounter  Medications   methylPREDNISolone acetate (DEPO-MEDROL) injection 80 mg    Orders Placed This Encounter  Procedures   Radiofrequency,Lumbar   XR C-ARM NO REPORT    Follow-up: Return if symptoms worsen or fail to improve.   Procedures: No procedures performed  Lumbar Facet Joint Nerve Denervation  Patient: [name] [name].      Date of Birth: 1940/12/28 MRN:  [id number] PCP: Daisy Floro, MD      Visit Date: 06/13/2021   Universal Protocol:    Date/Time: 06/14/2308:06 AM  Consent Given By: the patient  Position: PRONE  Additional Comments: Vital signs were monitored before and after the procedure. Patient was prepped and draped in the usual sterile fashion. The correct patient, procedure, and site was verified.   Injection Procedure Details:   Procedure diagnoses:  1. Spondylosis without myelopathy or radiculopathy, lumbar region      Meds Administered:  Meds ordered this encounter  Medications   methylPREDNISolone acetate (DEPO-MEDROL) injection 80 mg     Laterality: Right  Location/Site:  L4-L5, L3 and L4 medial branches and L5-S1, L4 medial branch and L5 dorsal ramus  Needle: 18 ga.,  85mm active tip, RF Cannula  Needle Placement: Along juncture of superior articular process and transverse pocess  Findings:  -Comments:  Procedure Details: For each desired target nerve, the corresponding transverse process (sacral ala for the L5 dorsal rami) was identified and the fluoroscope was positioned to square off the endplates of the corresponding vertebral body to achieve a true AP midline view.  The beam was then obliqued 15 to 20 degrees and caudally tilted 15 to 20 degrees to line up a trajectory along the target nerves. The skin over the target of the junction of superior articulating process and transverse process (sacral ala for the L5 dorsal rami) was infiltrated with 32ml of 1% Lidocaine without Epinephrine.  The 18 gauge  40mm active tip outer cannula was advanced in trajectory view to the target.  This procedure was repeated for each target nerve.  Then, for all levels, the outer cannula placement was fine-tuned and the position was then confirmed with bi-planar imaging.    Test stimulation was done both at sensory and motor levels to ensure there was no radicular stimulation. The target tissues were then  infiltrated with 1 ml of 1% Lidocaine without Epinephrine. Subsequently, a percutaneous neurotomy was carried out for 90 seconds at 80 degrees Celsius.  After the completion of the lesion, 1 ml of injectate was delivered. It was then repeated for each facet joint nerve mentioned above. Appropriate radiographs were obtained to verify the probe placement during the neurotomy.   Additional Comments:  The patient tolerated the procedure well Dressing: 2 x 2 sterile gauze and Band-Aid    Post-procedure details: Patient was observed during the procedure. Post-procedure instructions were reviewed.  Patient left the clinic in stable condition.       Clinical History: 07/08/2017 MRI lumbar spine:  INDICATION: Low back pain  TECHNIQUE: Sagittal and axial T1 and T2-weighted sequences were performed. Additional sagittal STIR images were performed.  COMPARISON: None available  FINDINGS:  Last well-formed disc space designated L5-S1 for the purposes of this report.  #  Vertebral bodies: No compression fracture. #  Alignment: Trace retrolisthesis of L5 on S1. #  Marrow signal: Degenerative endplate change at L2-L3 through L5-S1. #  Conus medullaris: Normal. Terminates at T12-L1 with no acute abnormality.  #  Lower thoracic segments: No significant abnormality. #  1.5 cm T2 hyperintense lesion within the inferior right kidney with possible internal septation.  #  No acute paraspinal soft tissue abnormality.  #  L1-2: Disc bulge eccentric to the right. Mild right foraminal stenosis. No significant central canal or left foraminal stenosis. #  L2-3: Diffuse disc bulge and mild facet arthropathy with ligamentum flavum thickening. Moderate central canal stenosis. Mild to moderate bilateral foraminal stenosis. #  L3-4: Diffuse disc osteophyte bulge. Mild facet arthropathy. No significant central canal stenosis. Mild to moderate left and mild right foraminal stenosis. #  L4-5: Diffuse disc bulge.  Mild facet arthropathy. Mild central canal stenosis with partial effacement of the lateral recesses. Mild to moderate left and moderate right foraminal stenosis. #  L5-S1: Diffuse disc osteophyte bulge with a small central disc protrusion. Mild facet arthropathy. No significant central canal stenosis. Mild left and moderate right foraminal stenosis.   IMPRESSION:  1. 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  2. Moderate central canal stenosis and mild to moderate bilateral foraminal stenosis at L2-L3. 3. Mild central canal stenosis with partial effacement of the lateral recesses and mild to moderate left/moderate right foraminal stenosis at L4-5. 4. Moderate right foraminal stenosis at L5-S1. 5. Mild to moderate left foraminal stenosis at L3-L4. 6. A 1.5 cm T2 hyperintense lesion within the inferior right kidney with possible thin internal septation, suggest a follow-up renal ultrasound for evaluation/characterization.     Objective:  VS:  HT:     WT:    BMI:      BP:(!) 180/77   HR:(!) 52bpm   TEMP: ( )   RESP:  Physical Exam Vitals and nursing note reviewed.  Constitutional:      General: He is not in acute distress.    Appearance: Normal appearance. He is not ill-appearing.  HENT:     Head: Normocephalic and atraumatic.     Right  Ear: External ear normal.     Left Ear: External ear normal.     Nose: No congestion.  Eyes:     Extraocular Movements: Extraocular movements intact.  Cardiovascular:     Rate and Rhythm: Normal rate.     Pulses: Normal pulses.  Pulmonary:     Effort: Pulmonary effort is normal. No respiratory distress.  Abdominal:     General: There is no distension.     Palpations: Abdomen is soft.  Musculoskeletal:        General: No tenderness or signs of injury.     Cervical back: Neck supple.     Right lower leg: No edema.     Left lower leg: No edema.     Comments: Patient has good distal strength without clonus.  Patient somewhat slow to rise from a seated position to full extension.  There is concordant low back pain with facet loading and lumbar spine extension rotation.  There are no definitive trigger points but the patient is somewhat tender across the lower back and PSIS.  There is no pain with hip rotation.   Skin:    Findings: No erythema or rash.  Neurological:     General: No focal deficit present.     Mental Status: He is alert and oriented to person, place, and time.     Sensory: No sensory deficit.     Motor: No weakness or abnormal muscle tone.     Coordination: Coordination normal.  Psychiatric:        Mood and Affect: Mood normal.        Behavior: Behavior normal.     Imaging: XR C-ARM NO REPORT  Result Date: 06/13/2021 Please see Notes tab for imaging impression.

## 2021-06-14 NOTE — Procedures (Signed)
Lumbar Facet Joint Nerve Denervation  Patient: [name] [name].      Date of Birth: [birth year] MRN: [id number] PCP: Lawerance Cruel, MD      Visit Date: 06/13/2021   Universal Protocol:    Date/Time: 06/14/2308:06 AM  Consent Given By: the patient  Position: PRONE  Additional Comments: Vital signs were monitored before and after the procedure. Patient was prepped and draped in the usual sterile fashion. The correct patient, procedure, and site was verified.   Injection Procedure Details:   Procedure diagnoses:  1. Spondylosis without myelopathy or radiculopathy, lumbar region      Meds Administered:  Meds ordered this encounter  Medications   methylPREDNISolone acetate (DEPO-MEDROL) injection 80 mg     Laterality: Right  Location/Site:  L4-L5, L3 and L4 medial branches and L5-S1, L4 medial branch and L5 dorsal ramus  Needle: 18 ga.,  44mm active tip, 162mm RF Cannula  Needle Placement: Along juncture of superior articular process and transverse pocess  Findings:  -Comments:  Procedure Details: For each desired target nerve, the corresponding transverse process (sacral ala for the L5 dorsal rami) was identified and the fluoroscope was positioned to square off the endplates of the corresponding vertebral body to achieve a true AP midline view.  The beam was then obliqued 15 to 20 degrees and caudally tilted 15 to 20 degrees to line up a trajectory along the target nerves. The skin over the target of the junction of superior articulating process and transverse process (sacral ala for the L5 dorsal rami) was infiltrated with 30ml of 1% Lidocaine without Epinephrine.  The 18 gauge 39mm active tip outer cannula was advanced in trajectory view to the target.  This procedure was repeated for each target nerve.  Then, for all levels, the outer cannula placement was fine-tuned and the position was then confirmed with bi-planar imaging.    Test stimulation was done both at  sensory and motor levels to ensure there was no radicular stimulation. The target tissues were then infiltrated with 1 ml of 1% Lidocaine without Epinephrine. Subsequently, a percutaneous neurotomy was carried out for 90 seconds at 80 degrees Celsius.  After the completion of the lesion, 1 ml of injectate was delivered. It was then repeated for each facet joint nerve mentioned above. Appropriate radiographs were obtained to verify the probe placement during the neurotomy.   Additional Comments:  The patient tolerated the procedure well Dressing: 2 x 2 sterile gauze and Band-Aid    Post-procedure details: Patient was observed during the procedure. Post-procedure instructions were reviewed.  Patient left the clinic in stable condition.

## 2021-06-19 ENCOUNTER — Ambulatory Visit: Payer: Medicare Other | Admitting: Orthopaedic Surgery

## 2021-06-19 DIAGNOSIS — G8929 Other chronic pain: Secondary | ICD-10-CM

## 2021-06-19 DIAGNOSIS — M25561 Pain in right knee: Secondary | ICD-10-CM

## 2021-06-19 DIAGNOSIS — M1711 Unilateral primary osteoarthritis, right knee: Secondary | ICD-10-CM | POA: Diagnosis not present

## 2021-06-19 NOTE — Progress Notes (Signed)
The patient comes in today to discuss knee replacement surgery for his right knee.  He is 81 years old and very active.  I have seen him for some period of time now for his right knee.  He has had multiple steroid injections in that right knee and hyaluronic acid.  He does wear his knee sleeve as well.  At this point his right knee pain can be daily.  At times he feels like his unstable and it is hurting him enough to proceed with knee replacement surgery given the failure of conservative treatment for well over a year.  This also includes activity modification and quad strengthening exercises as well as anti-inflammatories.  He has had no acute changes in medical status.  He currently denies any headache, chest pain, shortness of breath, fever, chills, nausea, vomiting.  Also reviewed his medications with him as well.  He is very active 80.  He is not a diabetic.  Examination of his right knee shows that he lacks full extension by few degrees.  His flexion is pretty good but is definitely worse than his left knee that is asymptomatic.  There is patellofemoral crepitation and medial lateral joint line tenderness.  Previous x-rays show tricompartment arthritis with varus malalignment and joint space narrowing medial and patellofemoral.  There is also osteophytes in the knee.  At this point I did show him a knee replacement model and explained in detail what knee replacement surgery involves.  We discussed the risks and benefits of the surgery and what to expect with an intraoperative and postoperative course.  All questions and concerns were answered and addressed.  We will work on getting this scheduled.

## 2021-06-20 ENCOUNTER — Other Ambulatory Visit: Payer: Self-pay

## 2021-06-20 ENCOUNTER — Ambulatory Visit: Payer: Medicare Other | Admitting: Physical Medicine and Rehabilitation

## 2021-06-20 ENCOUNTER — Ambulatory Visit: Payer: Self-pay

## 2021-06-20 ENCOUNTER — Encounter: Payer: Self-pay | Admitting: Physical Medicine and Rehabilitation

## 2021-06-20 VITALS — BP 116/72 | HR 67

## 2021-06-20 DIAGNOSIS — M47816 Spondylosis without myelopathy or radiculopathy, lumbar region: Secondary | ICD-10-CM | POA: Diagnosis not present

## 2021-06-20 MED ORDER — METHYLPREDNISOLONE ACETATE 80 MG/ML IJ SUSP
80.0000 mg | Freq: Once | INTRAMUSCULAR | Status: AC
  Administered 2021-06-20: 80 mg

## 2021-06-20 NOTE — Patient Instructions (Signed)

## 2021-06-20 NOTE — Progress Notes (Signed)
Pt state lower back pain. Pt state any movement makes the pain worse. Pt state he takes over the counter pain meds to help ease his pain.  Numeric Pain Rating Scale and Functional Assessment Average Pain 3   In the last MONTH (on 0-10 scale) has pain interfered with the following?  1. General activity like being  able to carry out your everyday physical activities such as walking, climbing stairs, carrying groceries, or moving a chair?  Rating(9)   +Driver, -BT, -Dye Allergies.

## 2021-07-01 NOTE — Procedures (Signed)
Lumbar Facet Joint Nerve Denervation  Patient: [name] [name].      Date of Birth: 1940-11-13 MRN: [id number] PCP: Lawerance Cruel, MD      Visit Date: 06/20/2021   Universal Protocol:    Date/Time: 02/11/239:52 AM  Consent Given By: the patient  Position: PRONE  Additional Comments: Vital signs were monitored before and after the procedure. Patient was prepped and draped in the usual sterile fashion. The correct patient, procedure, and site was verified.   Injection Procedure Details:   Procedure diagnoses:  1. Spondylosis without myelopathy or radiculopathy, lumbar region      Meds Administered:  Meds ordered this encounter  Medications   methylPREDNISolone acetate (DEPO-MEDROL) injection 80 mg     Laterality: Left  Location/Site:  L4-L5, L3 and L4 medial branches and L5-S1, L4 medial branch and L5 dorsal ramus  Needle: 18 ga.,  24mm active tip, 136mm RF Cannula  Needle Placement: Along juncture of superior articular process and transverse pocess  Findings:  -Comments:  Procedure Details: For each desired target nerve, the corresponding transverse process (sacral ala for the L5 dorsal rami) was identified and the fluoroscope was positioned to square off the endplates of the corresponding vertebral body to achieve a true AP midline view.  The beam was then obliqued 15 to 20 degrees and caudally tilted 15 to 20 degrees to line up a trajectory along the target nerves. The skin over the target of the junction of superior articulating process and transverse process (sacral ala for the L5 dorsal rami) was infiltrated with 108ml of 1% Lidocaine without Epinephrine.  The 18 gauge 12mm active tip outer cannula was advanced in trajectory view to the target.  This procedure was repeated for each target nerve.  Then, for all levels, the outer cannula placement was fine-tuned and the position was then confirmed with bi-planar imaging.    Test stimulation was done both at sensory  and motor levels to ensure there was no radicular stimulation. The target tissues were then infiltrated with 1 ml of 1% Lidocaine without Epinephrine. Subsequently, a percutaneous neurotomy was carried out for 90 seconds at 80 degrees Celsius.  After the completion of the lesion, 1 ml of injectate was delivered. It was then repeated for each facet joint nerve mentioned above. Appropriate radiographs were obtained to verify the probe placement during the neurotomy.   Additional Comments:  The patient tolerated the procedure well Dressing: 2 x 2 sterile gauze and Band-Aid    Post-procedure details: Patient was observed during the procedure. Post-procedure instructions were reviewed.  Patient left the clinic in stable condition.

## 2021-07-01 NOTE — Progress Notes (Signed)
[name] [name]. - 81 y.o. male MRN [id number]  Date of birth: 05/06/1941  Office Visit Note: Visit Date: 06/20/2021 PCP: Daisy Floro, MD Referred by: Daisy Floro, MD  Subjective: Chief Complaint  Patient presents with   Lower Back - Pain   HPI:  [name] [name] [name]. is a 81 y.o. male who comes in todayfor planned radiofrequency ablation of the Left L4-5 and L5-S1 Lumbar facet joints. This would be ablation of the corresponding medial branches and/or dorsal rami.  Patient has had double diagnostic blocks with more than 50% relief.  These are documented on pain diary.  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  They have no radicular pain.  Their axial pain is worse with standing and ambulating and on exam today with facet loading.  They have had physical therapy as well as home exercise program.  The imaging noted in the chart below indicated facet pathology. Accordingly they meet all the criteria and qualification for for radiofrequency ablation and we are going to complete this today hopefully for more longer term relief as part of comprehensive management program.   ROS Otherwise per HPI.  Assessment & Plan: Visit Diagnoses:    ICD-10-CM   1. Spondylosis without myelopathy or radiculopathy, lumbar region  M47.816 XR C-ARM NO REPORT    methylPREDNISolone acetate (DEPO-MEDROL) injection 80 mg    Radiofrequency,Lumbar      Plan: No additional findings.   Meds & Orders:  Meds ordered this encounter  Medications   methylPREDNISolone acetate (DEPO-MEDROL) injection 80 mg    Orders Placed This Encounter  Procedures   Radiofrequency,Lumbar   XR C-ARM NO REPORT    Follow-up: Return if symptoms worsen or fail to improve.   Procedures: No procedures performed  Lumbar Facet Joint Nerve Denervation  Patient: [name] Delray Alt.      Date of Birth: 09-20-1940 MRN:  [id number] PCP: Daisy Floro, MD      Visit Date: 06/20/2021   Universal Protocol:    Date/Time: 02/11/239:52 AM  Consent Given By: the patient  Position: PRONE  Additional Comments: Vital signs were monitored before and after the procedure. Patient was prepped and draped in the usual sterile fashion. The correct patient, procedure, and site was verified.   Injection Procedure Details:   Procedure diagnoses:  1. Spondylosis without myelopathy or radiculopathy, lumbar region      Meds Administered:  Meds ordered this encounter  Medications   methylPREDNISolone acetate (DEPO-MEDROL) injection 80 mg     Laterality: Left  Location/Site:  L4-L5, L3 and L4 medial branches and L5-S1, L4 medial branch and L5 dorsal ramus  Needle: 18 ga.,  65mm active tip, RF Cannula  Needle Placement: Along juncture of superior articular process and transverse pocess  Findings:  -Comments:  Procedure Details: For each desired target nerve, the corresponding transverse process (sacral ala for the L5 dorsal rami) was identified and the fluoroscope was positioned to square off the endplates of the corresponding vertebral body to achieve a true AP midline view.  The beam was then obliqued 15 to 20 degrees and caudally tilted 15 to 20 degrees to line up a trajectory along the target nerves. The skin over the target of the junction of superior articulating process and transverse process (sacral ala for the L5 dorsal rami) was infiltrated with 33ml of 1% Lidocaine without Epinephrine.  The 18 gauge  57mm active tip outer cannula was advanced in trajectory view to the target.  This procedure was repeated for each target nerve.  Then, for all levels, the outer cannula placement was fine-tuned and the position was then confirmed with bi-planar imaging.    Test stimulation was done both at sensory and motor levels to ensure there was no radicular stimulation. The target tissues were then  infiltrated with 1 ml of 1% Lidocaine without Epinephrine. Subsequently, a percutaneous neurotomy was carried out for 90 seconds at 80 degrees Celsius.  After the completion of the lesion, 1 ml of injectate was delivered. It was then repeated for each facet joint nerve mentioned above. Appropriate radiographs were obtained to verify the probe placement during the neurotomy.   Additional Comments:  The patient tolerated the procedure well Dressing: 2 x 2 sterile gauze and Band-Aid    Post-procedure details: Patient was observed during the procedure. Post-procedure instructions were reviewed.  Patient left the clinic in stable condition.      Clinical History: 07/08/2017 MRI lumbar spine:  INDICATION: Low back pain  TECHNIQUE: Sagittal and axial T1 and T2-weighted sequences were performed. Additional sagittal STIR images were performed.  COMPARISON: None available  FINDINGS:  Last well-formed disc space designated L5-S1 for the purposes of this report.  #  Vertebral bodies: No compression fracture. #  Alignment: Trace retrolisthesis of L5 on S1. #  Marrow signal: Degenerative endplate change at L2-L3 through L5-S1. #  Conus medullaris: Normal. Terminates at T12-L1 with no acute abnormality.  #  Lower thoracic segments: No significant abnormality. #  1.5 cm T2 hyperintense lesion within the inferior right kidney with possible internal septation.  #  No acute paraspinal soft tissue abnormality.  #  L1-2: Disc bulge eccentric to the right. Mild right foraminal stenosis. No significant central canal or left foraminal stenosis. #  L2-3: Diffuse disc bulge and mild facet arthropathy with ligamentum flavum thickening. Moderate central canal stenosis. Mild to moderate bilateral foraminal stenosis. #  L3-4: Diffuse disc osteophyte bulge. Mild facet arthropathy. No significant central canal stenosis. Mild to moderate left and mild right foraminal stenosis. #  L4-5: Diffuse disc bulge.  Mild facet arthropathy. Mild central canal stenosis with partial effacement of the lateral recesses. Mild to moderate left and moderate right foraminal stenosis. #  L5-S1: Diffuse disc osteophyte bulge with a small central disc protrusion. Mild facet arthropathy. No significant central canal stenosis. Mild left and moderate right foraminal stenosis.   IMPRESSION:  1. 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  2. Moderate central canal stenosis and mild to moderate bilateral foraminal stenosis at L2-L3. 3. Mild central canal stenosis with partial effacement of the lateral recesses and mild to moderate left/moderate right foraminal stenosis at L4-5. 4. Moderate right foraminal stenosis at L5-S1. 5. Mild to moderate left foraminal stenosis at L3-L4. 6. A 1.5 cm T2 hyperintense lesion within the inferior right kidney with possible thin internal septation, suggest a follow-up renal ultrasound for evaluation/characterization.     Objective:  VS:  HT:     WT:    BMI:      BP:116/72   HR:67bpm   TEMP: ( )   RESP:  Physical Exam Vitals and nursing note reviewed.  Constitutional:      General: He is not in acute distress.    Appearance: Normal appearance. He is not ill-appearing.  HENT:     Head: Normocephalic and atraumatic.     Right Ear: External ear  normal.     Left Ear: External ear normal.     Nose: No congestion.  Eyes:     Extraocular Movements: Extraocular movements intact.  Cardiovascular:     Rate and Rhythm: Normal rate.     Pulses: Normal pulses.  Pulmonary:     Effort: Pulmonary effort is normal. No respiratory distress.  Abdominal:     General: There is no distension.     Palpations: Abdomen is soft.  Musculoskeletal:        General: No tenderness or signs of injury.     Cervical back: Neck supple.     Right lower leg: No edema.     Left lower leg: No edema.     Comments: Patient has good distal strength without clonus. Patient  somewhat slow to rise from a seated position to full extension.  There is concordant low back pain with facet loading and lumbar spine extension rotation.  There are no definitive trigger points but the patient is somewhat tender across the lower back and PSIS.  There is no pain with hip rotation.   Skin:    Findings: No erythema or rash.  Neurological:     General: No focal deficit present.     Mental Status: He is alert and oriented to person, place, and time.     Sensory: No sensory deficit.     Motor: No weakness or abnormal muscle tone.     Coordination: Coordination normal.  Psychiatric:        Mood and Affect: Mood normal.        Behavior: Behavior normal.     Imaging: No results found.

## 2021-07-27 ENCOUNTER — Other Ambulatory Visit: Payer: Self-pay

## 2021-08-11 NOTE — Pre-Procedure Instructions (Signed)
Surgical Instructions ? ? ? Your procedure is scheduled on Tuesday, April 4th. ? Report to [facility] Main Entrance "A" at 07:30 A.M., then check in with the Admitting office. ? Call this number if you have problems the morning of surgery: ? [phone] ? ? If you have any questions prior to your surgery date call [phone]: Open Monday-Friday 8am-4pm ? ? ? Remember: ? Do not eat after midnight the night before your surgery ? ?You may drink clear liquids until 06:30 AM the morning of your surgery.   ?Clear liquids allowed are: Water, Non-Citrus Juices (without pulp), Carbonated Beverages, Clear Tea, Black Coffee ONLY (NO MILK, CREAM OR POWDERED CREAMER of any kind), and Gatorade ?  ? Take these medicines the morning of surgery with A SIP OF WATER:  ?finasteride (PROSCAR)  ?tamsulosin [facility])  ?acetaminophen (TYLENOL)- if needed ? ? ?As of today, STOP taking any Aspirin (unless otherwise instructed by your surgeon) Aleve, Naproxen, Ibuprofen, Motrin, Advil, Goody's, BC's, all herbal medications, fish oil, and all vitamins. ? ?         ?Do not wear jewelry  ?Do not wear lotions, powders, colognes, or deodorant. ?Do not shave 48 hours prior to surgery.  Men may shave face and neck. ?Do not bring valuables to the hospital. ? ? ?[facility] is not responsible for any belongings or valuables. .  ? ?Do NOT Smoke (Tobacco/Vaping)  24 hours prior to your procedure ? ?If you use a CPAP at night, you may bring your mask for your overnight stay. ?  ?Contacts, glasses, hearing aids, dentures or partials may not be worn into surgery, please bring cases for these belongings ?  ?For patients admitted to the hospital, discharge time will be determined by your treatment team. ?  ?Patients discharged the day of surgery will not be allowed to drive home, and someone needs to stay with them for 24 hours. ? ? ?SURGICAL WAITING ROOM VISITATION ?Patients having surgery or a procedure in a hospital may have two support  people. ?Children under the age of 43 must have an adult with them who is not the patient. ?They may stay in the waiting area during the procedure and may switch out with other visitors. If the patient needs to stay at the hospital during part of their recovery, the visitor guidelines for inpatient rooms apply. ? ?Please refer to the [facility] website for the visitor guidelines for Inpatients (after your surgery is over and you are in a regular room).  ? ? ? ? ? ?Special instructions:   ? ?Oral Hygiene is also important to reduce your risk of infection.  Remember - BRUSH YOUR TEETH THE MORNING OF SURGERY WITH YOUR REGULAR TOOTHPASTE ? ? ?[facility]- Preparing For Surgery ? ?Before surgery, you can play an important role. Because skin is not sterile, your skin needs to be as free of germs as possible. You can reduce the number of germs on your skin by washing with CHG (chlorahexidine gluconate) Soap before surgery.  CHG is an antiseptic cleaner which kills germs and bonds with the skin to continue killing germs even after washing.   ? ? ?Please do not use if you have an allergy to CHG or antibacterial soaps. If your skin becomes reddened/irritated stop using the CHG.  ?Do not shave (including legs and underarms) for at least 48 hours prior to first CHG shower. It is OK to shave your face. ? ?Please follow these instructions carefully. ?  ? ? Shower the  NIGHT BEFORE SURGERY and the MORNING OF SURGERY with CHG Soap.  ? If you chose to wash your hair, wash your hair first as usual with your normal shampoo. After you shampoo, rinse your hair and body thoroughly to remove the shampoo.  Then [employer] and genitals (private parts) with your normal soap and rinse thoroughly to remove soap. ? ?After that Use CHG Soap as you would any other liquid soap. You can apply CHG directly to the skin and wash gently with a scrungie or a clean washcloth.  ? ?Apply the CHG Soap to your body ONLY FROM THE NECK DOWN.  Do not use on open  wounds or open sores. Avoid contact with your eyes, ears, mouth and genitals (private parts). Wash Face and genitals (private parts)  with your normal soap.  ? ?Wash thoroughly, paying special attention to the area where your surgery will be performed. ? ?Thoroughly rinse your body with warm water from the neck down. ? ?DO NOT shower/wash with your normal soap after using and rinsing off the CHG Soap. ? ?Pat yourself dry with a CLEAN TOWEL. ? ?Wear CLEAN PAJAMAS to bed the night before surgery ? ?Place CLEAN SHEETS on your bed the night before your surgery ? ?DO NOT SLEEP WITH PETS. ? ? ?Day of Surgery: ? ?Take a shower with CHG soap. ?Wear Clean/Comfortable clothing the morning of surgery ?Do not apply any deodorants/lotions.   ?Remember to brush your teeth WITH YOUR REGULAR TOOTHPASTE. ? ? ? ?If you received a COVID test during your pre-op visit  it is requested that you wear a mask when out in public, stay away from anyone that may not be feeling well and notify your surgeon if you develop symptoms. If you have been in contact with anyone that has tested positive in the last 10 days please notify you surgeon. ? ?  ?Please read over the following fact sheets that you were given.   ?

## 2021-08-14 ENCOUNTER — Other Ambulatory Visit: Payer: Self-pay

## 2021-08-14 ENCOUNTER — Encounter (HOSPITAL_COMMUNITY): Payer: Self-pay

## 2021-08-14 ENCOUNTER — Encounter (HOSPITAL_COMMUNITY)
Admission: RE | Admit: 2021-08-14 | Discharge: 2021-08-14 | Disposition: A | Discharge status: Home / Self Care | Payer: Medicare Other | Source: Ambulatory Visit | Attending: Orthopaedic Surgery | Admitting: Orthopaedic Surgery

## 2021-08-14 VITALS — BP 158/75 | HR 68 | Temp 97.8°F | Resp 17 | Ht 68.0 in | Wt 209.4 lb

## 2021-08-14 DIAGNOSIS — Z01818 Encounter for other preprocedural examination: Secondary | ICD-10-CM | POA: Insufficient documentation

## 2021-08-14 HISTORY — DX: Personal history of urinary calculi: Z87.442

## 2021-08-14 LAB — BASIC METABOLIC PANEL
Anion gap: 7 (ref 5–15)
BUN: 32 mg/dL — ABNORMAL HIGH (ref 8–23)
CO2: 26 mmol/L (ref 22–32)
Calcium: 8.6 mg/dL — ABNORMAL LOW (ref 8.9–10.3)
Chloride: 105 mmol/L (ref 98–111)
Creatinine, Ser: 1.31 mg/dL — ABNORMAL HIGH (ref 0.61–1.24)
GFR, Estimated: 56 mL/min — ABNORMAL LOW (ref >60)
Glucose, Bld: 114 mg/dL — ABNORMAL HIGH (ref 70–99)
Potassium: 4.1 mmol/L (ref 3.5–5.1)
Sodium: 138 mmol/L (ref 135–145)

## 2021-08-14 LAB — CBC
HCT: 41.2 % (ref 39.0–52.0)
Hemoglobin: 13.2 g/dL (ref 13.0–17.0)
MCH: 29.4 pg (ref 26.0–34.0)
MCHC: 32 g/dL (ref 30.0–36.0)
MCV: 91.8 fL (ref 80.0–100.0)
Platelets: 180 10*3/uL (ref 150–400)
RBC: 4.49 MIL/uL (ref 4.22–5.81)
RDW: 13.9 % (ref 11.5–15.5)
WBC: 7.7 10*3/uL (ref 4.0–10.5)
nRBC: 0 % (ref 0.0–0.2)

## 2021-08-14 LAB — SURGICAL PCR SCREEN
MRSA, PCR: NEGATIVE
Staphylococcus aureus: NEGATIVE

## 2021-08-14 NOTE — Progress Notes (Addendum)
PCP - Daisy Floro, MD ?Cardiologist - denies ? ?PPM/ICD - denies ?Device Orders - n/a ?Rep Notified - n/a ? ?Chest x-ray - n/a ?EKG - 08/14/2021 ?Stress Test - denies ?ECHO - denies ?Cardiac Cath - denies ? ?Sleep Study - denies ?CPAP - n/a ? ?Fasting Blood Sugar - n/a ? ?Blood Thinner Instructions: n/a ? ?Aspirin Instructions: Patient was instructed: As of today, STOP taking any Aspirin (unless otherwise instructed by your surgeon) Aleve, Naproxen, Ibuprofen, Motrin, Advil, Goody's, BC's, all herbal medications, fish oil, and all vitamins. ? ?ERAS Protcol - yes, until 06:30 ? ?COVID TEST- n/a ? ? ?Anesthesia review: no ? ?Patient denies shortness of breath, fever, cough and chest pain at PAT appointment ? ? ?All instructions explained to the patient, with a verbal understanding of the material. Patient agrees to go over the instructions while at home for a better understanding. Patient also instructed to self quarantine after being tested for COVID-19. The opportunity to ask questions was provided. ?  ?

## 2021-08-17 DIAGNOSIS — H905 Unspecified sensorineural hearing loss: Secondary | ICD-10-CM | POA: Diagnosis not present

## 2021-08-21 ENCOUNTER — Telehealth: Payer: Self-pay | Admitting: *Deleted

## 2021-08-21 NOTE — Care Plan (Signed)
[facility] RNCM call to patient to discuss his upcoming Right total knee arthroplasty with Dr. Magnus Ivan on 08/22/21. He is an Ortho bundle patient through [facility] and is agreeable to case management. He lives with his spouse, who will be assisting at home after discharge. He has a RW and 3in1/BSC. No DME needed. Anticipate HHPT will be needed after discharge. Referral made to [facility] after choice provided. Patient would like to go to [facility] in [place] when appropriate. CM will assist with referral and scheduling. Reviewed all post op care instructions. Will continue to follow for needs. ?

## 2021-08-21 NOTE — Telephone Encounter (Signed)
Ortho bundle pre-op call completed. 

## 2021-08-21 NOTE — H&P (Signed)
TOTAL KNEE ADMISSION H&P ? ?Patient is being admitted for right total knee arthroplasty. ? ?Subjective: ? ?Chief Complaint:right knee pain. ? ?HPI: [name]., 81 y.o. male, has a history of pain and functional disability in the right knee due to arthritis and has failed non-surgical conservative treatments for greater than 12 weeks to includeNSAID's and/or analgesics, corticosteriod injections, viscosupplementation injections, flexibility and strengthening excercises, and activity modification.  Onset of symptoms was gradual, starting 5 years ago with gradually worsening course since that time. The patient noted prior procedures on the knee to include  arthroscopy on the right knee(s).  Patient currently rates pain in the right knee(s) at 9 out of 10 with activity. Patient has night pain, worsening of pain with activity and weight bearing, pain that interferes with activities of daily living, pain with passive range of motion, crepitus, and joint swelling.  Patient has evidence of subchondral sclerosis, periarticular osteophytes, and joint space narrowing by imaging studies. There is no active infection. ? ?Patient Active Problem List  ? Diagnosis Date Noted  ? Unilateral primary osteoarthritis, right knee 10/19/2020  ? Facet joint disease of lumbosacral region 08/12/2017  ? Chronic bilateral low back pain 06/24/2017  ? Status post arthroscopy of right knee 06/06/2017  ? Acute pain of right knee 09/10/2016  ? ?Past Medical History:  ?Diagnosis Date  ? BPH (benign prostatic hyperplasia)   ? Chronic kidney disease   ? kidney stones  ? Headache(784.0)   ? migraines  ? History of kidney stones   ? Hyperlipidemia   ? Hypertension   ?  ?Past Surgical History:  ?Procedure Laterality Date  ? ACHILLES TENDON SURGERY    ? APPENDECTOMY    ? BACK SURGERY    ? COLONOSCOPY    ? HERNIA REPAIR    ? INGUINAL HERNIA REPAIR Right 05/26/2013  ? Procedure: RIGHT INGUINAL HERNIA REPAIR ;  Surgeon: Joyice Faster. Cornett, MD;  Location: [facility];  Service: General;  Laterality: Right;  ? INSERTION OF MESH Right 05/26/2013  ? Procedure: INSERTION OF MESH;  Surgeon: Joyice Faster. Cornett, MD;  Location: [facility];  Service: General;  Laterality: Right;  ? NASAL SEPTUM SURGERY    ? right hand surgery    ? TONSILLECTOMY    ?  ?No current facility-administered medications for this encounter.  ? ?Current Outpatient Medications  ?Medication Sig Dispense Refill Last Dose  ? acetaminophen (TYLENOL) 325 MG tablet Take 650 mg by mouth every 6 (six) hours as needed for moderate pain.     ? bisoprolol-hydrochlorothiazide (ZIAC) 10-6.25 MG per tablet Take 1 tablet by mouth daily.     ? finasteride (PROSCAR) 5 MG tablet Take 5 mg by mouth daily.     ? irbesartan (AVAPRO) 150 MG tablet Take 150 mg by mouth daily.     ? naproxen sodium (ALEVE) 220 MG tablet Take 220 mg by mouth daily as needed (pain).     ? tamsulosin (FLOMAX) 0.4 MG CAPS capsule Take by mouth.     ? ?No Known Allergies  ?Social History  ? ?Tobacco Use  ? Smoking status: Former  ?  Packs/day: 1.00  ?  Years: 5.00  ?  Pack years: 5.00  ?  Types: Cigarettes  ?  Quit date: 05/21/1964  ?  Years since quitting: 57.2  ? Smokeless tobacco: Never  ?Substance Use Topics  ? Alcohol use: No  ?  ?Family History  ?Problem Relation Age of Onset  ? Diabetes Father   ?  Hypertension Father   ? Heart disease Father   ? Uterine cancer Mother   ? Hypertension Mother   ?  ? ?Review of Systems  ?Musculoskeletal:  Positive for back pain, gait problem and joint swelling.  ?All other systems reviewed and are negative. ? ?Objective: ? ?Physical Exam ?Vitals reviewed.  ?Constitutional:   ?   Appearance: Normal appearance.  ?HENT:  ?   Head: Normocephalic and atraumatic.  ?Eyes:  ?   Extraocular Movements: Extraocular movements intact.  ?   Pupils: Pupils are equal, round, and reactive to light.  ?Cardiovascular:  ?   Rate and Rhythm: Normal rate.  ?   Pulses: Normal pulses.  ?Pulmonary:  ?   Effort: Pulmonary effort is normal.  ?Abdominal:  ?    Palpations: Abdomen is soft.  ?Musculoskeletal:  ?   Cervical back: Normal range of motion and neck supple.  ?   Right knee: Effusion, bony tenderness and crepitus present. Decreased range of motion. Tenderness present over the medial joint line, lateral joint line and patellar tendon. Abnormal alignment and abnormal meniscus.  ?Neurological:  ?   Mental Status: He is oriented to person, place, and time.  ?Psychiatric:     ?   Behavior: Behavior normal.  ? ? ?Vital signs in last 24 hours: ?  ? ?Labs: ? ? ?Estimated body mass index is 31.84 kg/m? as calculated from the following: ?  Height as of 08/14/21: 5\' 8"  (1.727 m). ?  Weight as of 08/14/21: 95 kg. ? ? ?Imaging Review ?Plain radiographs demonstrate severe degenerative joint disease of the right knee(s). The overall alignment ismild varus. The bone quality appears to be good for age and reported activity level. ? ? ? ? ? ?Assessment/Plan: ? ?End stage arthritis, right knee  ? ?The patient history, physical examination, clinical judgment of the provider and imaging studies are consistent with end stage degenerative joint disease of the right knee(s) and total knee arthroplasty is deemed medically necessary. The treatment options including medical management, injection therapy arthroscopy and arthroplasty were discussed at length. The risks and benefits of total knee arthroplasty were presented and reviewed. The risks due to aseptic loosening, infection, stiffness, patella tracking problems, thromboembolic complications and other imponderables were discussed. The patient acknowledged the explanation, agreed to proceed with the plan and consent was signed. Patient is being admitted for inpatient treatment for surgery, pain control, PT, OT, prophylactic antibiotics, VTE prophylaxis, progressive ambulation and ADL's and discharge planning. The patient is planning to be discharged home with home health services ? ? ? ? ?

## 2021-08-22 ENCOUNTER — Inpatient Hospital Stay (HOSPITAL_COMMUNITY)
Admission: RE | Admit: 2021-08-22 | Discharge: 2021-08-25 | DRG: 470 | Disposition: A | Discharge status: Home / Self Care | Payer: Medicare Other | Attending: Orthopaedic Surgery | Admitting: Orthopaedic Surgery

## 2021-08-22 ENCOUNTER — Encounter (HOSPITAL_COMMUNITY): Payer: Self-pay | Admitting: Orthopaedic Surgery

## 2021-08-22 ENCOUNTER — Other Ambulatory Visit: Payer: Self-pay

## 2021-08-22 ENCOUNTER — Ambulatory Visit (HOSPITAL_COMMUNITY): Payer: Medicare Other | Admitting: Anesthesiology

## 2021-08-22 ENCOUNTER — Ambulatory Visit (HOSPITAL_COMMUNITY): Payer: Medicare Other | Admitting: Physician Assistant

## 2021-08-22 ENCOUNTER — Observation Stay (HOSPITAL_COMMUNITY): Payer: Medicare Other

## 2021-08-22 ENCOUNTER — Encounter (HOSPITAL_COMMUNITY)
Admission: RE | Disposition: A | Discharge status: Home / Self Care | Payer: Self-pay | Source: Home / Self Care | Attending: Orthopaedic Surgery

## 2021-08-22 DIAGNOSIS — N400 Enlarged prostate without lower urinary tract symptoms: Secondary | ICD-10-CM | POA: Diagnosis present

## 2021-08-22 DIAGNOSIS — M1711 Unilateral primary osteoarthritis, right knee: Secondary | ICD-10-CM | POA: Diagnosis not present

## 2021-08-22 DIAGNOSIS — M21371 Foot drop, right foot: Secondary | ICD-10-CM | POA: Diagnosis not present

## 2021-08-22 DIAGNOSIS — Z87891 Personal history of nicotine dependence: Secondary | ICD-10-CM | POA: Diagnosis not present

## 2021-08-22 DIAGNOSIS — E785 Hyperlipidemia, unspecified: Secondary | ICD-10-CM | POA: Diagnosis not present

## 2021-08-22 DIAGNOSIS — Z8049 Family history of malignant neoplasm of other genital organs: Secondary | ICD-10-CM

## 2021-08-22 DIAGNOSIS — Z8249 Family history of ischemic heart disease and other diseases of the circulatory system: Secondary | ICD-10-CM

## 2021-08-22 DIAGNOSIS — M545 Low back pain, unspecified: Secondary | ICD-10-CM | POA: Diagnosis not present

## 2021-08-22 DIAGNOSIS — Z87442 Personal history of urinary calculi: Secondary | ICD-10-CM | POA: Diagnosis not present

## 2021-08-22 DIAGNOSIS — I10 Essential (primary) hypertension: Secondary | ICD-10-CM | POA: Diagnosis not present

## 2021-08-22 DIAGNOSIS — Z79899 Other long term (current) drug therapy: Secondary | ICD-10-CM

## 2021-08-22 DIAGNOSIS — Z833 Family history of diabetes mellitus: Secondary | ICD-10-CM

## 2021-08-22 DIAGNOSIS — Z471 Aftercare following joint replacement surgery: Secondary | ICD-10-CM | POA: Diagnosis not present

## 2021-08-22 DIAGNOSIS — G8929 Other chronic pain: Secondary | ICD-10-CM | POA: Diagnosis present

## 2021-08-22 DIAGNOSIS — Z96651 Presence of right artificial knee joint: Secondary | ICD-10-CM

## 2021-08-22 DIAGNOSIS — Z9889 Other specified postprocedural states: Secondary | ICD-10-CM | POA: Diagnosis not present

## 2021-08-22 DIAGNOSIS — G8918 Other acute postprocedural pain: Secondary | ICD-10-CM | POA: Diagnosis not present

## 2021-08-22 HISTORY — PX: TOTAL KNEE ARTHROPLASTY: SHX125

## 2021-08-22 SURGERY — ARTHROPLASTY, KNEE, TOTAL
Anesthesia: Monitor Anesthesia Care | Site: Knee | Laterality: Right

## 2021-08-22 MED ORDER — IRBESARTAN 150 MG PO TABS
150.0000 mg | ORAL_TABLET | Freq: Every day | ORAL | Status: DC
  Administered 2021-08-22 – 2021-08-25 (×4): 150 mg via ORAL
  Filled 2021-08-22 (×4): qty 1

## 2021-08-22 MED ORDER — DEXAMETHASONE SODIUM PHOSPHATE 10 MG/ML IJ SOLN
INTRAMUSCULAR | Status: DC | PRN
  Administered 2021-08-22: 10 mg via INTRAVENOUS

## 2021-08-22 MED ORDER — SODIUM CHLORIDE 0.9 % IR SOLN
Status: DC | PRN
  Administered 2021-08-22: 1000 mL

## 2021-08-22 MED ORDER — DEXAMETHASONE SODIUM PHOSPHATE 10 MG/ML IJ SOLN
INTRAMUSCULAR | Status: AC
  Filled 2021-08-22: qty 1

## 2021-08-22 MED ORDER — FENTANYL CITRATE (PF) 250 MCG/5ML IJ SOLN
INTRAMUSCULAR | Status: AC
Start: 2021-08-22 — End: ?
  Filled 2021-08-22: qty 5

## 2021-08-22 MED ORDER — METHOCARBAMOL 500 MG PO TABS
500.0000 mg | ORAL_TABLET | Freq: Four times a day (QID) | ORAL | Status: DC | PRN
  Administered 2021-08-23 – 2021-08-24 (×2): 500 mg via ORAL
  Filled 2021-08-22 (×2): qty 1

## 2021-08-22 MED ORDER — MIDAZOLAM HCL 2 MG/2ML IJ SOLN
INTRAMUSCULAR | Status: AC
  Filled 2021-08-22: qty 2

## 2021-08-22 MED ORDER — BISOPROLOL-HYDROCHLOROTHIAZIDE 10-6.25 MG PO TABS
1.0000 | ORAL_TABLET | Freq: Every day | ORAL | Status: DC
  Administered 2021-08-22 – 2021-08-25 (×4): 1 via ORAL
  Filled 2021-08-22 (×4): qty 1

## 2021-08-22 MED ORDER — METOCLOPRAMIDE HCL 5 MG PO TABS: 5.0000 mg | ORAL_TABLET | Freq: Three times a day (TID) | ORAL | Status: DC | PRN

## 2021-08-22 MED ORDER — HYDROMORPHONE HCL 1 MG/ML IJ SOLN
0.5000 mg | INTRAMUSCULAR | Status: DC | PRN
  Administered 2021-08-22 – 2021-08-24 (×5): 1 mg via INTRAVENOUS
  Filled 2021-08-22 (×6): qty 1

## 2021-08-22 MED ORDER — PANTOPRAZOLE SODIUM 40 MG PO TBEC
40.0000 mg | DELAYED_RELEASE_TABLET | Freq: Every day | ORAL | Status: DC
  Administered 2021-08-22 – 2021-08-25 (×4): 40 mg via ORAL
  Filled 2021-08-22 (×4): qty 1

## 2021-08-22 MED ORDER — ALUM & MAG HYDROXIDE-SIMETH 200-200-20 MG/5ML PO SUSP: 30.0000 mL | ORAL | Status: DC | PRN

## 2021-08-22 MED ORDER — FENTANYL CITRATE (PF) 100 MCG/2ML IJ SOLN: 25.0000 ug | INTRAMUSCULAR | Status: DC | PRN

## 2021-08-22 MED ORDER — ACETAMINOPHEN 325 MG PO TABS
325.0000 mg | ORAL_TABLET | Freq: Four times a day (QID) | ORAL | Status: DC | PRN
  Administered 2021-08-23: 650 mg via ORAL
  Filled 2021-08-22: qty 2

## 2021-08-22 MED ORDER — DEXMEDETOMIDINE (PRECEDEX) IN NS 20 MCG/5ML (4 MCG/ML) IV SYRINGE
PREFILLED_SYRINGE | INTRAVENOUS | Status: AC
  Filled 2021-08-22: qty 5

## 2021-08-22 MED ORDER — ORAL CARE MOUTH RINSE: 15.0000 mL | Freq: Once | OROMUCOSAL | Status: AC

## 2021-08-22 MED ORDER — TAMSULOSIN HCL 0.4 MG PO CAPS
0.4000 mg | ORAL_CAPSULE | Freq: Every day | ORAL | Status: DC
  Administered 2021-08-23 – 2021-08-24 (×2): 0.4 mg via ORAL
  Filled 2021-08-22 (×2): qty 1

## 2021-08-22 MED ORDER — ONDANSETRON HCL 4 MG/2ML IJ SOLN: 4.0000 mg | Freq: Four times a day (QID) | INTRAMUSCULAR | Status: DC | PRN

## 2021-08-22 MED ORDER — DIPHENHYDRAMINE HCL 12.5 MG/5ML PO ELIX: 12.5000 mg | ORAL_SOLUTION | ORAL | Status: DC | PRN

## 2021-08-22 MED ORDER — FENTANYL CITRATE (PF) 100 MCG/2ML IJ SOLN: 50.0000 ug | Freq: Once | INTRAMUSCULAR | Status: AC

## 2021-08-22 MED ORDER — ASPIRIN 81 MG PO CHEW
81.0000 mg | CHEWABLE_TABLET | Freq: Two times a day (BID) | ORAL | Status: DC
  Administered 2021-08-22 – 2021-08-25 (×6): 81 mg via ORAL
  Filled 2021-08-22 (×6): qty 1

## 2021-08-22 MED ORDER — DOCUSATE SODIUM 100 MG PO CAPS
100.0000 mg | ORAL_CAPSULE | Freq: Two times a day (BID) | ORAL | Status: DC
  Administered 2021-08-22 – 2021-08-25 (×6): 100 mg via ORAL
  Filled 2021-08-22 (×6): qty 1

## 2021-08-22 MED ORDER — FENTANYL CITRATE (PF) 100 MCG/2ML IJ SOLN
INTRAMUSCULAR | Status: DC | PRN
  Administered 2021-08-22: 50 ug via INTRAVENOUS

## 2021-08-22 MED ORDER — PROPOFOL 10 MG/ML IV BOLUS
INTRAVENOUS | Status: DC | PRN
  Administered 2021-08-22: 20 mg via INTRAVENOUS

## 2021-08-22 MED ORDER — FINASTERIDE 5 MG PO TABS
5.0000 mg | ORAL_TABLET | Freq: Every day | ORAL | Status: DC
  Administered 2021-08-23 – 2021-08-25 (×3): 5 mg via ORAL
  Filled 2021-08-22 (×3): qty 1

## 2021-08-22 MED ORDER — PHENOL 1.4 % MT LIQD: 1.0000 | OROMUCOSAL | Status: DC | PRN

## 2021-08-22 MED ORDER — OXYCODONE HCL 5 MG/5ML PO SOLN: 5.0000 mg | Freq: Once | ORAL | Status: DC | PRN

## 2021-08-22 MED ORDER — PHENYLEPHRINE HCL-NACL 20-0.9 MG/250ML-% IV SOLN
INTRAVENOUS | Status: DC | PRN
  Administered 2021-08-22: 75 ug/min via INTRAVENOUS

## 2021-08-22 MED ORDER — LACTATED RINGERS IV SOLN: INTRAVENOUS | Status: DC

## 2021-08-22 MED ORDER — ROPIVACAINE HCL 5 MG/ML IJ SOLN
INTRAMUSCULAR | Status: DC | PRN
  Administered 2021-08-22: 25 mL via PERINEURAL

## 2021-08-22 MED ORDER — TRANEXAMIC ACID-NACL 1000-0.7 MG/100ML-% IV SOLN
INTRAVENOUS | Status: DC | PRN
  Administered 2021-08-22: 1000 mg via INTRAVENOUS

## 2021-08-22 MED ORDER — ONDANSETRON HCL 4 MG PO TABS: 4.0000 mg | ORAL_TABLET | Freq: Four times a day (QID) | ORAL | Status: DC | PRN

## 2021-08-22 MED ORDER — FENTANYL CITRATE (PF) 100 MCG/2ML IJ SOLN
INTRAMUSCULAR | Status: AC
  Administered 2021-08-22: 50 ug via INTRAVENOUS
  Filled 2021-08-22: qty 2

## 2021-08-22 MED ORDER — MENTHOL 3 MG MT LOZG: 1.0000 | LOZENGE | OROMUCOSAL | Status: DC | PRN

## 2021-08-22 MED ORDER — ONDANSETRON HCL 4 MG/2ML IJ SOLN
INTRAMUSCULAR | Status: DC | PRN
  Administered 2021-08-22: 4 mg via INTRAVENOUS

## 2021-08-22 MED ORDER — CEFAZOLIN SODIUM-DEXTROSE 2-4 GM/100ML-% IV SOLN
INTRAVENOUS | Status: AC
  Administered 2021-08-22: 2000 mg
  Filled 2021-08-22: qty 100

## 2021-08-22 MED ORDER — PROPOFOL 10 MG/ML IV BOLUS
INTRAVENOUS | Status: AC
  Filled 2021-08-22: qty 20

## 2021-08-22 MED ORDER — ONDANSETRON HCL 4 MG/2ML IJ SOLN
INTRAMUSCULAR | Status: AC
  Filled 2021-08-22: qty 2

## 2021-08-22 MED ORDER — 0.9 % SODIUM CHLORIDE (POUR BTL) OPTIME
TOPICAL | Status: DC | PRN
  Administered 2021-08-22: 1000 mL

## 2021-08-22 MED ORDER — OXYCODONE HCL 5 MG PO TABS: 5.0000 mg | ORAL_TABLET | Freq: Once | ORAL | Status: DC | PRN

## 2021-08-22 MED ORDER — SODIUM CHLORIDE 0.9 % IV SOLN: INTRAVENOUS | Status: DC

## 2021-08-22 MED ORDER — OXYCODONE HCL 5 MG PO TABS
10.0000 mg | ORAL_TABLET | ORAL | Status: DC | PRN
  Administered 2021-08-22 – 2021-08-23 (×2): 10 mg via ORAL
  Administered 2021-08-24: 15 mg via ORAL
  Administered 2021-08-24: 10 mg via ORAL
  Administered 2021-08-25 (×2): 15 mg via ORAL
  Filled 2021-08-22 (×4): qty 3
  Filled 2021-08-22: qty 2
  Filled 2021-08-22: qty 3

## 2021-08-22 MED ORDER — METOCLOPRAMIDE HCL 5 MG/ML IJ SOLN: 5.0000 mg | Freq: Three times a day (TID) | INTRAMUSCULAR | Status: DC | PRN

## 2021-08-22 MED ORDER — EPHEDRINE SULFATE (PRESSORS) 50 MG/ML IJ SOLN
INTRAMUSCULAR | Status: DC | PRN
Start: 2021-08-22 — End: 2021-08-22
  Administered 2021-08-22 (×2): 5 mg via INTRAVENOUS

## 2021-08-22 MED ORDER — TRANEXAMIC ACID-NACL 1000-0.7 MG/100ML-% IV SOLN
INTRAVENOUS | Status: AC
  Filled 2021-08-22: qty 100

## 2021-08-22 MED ORDER — METHOCARBAMOL 1000 MG/10ML IJ SOLN
500.0000 mg | Freq: Four times a day (QID) | INTRAVENOUS | Status: DC | PRN
  Filled 2021-08-22: qty 5

## 2021-08-22 MED ORDER — OXYCODONE HCL 5 MG PO TABS
5.0000 mg | ORAL_TABLET | ORAL | Status: DC | PRN
  Administered 2021-08-23: 10 mg via ORAL
  Filled 2021-08-22 (×3): qty 2

## 2021-08-22 MED ORDER — CHLORHEXIDINE GLUCONATE 0.12 % MT SOLN
15.0000 mL | Freq: Once | OROMUCOSAL | Status: AC
  Administered 2021-08-22: 15 mL via OROMUCOSAL
  Filled 2021-08-22: qty 15

## 2021-08-22 MED ORDER — CEFAZOLIN SODIUM-DEXTROSE 1-4 GM/50ML-% IV SOLN
1.0000 g | Freq: Four times a day (QID) | INTRAVENOUS | Status: AC
  Administered 2021-08-22: 1 g via INTRAVENOUS
  Filled 2021-08-22 (×2): qty 50

## 2021-08-22 MED ORDER — PROPOFOL 500 MG/50ML IV EMUL
INTRAVENOUS | Status: DC | PRN
  Administered 2021-08-22: 50 ug/kg/min via INTRAVENOUS

## 2021-08-22 SURGICAL SUPPLY — 74 items
BAG COUNTER SPONGE SURGICOUNT (BAG) ×2 IMPLANT
BAG SPNG CNTER NS LX DISP (BAG) ×1
BANDAGE ESMARK 6X9 LF (GAUZE/BANDAGES/DRESSINGS) ×1 IMPLANT
BLADE SAG 18X100X1.27 (BLADE) ×2 IMPLANT
BNDG CMPR 9X6 STRL LF SNTH (GAUZE/BANDAGES/DRESSINGS) ×1
BNDG ELASTIC 6X5.8 VLCR STR LF (GAUZE/BANDAGES/DRESSINGS) ×3 IMPLANT
BNDG ESMARK 6X9 LF (GAUZE/BANDAGES/DRESSINGS) ×2
BOWL SMART MIX CTS (DISPOSABLE) ×2 IMPLANT
BSPLAT TIB 5D F CMNT STM RT (Knees) ×1 IMPLANT
CEMENT BONE R 1X40 (Cement) ×2 IMPLANT
COMP FEM CMT PERSONA SZ9 RT (Joint) ×2 IMPLANT
COMPONENT FEM CMT PRSONA SZ9RT (Joint) IMPLANT
COVER SURGICAL LIGHT HANDLE (MISCELLANEOUS) ×2 IMPLANT
CUFF TOURN SGL QUICK 34 (TOURNIQUET CUFF) ×2
CUFF TOURN SGL QUICK 42 (TOURNIQUET CUFF) IMPLANT
CUFF TRNQT CYL 34X4.125X (TOURNIQUET CUFF) ×1 IMPLANT
DRAPE EXTREMITY T 121X128X90 (DISPOSABLE) ×2 IMPLANT
DRAPE HALF SHEET 40X57 (DRAPES) ×2 IMPLANT
DRAPE U-SHAPE 47X51 STRL (DRAPES) ×2 IMPLANT
DRSG PAD ABDOMINAL 8X10 ST (GAUZE/BANDAGES/DRESSINGS) ×2 IMPLANT
DURAPREP 26ML APPLICATOR (WOUND CARE) ×2 IMPLANT
ELECT CAUTERY BLADE 6.4 (BLADE) ×2 IMPLANT
ELECT REM PT RETURN 9FT ADLT (ELECTROSURGICAL) ×2
ELECTRODE REM PT RTRN 9FT ADLT (ELECTROSURGICAL) ×1 IMPLANT
FACESHIELD WRAPAROUND (MASK) ×4 IMPLANT
FACESHIELD WRAPAROUND OR TEAM (MASK) ×2 IMPLANT
GAUZE SPONGE 4X4 12PLY STRL (GAUZE/BANDAGES/DRESSINGS) ×2 IMPLANT
GAUZE XEROFORM 1X8 LF (GAUZE/BANDAGES/DRESSINGS) ×2 IMPLANT
GAUZE XEROFORM 5X9 LF (GAUZE/BANDAGES/DRESSINGS) ×1 IMPLANT
GLOVE SRG 8 PF TXTR STRL LF DI (GLOVE) ×2 IMPLANT
GLOVE SURG ORTHO LTX SZ7.5 (GLOVE) ×2 IMPLANT
GLOVE SURG ORTHO LTX SZ8 (GLOVE) ×2 IMPLANT
GLOVE SURG UNDER POLY LF SZ8 (GLOVE) ×4
GOWN STRL REUS W/ TWL LRG LVL3 (GOWN DISPOSABLE) IMPLANT
GOWN STRL REUS W/ TWL XL LVL3 (GOWN DISPOSABLE) ×2 IMPLANT
GOWN STRL REUS W/TWL LRG LVL3 (GOWN DISPOSABLE)
GOWN STRL REUS W/TWL XL LVL3 (GOWN DISPOSABLE) ×4
HANDPIECE INTERPULSE COAX TIP (DISPOSABLE) ×2
HDLS TROCR DRIL PIN KNEE 75 (PIN) ×2
IMMOBILIZER KNEE 22 UNIV (SOFTGOODS) ×2 IMPLANT
INSERT TIB ARTISURF SZ8-11X12 (Insert) ×1 IMPLANT
KIT BASIN OR (CUSTOM PROCEDURE TRAY) ×2 IMPLANT
KIT TURNOVER KIT B (KITS) ×2 IMPLANT
MANIFOLD NEPTUNE II (INSTRUMENTS) ×2 IMPLANT
NDL 18GX1X1/2 (RX/OR ONLY) (NEEDLE) IMPLANT
NEEDLE 18GX1X1/2 (RX/OR ONLY) (NEEDLE) IMPLANT
NS IRRIG 1000ML POUR BTL (IV SOLUTION) ×2 IMPLANT
PACK TOTAL JOINT (CUSTOM PROCEDURE TRAY) ×2 IMPLANT
PAD ABD 8X10 STRL (GAUZE/BANDAGES/DRESSINGS) ×1 IMPLANT
PAD ARMBOARD 7.5X6 YLW CONV (MISCELLANEOUS) ×2 IMPLANT
PADDING CAST COTTON 6X4 STRL (CAST SUPPLIES) ×2 IMPLANT
PIN DRILL HDLS TROCAR 75 4PK (PIN) IMPLANT
SCREW FEMALE HEX FIX 25X2.5 (ORTHOPEDIC DISPOSABLE SUPPLIES) ×1 IMPLANT
SET HNDPC FAN SPRY TIP SCT (DISPOSABLE) ×1 IMPLANT
SET PAD KNEE POSITIONER (MISCELLANEOUS) ×2 IMPLANT
STAPLER VISISTAT 35W (STAPLE) IMPLANT
STEM POLY PAT PLY 35M KNEE (Knees) ×1 IMPLANT
STEM TIBIA 5 DEG SZ F R KNEE (Knees) IMPLANT
STRIP CLOSURE SKIN 1/2X4 (GAUZE/BANDAGES/DRESSINGS) IMPLANT
SUCTION FRAZIER HANDLE 10FR (MISCELLANEOUS) ×2
SUCTION TUBE FRAZIER 10FR DISP (MISCELLANEOUS) ×1 IMPLANT
SUT MNCRL AB 4-0 PS2 18 (SUTURE) IMPLANT
SUT VIC AB 0 CT1 27 (SUTURE) ×2
SUT VIC AB 0 CT1 27XBRD ANBCTR (SUTURE) ×1 IMPLANT
SUT VIC AB 1 CT1 27 (SUTURE) ×4
SUT VIC AB 1 CT1 27XBRD ANBCTR (SUTURE) ×2 IMPLANT
SUT VIC AB 2-0 CT1 27 (SUTURE) ×4
SUT VIC AB 2-0 CT1 TAPERPNT 27 (SUTURE) ×2 IMPLANT
SYR 50ML LL SCALE MARK (SYRINGE) IMPLANT
TIBIA STEM 5 DEG SZ F R KNEE (Knees) ×2 IMPLANT
TOWEL GREEN STERILE (TOWEL DISPOSABLE) ×2 IMPLANT
TOWEL GREEN STERILE FF (TOWEL DISPOSABLE) ×2 IMPLANT
TRAY CATH 16FR W/PLASTIC CATH (SET/KITS/TRAYS/PACK) IMPLANT
WRAP KNEE MAXI GEL POST OP (GAUZE/BANDAGES/DRESSINGS) ×2 IMPLANT

## 2021-08-22 NOTE — Interval H&P Note (Signed)
History and Physical Interval Note: The patient is fully aware that he is here today for a right knee replacement to treat his right knee osteoarthritis.  There is been no acute or interval change in his medical status.  Please see recent H&P.  The risks and benefits of surgery been explained in detail and informed consent was obtained.  The right operative knee has been marked. ? ?08/22/2021 ?8:44 AM ? ?R Delray Alt.  has presented today for surgery, with the diagnosis of osteoarthritis right knee.  The various methods of treatment have been discussed with the patient and family. After consideration of risks, benefits and other options for treatment, the patient has consented to  Procedure(s): ?RIGHT TOTAL KNEE ARTHROPLASTY (Right) as a surgical intervention.  The patient's history has been reviewed, patient examined, no change in status, stable for surgery.  I have reviewed the patient's chart and labs.  Questions were answered to the patient's satisfaction.   ? ? ?Kathryne Hitch ? ? ?

## 2021-08-22 NOTE — Anesthesia Procedure Notes (Signed)
Anesthesia Regional Block: Adductor canal block  ? ?Pre-Anesthetic Checklist: , timeout performed,  Correct Patient, Correct Site, Correct Laterality,  Correct Procedure, Correct Position, site marked,  Risks and benefits discussed,  Surgical consent,  Pre-op evaluation,  At surgeon's request and post-op pain management ? ?Laterality: Right ? ?Prep: chloraprep     ?  ?Needles:  ?Injection technique: Single-shot ? ?Needle Type: Echogenic Needle   ? ? ?Needle Length: 9cm  ?Needle Gauge: 21  ? ? ? ?Additional Needles: ? ? ?Narrative:  ?Start time: 08/22/2021 8:54 AM ?End time: 08/22/2021 9:03 AM ?Injection made incrementally with aspirations every 5 mL. ? ?Performed by: Personally  ?Anesthesiologist: Achille Rich, MD ? ?Additional Notes: ?Pt tolerated the procedure well. ? ? ? ? ?

## 2021-08-22 NOTE — Op Note (Signed)
NAMESOVEREIGN, [name] [name]: [id number] ?ACCOUNT [name]: [id number] ?DATE OF BIRTH: [birth year] ?FACILITY: [facility] ?LOCATION: [facility] ?PHYSICIAN: Vanita Panda. Magnus Ivan, MD ? ?Operative Report  ? ?DATE OF PROCEDURE: 08/22/2021 ? ?PREOPERATIVE DIAGNOSES:  Primary osteoarthritis and degenerative joint disease, right knee. ? ?POSTOPERATIVE DIAGNOSES:  Primary osteoarthritis and degenerative joint disease, right knee. ? ?PROCEDURE:  Right total knee arthroplasty. ? ?IMPLANTS:  Biomet/Zimmer Persona cemented knee system with size 9 right CR standard femur, size right F tibia tray, 12 mm medial congruent fixed bearing polyethylene insert, 35 mm patellar button. ? ?SURGEON:  Vanita Panda. Magnus Ivan, MD ? ?ASSISTANT:  Richardean Canal, PA-C ? ?ANESTHESIA:  ?1.  Right lower extremity adductor canal block. ?2.  Spinal. ? ?ANTIBIOTICS:  2 g IV Ancef. ? ?TOURNIQUET TIME:  51 minutes. ? ?BLOOD LOSS:  Less than 100 mL ? ?COMPLICATIONS:  None. ? ?INDICATIONS:  The patient is a 81 year old gentleman well known to me.  I have seen him for many years as it relates to his right knee and his osteoarthritis of the right knee.  He has tried and failed all forms of conservative treatment. At this point,  ?his right knee pain is daily and it is detrimentally affecting his mobility, his quality of life and his activities of daily living to the point he does wish to proceed with the knee replacement, and we have recommended this as well.  We talked in length ? and detail about the risk of acute blood loss anemia, nerve and vessel injury, fracture, infection, DVT, implant failure and skin and soft tissue issues.  We talked about our goals being decrease pain, improve mobility and overall improve quality of  ?life. ? ?DESCRIPTION OF PROCEDURE:  After informed consent was obtained, appropriate right knee was marked and adductor canal block was obtained in the right lower extremity in the holding room.  He was then brought to the operating room and  sat up on the  ?operating table where spinal anesthesia was obtained.  He was laid in the supine position on the operating table.  Foley catheter was placed and a nonsterile tourniquet was placed around his upper right thigh.  His right thigh, knee, leg, ankle and foot  ?were prepped and draped with DuraPrep and sterile drapes.  A timeout was called and he was identified as correct patient, correct right knee.  We then used Esmarch to wrap that leg and tourniquet was inflated to 300 mm pressure.  I then made a direct  ?midline incision over the patella and carried it proximally and distally.  We dissected down the knee joint, carried out a medial parapatellar arthrotomy.  With the knee in a flexed position, we removed remnants of the ACL, medial and lateral meniscus  ?and osteophytes in all three compartments.  We then set our extramedullary cutting guide for taking 8 mm off the high side, correcting for varus and valgus in a 3-degree slope.  We made this cut without difficulty, but it felt like it was tight, so we  ?backed it down 2 more millimeters.  We then used a distal femoral cutting block based off the epicondylar axis and an intramedullary guide for a 10 mm distal femoral cut for the right knee at 5 degrees externally rotated.  We made this cut without  ?difficulty, and brought the knee back down to full extension and with a 10 mm extension block, had achieved full extension.  We then went back to the femur and put our femoral sizing guide  based off the epicondylar axis.  Based off of this, we chose a  ?size 9 femur.  We put a 4-in-1 cutting block for a size 9 femur and made our anterior and posterior cuts, followed by our chamfer cuts.  We then went back to the tibia and chose a size right F tibial tray for coverage on the tibial plateau, setting the  ?rotation off the tibial tubercle and the femur.  We then made our keel punch and drill hole off of this.  With a size trial F left tibia, we trialed a size 9  right standard CR femur and a 9 mm medial congruent fixed bearing polyethylene insert trial.  We ? went up to a 12 mm trial.  We were pleased with range of motion and stability with 12 mm trial.  We then made our patellar cut and drilled three holes for a size 35 patellar button.  With all trial instrumentation in the knee, I was pleased with range  ?of motion and stability.  We then removed all trial instrumentation from the knee and irrigated the knee with normal saline solution using pulsatile lavage.  We dried the knee real well and mixed our cement.  With the knee in a flexed position, we then  ?cemented our Biomet, Zimmer Persona tibial tray size F for right knee followed by our size right standard CR femur, size 9.  We placed our 12 mm fixed bearing medial congruent polyethylene insert and cemented our 35 mm patellar button.  We then removed  ?all excess cement debris from the knee and held the knee compressed and fully extended while the cement hardened.  Once it hardened, we put the knee through range of motion.  We were pleased with range of motion and stability.  We irrigated the knee  ?again with normal saline solution and then let the tourniquet down.  Hemostasis was obtained with electrocautery.  We closed the arthrotomy with interrupted #1 Vicryl suture followed by 0 Vicryl to close the deep tissue and 2-0 Vicryl to close the  ?subcutaneous tissue.  The skin was closed with staples.  A well-padded sterile dressing was applied.  He was taken to recovery room in stable condition with all final counts being correct.  [name] complications noted.  Of note, Rexene Edison, PA-C, assisted from ? the beginning to the end of this case.  His assistance was crucial for helping mobilize soft tissues and retracting the soft tissues as well as guiding implant placement.  He was involved with the layered closure of the wound.  His involvement in this  ?case was medically necessary. ? ? ?SHW ?D: 08/22/2021 11:06:47 am T:  08/22/2021 11:27:00 pm  ?JOB: [id number]/ [id number]  ?

## 2021-08-22 NOTE — Transfer of Care (Signed)
Immediate Anesthesia Transfer of Care Note ? ?Patient: [name] [name]. ? ?Procedure(s) Performed: RIGHT TOTAL KNEE ARTHROPLASTY (Right: Knee) ? ?Patient Location: PACU ? ?Anesthesia Type:Spinal ? ?Level of Consciousness: awake, alert , oriented and patient cooperative ? ?Airway & Oxygen Therapy: Patient Spontanous Breathing and Patient connected to nasal cannula oxygen ? ?Post-op Assessment: Report given to RN and Post -op Vital signs reviewed and stable ? ?Post vital signs: Reviewed and stable ? ?Last Vitals:  ?Vitals Value Taken Time  ?BP 110/66 08/22/21 1128  ?Temp    ?Pulse 68 08/22/21 1133  ?Resp 10 08/22/21 1133  ?SpO2 98 % 08/22/21 1133  ?Vitals shown include unvalidated device data. ? ?Last Pain:  ?Vitals:  ? 08/22/21 0811  ?TempSrc:   ?PainSc: 8   ?   ? ?Patients Stated Pain Goal: 0 (08/22/21 1610) ? ?Complications: No notable events documented. ?

## 2021-08-22 NOTE — Brief Op Note (Signed)
08/22/2021 ? ?11:08 AM ? ?PATIENT:  [name] [name].  81 y.o. adult ? ?PRE-OPERATIVE DIAGNOSIS:  osteoarthritis right knee ? ?POST-OPERATIVE DIAGNOSIS:  osteoarthritis right knee ? ?PROCEDURE:  Procedure(s): ?RIGHT TOTAL KNEE ARTHROPLASTY (Right) ? ?SURGEON:  Surgeon(s) and Role: ?   Kathryne Hitch, MD - Primary ? ?PHYSICIAN ASSISTANT:  Rexene Edison, PA-C ? ?ANESTHESIA:   regional and spinal ? ?EBL:  50 mL  ? ?COUNTS:  YES ? ?TOURNIQUET:  * Missing tourniquet times found for documented tourniquets in log: [id number] * ? ?DICTATION: .Other Dictation: Dictation Number [id number] ? ?PLAN OF CARE: Admit for overnight observation ? ?PATIENT DISPOSITION:  PACU - hemodynamically stable. ?  ?Delay start of Pharmacological VTE agent (>24hrs) due to surgical blood loss or risk of bleeding: no ? ?

## 2021-08-22 NOTE — Anesthesia Preprocedure Evaluation (Signed)
Anesthesia Evaluation  ?Patient identified by MRN, date of birth, ID band ?Patient awake ? ? ? ?Reviewed: ?Allergy & Precautions, H&P , NPO status , Patient's Chart, lab work & pertinent test results ? ?Airway ?Mallampati: II ? ? ?Neck ROM: full ? ? ? Dental ?  ?Pulmonary ?former smoker,  ?  ?breath sounds clear to auscultation ? ? ? ? ? ? Cardiovascular ?hypertension,  ?Rhythm:regular Rate:Normal ? ? ?  ?Neuro/Psych ? Headaches,   ? GI/Hepatic ?  ?Endo/Other  ? ? Renal/GU ?Renal InsufficiencyRenal diseasestones  ? ?  ?Musculoskeletal ? ?(+) Arthritis ,  ? Abdominal ?  ?Peds ? Hematology ?  ?Anesthesia Other Findings ? ? Reproductive/Obstetrics ? ?  ? ? ? ? ? ? ? ? ? ? ? ? ? ?  ?  ? ? ? ? ? ? ? ? ?Anesthesia Physical ?Anesthesia Plan ? ?ASA: 2 ? ?Anesthesia Plan: MAC and Spinal  ? ?Post-op Pain Management: Regional block*  ? ?Induction:  ? ?PONV Risk Score and Plan: 1 and Ondansetron, Propofol infusion and Treatment may vary due to age or medical condition ? ?Airway Management Planned: Simple Face Mask ? ?Additional Equipment:  ? ?Intra-op Plan:  ? ?Post-operative Plan:  ? ?Informed Consent: I have reviewed the patients History and Physical, chart, labs and discussed the procedure including the risks, benefits and alternatives for the proposed anesthesia with the patient or authorized representative who has indicated his/her understanding and acceptance.  ? ? ? ?Dental advisory given ? ?Plan Discussed with: CRNA, Anesthesiologist and Surgeon ? ?Anesthesia Plan Comments:   ? ? ? ? ? ? ?Anesthesia Quick Evaluation ? ?

## 2021-08-22 NOTE — Plan of Care (Signed)

## 2021-08-22 NOTE — Evaluation (Signed)
Physical Therapy Evaluation ?Patient Details ?Name: [name] [name]. ?MRN: [id number] ?DOB: Nov 24, 1940 ?Today's Date: 08/22/2021 ? ?History of Present Illness ? Pt is a 81 y/o male s/p R TKA. PMH includes HTN, CKD.  ?Clinical Impression ? Pt is s/p surgery above with deficits below. Pt with nerve block effects present, so mobility limited to chair. Requiring min to min guard A for mobility tasks using RW. Reviewed knee precautions with pt. Will continue to follow acutely.    ?   ? ?Recommendations for follow up therapy are one component of a multi-disciplinary discharge planning process, led by the attending physician.  Recommendations may be updated based on patient status, additional functional criteria and insurance authorization. ? ?Follow Up Recommendations Follow physician's recommendations for discharge plan and follow up therapies ? ?  ?Assistance Recommended at Discharge Intermittent Supervision/Assistance  ?Patient can return home with the following ? Help with stairs or ramp for entrance;Assistance with cooking/housework;Assist for transportation ? ?  ?Equipment Recommendations None recommended by PT  ?Recommendations for Other Services ?    ?  ?Functional Status Assessment Patient has had a recent decline in their functional status and demonstrates the ability to make significant improvements in function in a reasonable and predictable amount of time.  ? ?  ?Precautions / Restrictions Precautions ?Precautions: Knee ?Precaution Booklet Issued: No ?Precaution Comments: Verbally reviewed knee precautions. ?Restrictions ?Weight Bearing Restrictions: Yes ?RLE Weight Bearing: Weight bearing as tolerated  ? ?  ? ?Mobility ? Bed Mobility ?Overal bed mobility: Needs Assistance ?Bed Mobility: Supine to Sit ?  ?  ?Supine to sit: Min assist ?  ?  ?General bed mobility comments: Min A for RLE assist. ?  ? ?Transfers ?Overall transfer level: Needs assistance ?Equipment used: Rolling walker (2 wheels) ?Transfers: Sit  to/from Stand, Bed to chair/wheelchair/BSC ?Sit to Stand: Min guard ?Stand pivot transfers: Min guard ?  ?  ?  ?  ?General transfer comment: Min guard for safety to stand and transfer to chair. Pt with nerve block effects and R knee instability even with KI, so further mobility deferred. ?  ? ?Ambulation/Gait ?  ?  ?  ?  ?  ?  ?  ?  ? ?Stairs ?  ?  ?  ?  ?  ? ?Wheelchair Mobility ?  ? ?Modified Rankin (Stroke Patients Only) ?  ? ?  ? ?Balance Overall balance assessment: Needs assistance ?Sitting-balance support: No upper extremity supported, Feet supported ?Sitting balance-Leahy Scale: Fair ?  ?  ?Standing balance support: Bilateral upper extremity supported ?Standing balance-Leahy Scale: Poor ?Standing balance comment: Reliant on BUE support ?  ?  ?  ?  ?  ?  ?  ?  ?  ?  ?  ?   ? ? ? ?Pertinent Vitals/Pain Pain Assessment ?Pain Assessment: Faces ?Faces Pain Scale: Hurts little more ?Pain Location: R knee ?Pain Descriptors / Indicators: Grimacing, Guarding, Operative site guarding ?Pain Intervention(s): Limited activity within patient's tolerance, Monitored during session  ? ? ?Home Living Family/patient expects to be discharged to:: Private residence ?Living Arrangements: Spouse/significant other ?Available Help at Discharge: Family ?Type of Home: House ?Home Access: Stairs to enter ?Entrance Stairs-Rails: Right ?Entrance Stairs-Number of Steps: 5 ?  ?Home Layout: One level ?Home Equipment: Rolling Walker (2 wheels);BSC/3in1 ?   ?  ?Prior Function Prior Level of Function : Independent/Modified Independent ?  ?  ?  ?  ?  ?  ?  ?  ?  ? ? ?Hand Dominance  ?   ? ?  ?  Extremity/Trunk Assessment  ? Upper Extremity Assessment ?Upper Extremity Assessment: Overall WFL for tasks assessed ?  ? ?Lower Extremity Assessment ?Lower Extremity Assessment: RLE deficits/detail ?RLE Deficits / Details: Deficits consistent with post op pain and weakness. Pt with numbness in RLE and with R foot drop ?  ? ?Cervical / Trunk  Assessment ?Cervical / Trunk Assessment: Normal  ?Communication  ? Communication: No difficulties  ?Cognition Arousal/Alertness: Awake/alert ?Behavior During Therapy: [facility] for tasks assessed/performed ?Overall Cognitive Status: Within Functional Limits for tasks assessed ?  ?  ?  ?  ?  ?  ?  ?  ?  ?  ?  ?  ?  ?  ?  ?  ?  ?  ?  ? ?  ?General Comments   ? ?  ?Exercises    ? ?Assessment/Plan  ?  ?PT Assessment Patient needs continued PT services  ?PT Problem List Decreased strength;Decreased range of motion;Decreased activity tolerance;Decreased balance;Decreased mobility;Decreased coordination;Decreased knowledge of use of DME;Decreased knowledge of precautions;Impaired sensation ? ?   ?  ?PT Treatment Interventions DME instruction;Gait training;Functional mobility training;Stair training;Therapeutic exercise;Therapeutic activities;Patient/family education   ? ?PT Goals (Current goals can be found in the Care Plan section)  ?Acute Rehab PT Goals ?Patient Stated Goal: to go home ?PT Goal Formulation: With patient ?Time For Goal Achievement: 09/05/21 ?Potential to Achieve Goals: Good ? ?  ?Frequency 7X/week ?  ? ? ?Co-evaluation   ?  ?  ?  ?  ? ? ?  ?AM-PAC PT "6 Clicks" Mobility  ?Outcome Measure Help needed turning from your back to your side while in a flat bed without using bedrails?: A Little ?Help needed moving from lying on your back to sitting on the side of a flat bed without using bedrails?: A Little ?Help needed moving to and from a bed to a chair (including a wheelchair)?: A Little ?Help needed standing up from a chair using your arms (e.g., wheelchair or bedside chair)?: A Little ?Help needed to walk in hospital room?: A Lot ?Help needed climbing 3-5 steps with a railing? : A Lot ?6 Click Score: 16 ? ?  ?End of Session Equipment Utilized During Treatment: Gait belt;Right knee immobilizer ?Activity Tolerance: Patient tolerated treatment well ?Patient left: in chair;with call bell/phone within reach ?Nurse  Communication: Mobility status ?PT Visit Diagnosis: Other abnormalities of gait and mobility (R26.89);Pain ?Pain - Right/Left: Right ?Pain - part of body: Knee ?  ? ?Time: 7096-2836 ?PT Time Calculation (min) (ACUTE ONLY): 13 min ? ? ?Charges:   PT Evaluation ?$PT Eval Low Complexity: 1 Low ?  ?  ?   ? ? ?Farley Ly, PT, DPT  ?Acute Rehabilitation Services  ?Pager: [phone] ?Office: [phone] ? ? ?[place] Narciso Stout[place] ?08/22/2021, 4:34 PM ? ?

## 2021-08-22 NOTE — Progress Notes (Signed)
Patient ID: [name] [name]., adult   DOB: 01/17/1941, 81 y.o.   MRN: [id number] ?I did review the patient's postoperative films of his right knee.  There is NOT a fracture of his distal femur.  What the radiologist is referring to is actually bone plugs will be put into the distal femur to keep cement from going down the shaft of the femur.  This is also the radiolucent lines from where the intramedullary guide is placed in the femur for distal femoral cuts.  Again, this is not a fracture and the patient can weight-bear as tolerated. ?

## 2021-08-23 LAB — CBC
HCT: 36.8 % — ABNORMAL LOW (ref 39.0–52.0)
Hemoglobin: 12.1 g/dL — ABNORMAL LOW (ref 13.0–17.0)
MCH: 29.3 pg (ref 26.0–34.0)
MCHC: 32.9 g/dL (ref 30.0–36.0)
MCV: 89.1 fL (ref 80.0–100.0)
Platelets: 201 10*3/uL (ref 150–400)
RBC: 4.13 MIL/uL — ABNORMAL LOW (ref 4.22–5.81)
RDW: 13.6 % (ref 11.5–15.5)
WBC: 14.3 10*3/uL — ABNORMAL HIGH (ref 4.0–10.5)
nRBC: 0 % (ref 0.0–0.2)

## 2021-08-23 LAB — BASIC METABOLIC PANEL
Anion gap: 7 (ref 5–15)
BUN: 24 mg/dL — ABNORMAL HIGH (ref 8–23)
CO2: 22 mmol/L (ref 22–32)
Calcium: 8.4 mg/dL — ABNORMAL LOW (ref 8.9–10.3)
Chloride: 106 mmol/L (ref 98–111)
Creatinine, Ser: 1.22 mg/dL (ref 0.61–1.24)
GFR, Estimated: >60 mL/min (ref >60)
Glucose, Bld: 129 mg/dL — ABNORMAL HIGH (ref 70–99)
Potassium: 4.2 mmol/L (ref 3.5–5.1)
Sodium: 135 mmol/L (ref 135–145)

## 2021-08-23 NOTE — Care Management Obs Status (Signed)
MEDICARE OBSERVATION STATUS NOTIFICATION ? ? ?Patient Details  ?Name: [name] [name]. ?MRN: [id number] ?Date of Birth: September 08, 1940 ? ? ?Medicare Observation Status Notification Given:  Yes ? ? ? ?Epifanio Lesches, RN ?08/23/2021, 10:56 AM ?

## 2021-08-23 NOTE — Progress Notes (Signed)
Subjective: ?1 Day Post-Op Procedure(s) (LRB): ?RIGHT TOTAL KNEE ARTHROPLASTY (Right) ?Patient reports pain as moderate.   ? ?Objective: ?Vital signs in last 24 hours: ?Temp:  [97.7 ?F (36.5 ?C)-98.6 ?F (37 ?C)] 98.4 ?F (36.9 ?C) (04/04 1958) ?Pulse Rate:  [54-88] 88 (04/04 1958) ?Resp:  [8-18] 18 (04/04 1958) ?BP: (109-149)/(64-82) 149/80 (04/04 1958) ?SpO2:  [94 %-100 %] 96 % (04/04 1958) ? ?Intake/Output from previous day: ?04/04 0701 - 04/05 0700 ?In: 1199.8 [I.V.:1199.8] ?Out: 1450 [Urine:1400; Blood:50] ?Intake/Output this shift: ?No intake/output data recorded. ? ?Recent Labs  ?  08/23/21 ?0086  ?HGB 12.1*  ? ?Recent Labs  ?  08/23/21 ?7619  ?WBC 14.3*  ?RBC 4.13*  ?HCT 36.8*  ?PLT 201  ? ?Recent Labs  ?  08/23/21 ?5093  ?NA 135  ?K 4.2  ?CL 106  ?CO2 22  ?BUN 24*  ?CREATININE 1.22  ?GLUCOSE 129*  ?CALCIUM 8.4*  ? ?No results for input(s): LABPT, INR in the last 72 hours. ? ?Sensation intact distally ?Intact pulses distally ?Dorsiflexion/Plantar flexion intact ?Incision: dressing C/D/I ?No cellulitis present ?Compartment soft ? ? ?Assessment/Plan: ?1 Day Post-Op Procedure(s) (LRB): ?RIGHT TOTAL KNEE ARTHROPLASTY (Right) ?Up with therapy ? ? ? ? ? ?Kathryne Hitch ?08/23/2021, 7:51 AM ? ?

## 2021-08-23 NOTE — Progress Notes (Signed)
Physical Therapy Treatment ?Patient Details ?Name: [name] [name]. ?MRN: [id number] ?DOB: 19-Jun-1940 ?Today's Date: 08/23/2021 ? ? ?History of Present Illness Pt is a 81 y/o male s/p R TKA. PMH includes HTN, CKD. ? ?  ?PT Comments  ? ? Pt making good progress with R quads activation as he was able to ambulate without knee buckling this afternoon. He was also able to progress to ambulating up to ~40 ft with a RW and min guard-minA. He continues to display deficits in R anterior tibialis activation, thereby compensating by increasing hip flexion during swing to improve foot clearance. Educated pt and his wife on proper positioning of leg at rest and performing HEP on handout provided. Will continue to follow acutely. ?   ?Recommendations for follow up therapy are one component of a multi-disciplinary discharge planning process, led by the attending physician.  Recommendations may be updated based on patient status, additional functional criteria and insurance authorization. ? ?Follow Up Recommendations ? Follow physician's recommendations for discharge plan and follow up therapies ?  ?  ?Assistance Recommended at Discharge Intermittent Supervision/Assistance  ?Patient can return home with the following Help with stairs or ramp for entrance;Assistance with cooking/housework;Assist for transportation;A little help with walking and/or transfers;A little help with bathing/dressing/bathroom ?  ?Equipment Recommendations ? None recommended by PT  ?  ?Recommendations for Other Services   ? ? ?  ?Precautions / Restrictions Precautions ?Precautions: Knee ?Precaution Booklet Issued: No ?Precaution Comments: Verbally reviewed knee precautions; provided TKA HEP handout ?Restrictions ?Weight Bearing Restrictions: Yes ?RLE Weight Bearing: Weight bearing as tolerated  ?  ? ?Mobility ? Bed Mobility ?Overal bed mobility: Needs Assistance ?Bed Mobility: Sit to Supine ?  ?  ?  ?Sit to supine: Min guard ?  ?General bed mobility comments:  Cued pt to hook R leg with L ankle to assist it into bed, min guard for safety. ?  ? ?Transfers ?Overall transfer level: Needs assistance ?Equipment used: Rolling walker (2 wheels) ?Transfers: Sit to/from Stand ?Sit to Stand: Min guard ?  ?  ?  ?  ?  ?General transfer comment: Extra time and effort, cuing provided for L leg placement posteriorly and transitioning of hands to RW one at a time while transferring to stand from recliner, improved stability but still unstable, min guard for safety. ?  ? ?Ambulation/Gait ?Ambulation/Gait assistance: Min assist, Min guard ?Gait Distance (Feet): 40 Feet ?Assistive device: Rolling walker (2 wheels) ?Gait Pattern/deviations: Step-to pattern, Decreased stance time - right, Decreased dorsiflexion - right, Decreased weight shift to right, Knee flexed in stance - right, Antalgic, Trunk flexed ?Gait velocity: reduced ?Gait velocity interpretation: <1.31 ft/sec, indicative of household ambulator ?  ?General Gait Details: Pt with slow, step-to, antalgic gait pattern with R knee flexed in stance and difficulty advancing R leg and clearing R foot with swing. No knee buckling noted now. Cues provided to increase hip flexion during swing on R to improve foot clearance and to activate R quads during stance to improve knee stability during R stance phase. No LOB, min guard-minA for stability. ? ? ?Stairs ?  ?  ?  ?  ?  ? ? ?Wheelchair Mobility ?  ? ?Modified Rankin (Stroke Patients Only) ?  ? ? ?  ?Balance Overall balance assessment: Needs assistance ?Sitting-balance support: No upper extremity supported, Feet supported ?Sitting balance-Leahy Scale: Fair ?  ?  ?Standing balance support: Bilateral upper extremity supported ?Standing balance-Leahy Scale: Poor ?Standing balance comment: Reliant on BUE support and up to  minA ?  ?  ?  ?  ?  ?  ?  ?  ?  ?  ?  ?  ? ?  ?Cognition Arousal/Alertness: Awake/alert ?Behavior During Therapy: [facility] for tasks assessed/performed ?Overall Cognitive Status:  Within Functional Limits for tasks assessed ?  ?  ?  ?  ?  ?  ?  ?  ?  ?  ?  ?  ?  ?  ?  ?  ?  ?  ?  ? ?  ?Exercises Total Joint Exercises ?Ankle Circles/Pumps: AAROM, Right, 10 reps, Supine ?Quad Sets: AROM, Strengthening, Right, 10 reps, Supine ?Short Arc Quad: AAROM, Right, 10 reps, Supine ?Heel Slides: AAROM, Right, 10 reps, Supine ?Hip ABduction/ADduction: AAROM, Right, 10 reps, Supine ?Straight Leg Raises: AAROM, Right, 10 reps, Supine ?Long Arc Quad: AAROM, Right, 10 reps, Seated ? ?  ?General Comments General comments (skin integrity, edema, etc.): educated pt and wife on positioning of leg and HEP ?  ?  ? ?Pertinent Vitals/Pain Pain Assessment ?Pain Assessment: Faces ?Faces Pain Scale: Hurts even more ?Pain Location: R knee ?Pain Descriptors / Indicators: Grimacing, Guarding, Operative site guarding ?Pain Intervention(s): Limited activity within patient's tolerance, Monitored during session, Repositioned, Ice applied  ? ? ?Home Living   ?  ?  ?  ?  ?  ?  ?  ?  ?  ?   ?  ?Prior Function    ?  ?  ?   ? ?PT Goals (current goals can now be found in the care plan section) Acute Rehab PT Goals ?Patient Stated Goal: to get better ?PT Goal Formulation: With patient/family ?Time For Goal Achievement: 09/05/21 ?Potential to Achieve Goals: Good ?Progress towards PT goals: Progressing toward goals ? ?  ?Frequency ? ? ? 7X/week ? ? ? ?  ?PT Plan Current plan remains appropriate  ? ? ?Co-evaluation   ?  ?  ?  ?  ? ?  ?AM-PAC PT "6 Clicks" Mobility   ?Outcome Measure ? Help needed turning from your back to your side while in a flat bed without using bedrails?: A Little ?Help needed moving from lying on your back to sitting on the side of a flat bed without using bedrails?: A Little ?Help needed moving to and from a bed to a chair (including a wheelchair)?: A Little ?Help needed standing up from a chair using your arms (e.g., wheelchair or bedside chair)?: A Little ?Help needed to walk in hospital room?: A Little ?Help  needed climbing 3-5 steps with a railing? : A Lot ?6 Click Score: 17 ? ?  ?End of Session Equipment Utilized During Treatment: Gait belt ?Activity Tolerance: Patient tolerated treatment well ?Patient left: with call bell/phone within reach;in bed;with bed alarm set;with family/visitor present ?Nurse Communication: Mobility status ?PT Visit Diagnosis: Other abnormalities of gait and mobility (R26.89);Pain;Unsteadiness on feet (R26.81);Muscle weakness (generalized) (M62.81);Difficulty in walking, not elsewhere classified (R26.2) ?Pain - Right/Left: Right ?Pain - part of body: Knee ?  ? ? ?Time: 9528-4132 ?PT Time Calculation (min) (ACUTE ONLY): 21 min ? ?Charges:  $Gait Training: 8-22 mins        ?          ? ?Raymond Gurney, PT, DPT ?Acute Rehabilitation Services  ?Pager: [phone] ?Office: [phone] ? ? ? ?[name] [name] ?08/23/2021, 1:46 PM ? ?

## 2021-08-23 NOTE — Progress Notes (Signed)
Physical Therapy Treatment ?Patient Details ?Name: [name] [name]. ?MRN: [id number] ?DOB: Jun 25, 1940 ?Today's Date: 08/23/2021 ? ? ?History of Present Illness Pt is a 81 y/o male s/p R TKA. PMH includes HTN, CKD. ? ?  ?PT Comments  ? ? Began session focusing on performing and reviewing TKA HEP handout provided to pt. Pt with noted difficulty activating his R quads and anterior tibialis muscles, resulting in difficulty clearing his R foot in swing phase and noted R knee buckling with gait. Pt requiring min-modA to prevent LOB when ambualting ~20 ft with a RW today. Will continue to follow acutely. ?   ?Recommendations for follow up therapy are one component of a multi-disciplinary discharge planning process, led by the attending physician.  Recommendations may be updated based on patient status, additional functional criteria and insurance authorization. ? ?Follow Up Recommendations ? Follow physician's recommendations for discharge plan and follow up therapies ?  ?  ?Assistance Recommended at Discharge Intermittent Supervision/Assistance  ?Patient can return home with the following Help with stairs or ramp for entrance;Assistance with cooking/housework;Assist for transportation;A little help with walking and/or transfers;A little help with bathing/dressing/bathroom ?  ?Equipment Recommendations ? None recommended by PT  ?  ?Recommendations for Other Services   ? ? ?  ?Precautions / Restrictions Precautions ?Precautions: Knee ?Precaution Booklet Issued: No ?Precaution Comments: Verbally reviewed knee precautions; provided TKA HEP handout ?Required Braces or Orthoses: Knee Immobilizer - Right ?Restrictions ?Weight Bearing Restrictions: Yes ?RLE Weight Bearing: Weight bearing as tolerated  ?  ? ?Mobility ? Bed Mobility ?  ?  ?  ?  ?  ?  ?  ?General bed mobility comments: Pt up in chair upon arrival ?  ? ?Transfers ?Overall transfer level: Needs assistance ?Equipment used: Rolling walker (2 wheels) ?Transfers: Sit to/from  Stand, Bed to chair/wheelchair/BSC ?Sit to Stand: Min guard ?  ?  ?  ?  ?  ?General transfer comment: Extra time and effort with instability noted coming to stand from recliner to RW, oprimarily with transitioning hands between surfaces. Min guard for safety. ?  ? ?Ambulation/Gait ?Ambulation/Gait assistance: Min assist, Mod assist ?Gait Distance (Feet): 20 Feet ?Assistive device: Rolling walker (2 wheels) ?Gait Pattern/deviations: Step-to pattern, Decreased stance time - right, Decreased dorsiflexion - right, Decreased weight shift to right, Knee flexed in stance - right, Knees buckling, Antalgic, Trunk flexed ?Gait velocity: reduced ?Gait velocity interpretation: <1.31 ft/sec, indicative of household ambulator ?  ?General Gait Details: Pt with slow, step-to, antalgic gait pattern with R knee flexed in stance and difficulty advancing R leg and clearing R foot with swing. R knee buckling noted, in which pt needed modA intermittently when it would to regain balance. Otherwise, pt needing minA for stability, cuing him to push up through R and extend R knee with R stance. ? ? ?Stairs ?  ?  ?  ?  ?  ? ? ?Wheelchair Mobility ?  ? ?Modified Rankin (Stroke Patients Only) ?  ? ? ?  ?Balance Overall balance assessment: Needs assistance ?Sitting-balance support: No upper extremity supported, Feet supported ?Sitting balance-Leahy Scale: Fair ?  ?  ?Standing balance support: Bilateral upper extremity supported ?Standing balance-Leahy Scale: Poor ?Standing balance comment: Reliant on BUE support and min-modA for gait ?  ?  ?  ?  ?  ?  ?  ?  ?  ?  ?  ?  ? ?  ?Cognition Arousal/Alertness: Awake/alert ?Behavior During Therapy: [facility] for tasks assessed/performed ?Overall Cognitive Status: Within Functional Limits for  tasks assessed ?  ?  ?  ?  ?  ?  ?  ?  ?  ?  ?  ?  ?  ?  ?  ?  ?  ?  ?  ? ?  ?Exercises Total Joint Exercises ?Ankle Circles/Pumps: AAROM, Right, 10 reps, Supine ?Quad Sets: AROM, Strengthening, Right, 10 reps,  Supine ?Short Arc Quad: AAROM, Right, 10 reps, Supine ?Heel Slides: AAROM, Right, 10 reps, Supine ?Hip ABduction/ADduction: AAROM, Right, 10 reps, Supine ?Straight Leg Raises: AAROM, Right, 10 reps, Supine ? ?  ?General Comments General comments (skin integrity, edema, etc.): Educated pt on TKA handout provided ?  ?  ? ?Pertinent Vitals/Pain Pain Assessment ?Pain Assessment: Faces ?Faces Pain Scale: Hurts even more ?Pain Location: R knee ?Pain Descriptors / Indicators: Grimacing, Guarding, Operative site guarding ?Pain Intervention(s): Limited activity within patient's tolerance, Monitored during session, Repositioned  ? ? ?Home Living Family/patient expects to be discharged to:: Private residence ?Living Arrangements: Spouse/significant other ?Available Help at Discharge: Family ?Type of Home: House ?Home Access: Stairs to enter ?Entrance Stairs-Rails: Right ?Entrance Stairs-Number of Steps: 5 ?  ?Home Layout: One level ?Home Equipment: Rolling Walker (2 wheels);BSC/3in1;Shower seat ?   ?  ?Prior Function    ?  ?  ?   ? ?PT Goals (current goals can now be found in the care plan section) Acute Rehab PT Goals ?Patient Stated Goal: to have his wife present for therapy session ?PT Goal Formulation: With patient ?Time For Goal Achievement: 09/05/21 ?Potential to Achieve Goals: Good ?Progress towards PT goals: Progressing toward goals ? ?  ?Frequency ? ? ? 7X/week ? ? ? ?  ?PT Plan Current plan remains appropriate  ? ? ?Co-evaluation   ?  ?  ?  ?  ? ?  ?AM-PAC PT "6 Clicks" Mobility   ?Outcome Measure ? Help needed turning from your back to your side while in a flat bed without using bedrails?: A Little ?Help needed moving from lying on your back to sitting on the side of a flat bed without using bedrails?: A Little ?Help needed moving to and from a bed to a chair (including a wheelchair)?: A Little ?Help needed standing up from a chair using your arms (e.g., wheelchair or bedside chair)?: A Little ?Help needed to walk  in hospital room?: A Lot ?Help needed climbing 3-5 steps with a railing? : A Lot ?6 Click Score: 16 ? ?  ?End of Session Equipment Utilized During Treatment: Gait belt ?Activity Tolerance: Patient tolerated treatment well ?Patient left: in chair;with call bell/phone within reach ?  ?PT Visit Diagnosis: Other abnormalities of gait and mobility (R26.89);Pain;Unsteadiness on feet (R26.81);Muscle weakness (generalized) (M62.81);Difficulty in walking, not elsewhere classified (R26.2) ?Pain - Right/Left: Right ?Pain - part of body: Knee ?  ? ? ?Time: 9937-1696 ?PT Time Calculation (min) (ACUTE ONLY): 21 min ? ?Charges:  $Therapeutic Exercise: 8-22 mins          ?          ? ?Raymond Gurney, PT, DPT ?Acute Rehabilitation Services  ?Pager: [phone] ?Office: [phone] ? ? ? ?Henrene Dodge Pettis ?08/23/2021, 10:13 AM ? ?

## 2021-08-23 NOTE — Care Plan (Signed)
Ortho Bundle Case Management Note ? ?Patient Details  ?Name: [name] [name]. ?MRN: [id number] ?Date of Birth: [birth year] ? ?[facility] RNCM call to patient prior to his Right total knee arthroplasty with Dr. Magnus Ivan on 08/22/21. He is an Ortho bundle patient through [facility] and is agreeable to case management. He lives with his spouse, who will be assisting at home after discharge. He has a RW and 3in1/BSC. No DME needed. Anticipate HHPT will be needed after discharge. Referral made to [facility] after choice provided. Patient would like to go to [facility] in [place] when appropriate. CM will assist with referral and scheduling. Reviewed all post op care instructions. Will continue to follow for needs.               ? ? ? ?DME Arranged:   (Patient has a RW and 3in1/BSC already in home; no DME needed) ?DME Agency:  [facility] ? ?HH Arranged:  PT ?HH Agency:  [facility] ? ?Additional Comments: ?Please contact me with any questions of if this plan should need to change. ? ?Ralph Dowdy, RN, BSN, [employer]  [phone] ?08/23/2021, 9:25 AM ?  ?

## 2021-08-23 NOTE — Plan of Care (Signed)
  Problem: Nutrition: Goal: Adequate nutrition will be maintained Outcome: Progressing   Problem: Coping: Goal: Level of anxiety will decrease Outcome: Progressing   Problem: Pain Managment: Goal: General experience of comfort will improve Outcome: Progressing   Problem: Skin Integrity: Goal: Risk for impaired skin integrity will decrease Outcome: Progressing   

## 2021-08-23 NOTE — Discharge Instructions (Signed)

## 2021-08-23 NOTE — Evaluation (Signed)
Occupational Therapy Evaluation ?Patient Details ?Name: [name] [name]. ?MRN: [id number] ?DOB: Jan 27, 1941 ?Today's Date: 08/23/2021 ? ? ?History of Present Illness Pt is a 81 y/o male s/p R TKA. PMH includes HTN, CKD.  ? ?Clinical Impression ?  ?Pt lives with spouse, independent at baseline with ADLs and functional mobility. Pt currently set up - mod A for ADLs, increased assistance needed for LB ADLs. Pt able to complete in room ambulation and toilet transfer with min guard, however unable to bear much weight through RLE. Educated pt on use of reacher for dressing, which he has at home, and safety for showering. Pt verbalizes understanding. Pt presenting with impairments listed below, will follow acutely.  ?   ? ?Recommendations for follow up therapy are one component of a multi-disciplinary discharge planning process, led by the attending physician.  Recommendations may be updated based on patient status, additional functional criteria and insurance authorization.  ? ?Follow Up Recommendations ? Follow physician's recommendations for discharge plan and follow up therapies  ?  ?Assistance Recommended at Discharge Set up Supervision/Assistance  ?Patient can return home with the following A little help with walking and/or transfers;A little help with bathing/dressing/bathroom;Assistance with cooking/housework;Help with stairs or ramp for entrance;Assist for transportation ? ?  ?Functional Status Assessment ? Patient has had a recent decline in their functional status and demonstrates the ability to make significant improvements in function in a reasonable and predictable amount of time.  ?Equipment Recommendations ? None recommended by OT;Other (comment) (pt has all needed DME)  ?  ?Recommendations for Other Services   ? ? ?  ?Precautions / Restrictions Precautions ?Precautions: Knee ?Precaution Booklet Issued: No ?Required Braces or Orthoses: Knee Immobilizer - Right ?Restrictions ?Weight Bearing Restrictions: Yes ?RLE  Weight Bearing: Weight bearing as tolerated  ? ?  ? ?Mobility Bed Mobility ?Overal bed mobility: Needs Assistance ?Bed Mobility: Supine to Sit ?  ?  ?Supine to sit: Min assist ?  ?  ?General bed mobility comments: to bring RLE to EOB ?  ? ?Transfers ?Overall transfer level: Needs assistance ?Equipment used: Rolling walker (2 wheels) ?Transfers: Sit to/from Stand, Bed to chair/wheelchair/BSC ?Sit to Stand: Min assist ?  ?  ?  ?  ?  ?General transfer comment: benefits from elevated surface ?  ? ?  ?Balance Overall balance assessment: Needs assistance ?Sitting-balance support: No upper extremity supported, Feet supported ?Sitting balance-Leahy Scale: Good ?  ?  ?Standing balance support: Bilateral upper extremity supported ?Standing balance-Leahy Scale: Poor ?Standing balance comment: Reliant on external support in static standing and ambulatino ?  ?  ?  ?  ?  ?  ?  ?  ?  ?  ?  ?   ? ?ADL either performed or assessed with clinical judgement  ? ?ADL Overall ADL's : Needs assistance/impaired ?Eating/Feeding: Set up;Sitting ?  ?Grooming: Set up;Sitting ?  ?Upper Body Bathing: Set up;Sitting ?  ?Lower Body Bathing: Minimal assistance;Sitting/lateral leans ?  ?Upper Body Dressing : Set up;Sitting ?  ?Lower Body Dressing: Moderate assistance;Sitting/lateral leans;Sit to/from stand ?Lower Body Dressing Details (indicate cue type and reason): to don socks ?Toilet Transfer: Min guard;Comfort height toilet;Ambulation;Rolling walker (2 wheels) ?  ?Toileting- Clothing Manipulation and Hygiene: Supervision/safety;Sitting/lateral lean ?Toileting - Clothing Manipulation Details (indicate cue type and reason): for clothing mgmt/pericare ?  ?  ?Functional mobility during ADLs: Min guard;Rolling walker (2 wheels);Cueing for sequencing;Cueing for safety ?   ? ? ? ?Vision   ?Vision Assessment?: No apparent visual deficits  ?   ?  Perception   ?  ?Praxis   ?  ? ?Pertinent Vitals/Pain Pain Assessment ?Pain Assessment: Faces ?Pain Score: 4   ?Faces Pain Scale: Hurts little more ?Pain Location: R knee ?Pain Descriptors / Indicators: Grimacing, Guarding, Operative site guarding ?Pain Intervention(s): Limited activity within patient's tolerance, Monitored during session, Repositioned, Patient requesting pain meds-RN notified, Ice applied  ? ? ? ?Hand Dominance   ?  ?Extremity/Trunk Assessment Upper Extremity Assessment ?Upper Extremity Assessment: Overall WFL for tasks assessed ?  ?Lower Extremity Assessment ?Lower Extremity Assessment: Defer to PT evaluation ?  ?Cervical / Trunk Assessment ?Cervical / Trunk Assessment: Normal ?  ?Communication Communication ?Communication: HOH ?  ?Cognition Arousal/Alertness: Awake/alert ?Behavior During Therapy: [facility] for tasks assessed/performed ?Overall Cognitive Status: Within Functional Limits for tasks assessed ?  ?  ?  ?  ?  ?  ?  ?  ?  ?  ?  ?  ?  ?  ?  ?  ?  ?  ?  ?General Comments  VSS on RA ? ?  ?Exercises   ?  ?Shoulder Instructions    ? ? ?Home Living Family/patient expects to be discharged to:: Private residence ?Living Arrangements: Spouse/significant other ?Available Help at Discharge: Family ?Type of Home: House ?Home Access: Stairs to enter ?Entrance Stairs-Number of Steps: 5 ?Entrance Stairs-Rails: Right ?Home Layout: One level ?  ?  ?Bathroom Shower/Tub: Walk-in shower ?  ?Bathroom Toilet: Standard ?  ?  ?Home Equipment: Rolling Walker (2 wheels);BSC/3in1;Shower seat ?  ?  ?  ? ?  ?Prior Functioning/Environment Prior Level of Function : Independent/Modified Independent ?  ?  ?  ?  ?  ?  ?Mobility Comments: no AD use ?ADLs Comments: does IADLs ?  ? ?  ?  ?OT Problem List: Decreased strength;Decreased activity tolerance;Decreased range of motion;Impaired balance (sitting and/or standing);Pain ?  ?   ?OT Treatment/Interventions: Self-care/ADL training;Therapeutic exercise;DME and/or AE instruction;Therapeutic activities;Patient/family education;Balance training  ?  ?OT Goals(Current goals can be found in  the care plan section) Acute Rehab OT Goals ?Patient Stated Goal: to go home ?OT Goal Formulation: With patient ?Time For Goal Achievement: 09/06/21 ?Potential to Achieve Goals: Good ?ADL Goals ?Pt Will Perform Upper Body Dressing: with modified independence;sitting ?Pt Will Perform Lower Body Dressing: with min assist;sit to/from stand;sitting/lateral leans ?Pt Will Transfer to Toilet: with supervision;ambulating;regular height toilet ?Pt Will Perform Tub/Shower Transfer: with min assist;rolling walker;ambulating;shower seat;Shower transfer  ?OT Frequency: Min 3X/week ?  ? ?Co-evaluation   ?  ?  ?  ?  ? ?  ?AM-PAC OT "6 Clicks" Daily Activity     ?Outcome Measure Help from another person eating meals?: None ?Help from another person taking care of personal grooming?: None ?Help from another person toileting, which includes using toliet, bedpan, or urinal?: A Little ?Help from another person bathing (including washing, rinsing, drying)?: A Little ?Help from another person to put on and taking off regular upper body clothing?: None ?Help from another person to put on and taking off regular lower body clothing?: A Lot ?6 Click Score: 20 ?  ?End of Session Equipment Utilized During Treatment: Gait belt;Rolling walker (2 wheels) ?Nurse Communication: Mobility status ? ?Activity Tolerance: Patient tolerated treatment well ?Patient left: in chair;with call bell/phone within reach ? ?OT Visit Diagnosis: Unsteadiness on feet (R26.81);Other abnormalities of gait and mobility (R26.89);Muscle weakness (generalized) (M62.81)  ?              ?Time: 6010-9323 ?OT Time Calculation (min): 23 min ?Charges:  OT General Charges ?$OT Visit: 1 Visit ?OT Evaluation ?$OT Eval Low Complexity: 1 Low ?OT Treatments ?$Self Care/Home Management : 8-22 mins ? ?Alfonzo BeersNatalie Jebadiah Imperato, OTD, OTR/L ?Acute Rehab ?[phone] ? ?Mayer MaskerNatalie M Alaylah Heatherington ?08/23/2021, 10:03 AM ?

## 2021-08-23 NOTE — Progress Notes (Signed)
Patient ID: [name] [name]., adult   DOB: 11-11-1940, 81 y.o.   MRN: [id number] ?The patient had slow progress with therapy this morning.  He is in not much pain at all but he feels like the block is still working.  However he is showing weakness on my exam of the right lower extremity.  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  With that being said, he will benefit from continued therapy today and I need to keep him at least until tomorrow.  He may end up needing an AFO on that right lower extremity for the short-term. ?

## 2021-08-23 NOTE — Anesthesia Postprocedure Evaluation (Signed)
Anesthesia Post Note ? ?Patient: [name] [name]. ? ?Procedure(s) Performed: RIGHT TOTAL KNEE ARTHROPLASTY (Right: Knee) ? ?  ? ?Patient location during evaluation: PACU ?Anesthesia Type: MAC, Spinal and Regional ?Level of consciousness: oriented and awake and alert ?Pain management: pain level controlled ?Vital Signs Assessment: post-procedure vital signs reviewed and stable ?Respiratory status: spontaneous breathing, respiratory function stable and patient connected to nasal cannula oxygen ?Cardiovascular status: blood pressure returned to baseline and stable ?Postop Assessment: no headache, no backache and no apparent nausea or vomiting ?Anesthetic complications: no ? ? ?No notable events documented. ? ?Last Vitals:  ?Vitals:  ? 08/22/21 1651 08/22/21 1958  ?BP: (!) 149/79 (!) 149/80  ?Pulse: 85 88  ?Resp: 17 18  ?Temp: 36.5 ?C 36.9 ?C  ?SpO2: 97% 96%  ?  ?Last Pain:  ?Vitals:  ? 08/22/21 2223  ?TempSrc:   ?PainSc: Asleep  ? ? ?  ?  ?  ?  ?  ?  ? ?[place] ? ? ? ? ?

## 2021-08-24 ENCOUNTER — Encounter (HOSPITAL_COMMUNITY): Payer: Self-pay | Admitting: Orthopaedic Surgery

## 2021-08-24 DIAGNOSIS — G8929 Other chronic pain: Secondary | ICD-10-CM | POA: Diagnosis present

## 2021-08-24 DIAGNOSIS — Z79899 Other long term (current) drug therapy: Secondary | ICD-10-CM | POA: Diagnosis not present

## 2021-08-24 DIAGNOSIS — Z8249 Family history of ischemic heart disease and other diseases of the circulatory system: Secondary | ICD-10-CM | POA: Diagnosis not present

## 2021-08-24 DIAGNOSIS — M1711 Unilateral primary osteoarthritis, right knee: Secondary | ICD-10-CM | POA: Diagnosis present

## 2021-08-24 DIAGNOSIS — M21371 Foot drop, right foot: Secondary | ICD-10-CM | POA: Diagnosis not present

## 2021-08-24 DIAGNOSIS — Z96651 Presence of right artificial knee joint: Secondary | ICD-10-CM

## 2021-08-24 DIAGNOSIS — Z8049 Family history of malignant neoplasm of other genital organs: Secondary | ICD-10-CM | POA: Diagnosis not present

## 2021-08-24 DIAGNOSIS — Z833 Family history of diabetes mellitus: Secondary | ICD-10-CM | POA: Diagnosis not present

## 2021-08-24 DIAGNOSIS — E785 Hyperlipidemia, unspecified: Secondary | ICD-10-CM | POA: Diagnosis present

## 2021-08-24 DIAGNOSIS — Z87891 Personal history of nicotine dependence: Secondary | ICD-10-CM | POA: Diagnosis not present

## 2021-08-24 DIAGNOSIS — I10 Essential (primary) hypertension: Secondary | ICD-10-CM | POA: Diagnosis present

## 2021-08-24 DIAGNOSIS — M545 Low back pain, unspecified: Secondary | ICD-10-CM | POA: Diagnosis present

## 2021-08-24 DIAGNOSIS — N400 Enlarged prostate without lower urinary tract symptoms: Secondary | ICD-10-CM | POA: Diagnosis present

## 2021-08-24 DIAGNOSIS — Z87442 Personal history of urinary calculi: Secondary | ICD-10-CM | POA: Diagnosis not present

## 2021-08-24 MED ORDER — BUPIVACAINE IN DEXTROSE 0.75-8.25 % IT SOLN
INTRATHECAL | Status: DC | PRN
  Administered 2021-08-22: 1.8 mL via INTRATHECAL

## 2021-08-24 MED ORDER — KETOROLAC TROMETHAMINE 15 MG/ML IJ SOLN
INTRAMUSCULAR | Status: AC
  Administered 2021-08-25: 7.5 mg via INTRAVENOUS
  Filled 2021-08-24: qty 1

## 2021-08-24 MED ORDER — KETOROLAC TROMETHAMINE 15 MG/ML IJ SOLN
7.5000 mg | Freq: Four times a day (QID) | INTRAMUSCULAR | Status: AC
Start: 2021-08-24 — End: 2021-08-25
  Administered 2021-08-24 (×2): 7.5 mg via INTRAVENOUS
  Filled 2021-08-24 (×4): qty 1

## 2021-08-24 NOTE — Anesthesia Procedure Notes (Signed)
Spinal ? ?Patient location during procedure: OR ?Start time: 08/22/2021 9:40 AM ?End time: 08/22/2021 9:43 AM ?Reason for block: surgical anesthesia ?Staffing ?Performed: anesthesiologist  ?Anesthesiologist: Achille Rich, MD ?Preanesthetic Checklist ?Completed: patient identified, IV checked, risks and benefits discussed, surgical consent, monitors and equipment checked, pre-op evaluation and timeout performed ?Spinal Block ?Patient position: sitting ?Prep: DuraPrep ?Patient monitoring: cardiac monitor, continuous pulse ox and blood pressure ?Approach: midline ?Location: L3-4 ?Injection technique: single-shot ?Needle ?Needle type: Pencan  ?Needle gauge: 24 G ?Needle length: 9 cm ?Assessment ?Sensory level: T10 ?Events: CSF return ?Additional Notes ?Functioning IV was confirmed and monitors were applied. Sterile prep and drape, including hand hygiene and sterile gloves were used. The patient was positioned and the spine was prepped. The skin was anesthetized with lidocaine.  Free flow of clear CSF was obtained prior to injecting local anesthetic into the CSF.  The spinal needle aspirated freely following injection.  The needle was carefully withdrawn.  The patient tolerated the procedure well.  ? ? ? ?

## 2021-08-24 NOTE — Plan of Care (Signed)
°  Problem: Education: °Goal: Knowledge of the prescribed therapeutic regimen will improve °Outcome: Progressing °  °Problem: Activity: °Goal: Ability to avoid complications of mobility impairment will improve °Outcome: Progressing °  °Problem: Clinical Measurements: °Goal: Postoperative complications will be avoided or minimized °Outcome: Progressing °  °

## 2021-08-24 NOTE — Addendum Note (Signed)
Addendum  created 08/24/21 2111 by Achille Rich, MD  ? Child order released for a procedure order, Clinical Note Signed, Intraprocedure Blocks edited, Intraprocedure Meds edited, SmartForm saved  ?  ?

## 2021-08-24 NOTE — Progress Notes (Signed)
PT Cancellation Note ? ?Patient Details ?Name: Fields Oros. ?MRN: [id number] ?DOB: 1940/08/08 ? ? ?Cancelled Treatment:    Reason Eval/Treat Not Completed: Patient declined, no reason specified, pt declining mobility with c/o fatigue and pain despite IV pain premedication, requesting this PTA return in afternoon. Will re-attempt in PM to continue with PT POC. ? ? ? ?Marlana Salvage Edahi Kroening ?08/24/2021, 9:44 AM ?

## 2021-08-24 NOTE — Progress Notes (Signed)
Physical Therapy Treatment ?Patient Details ?Name: [name] [name]. ?MRN: [id number] ?DOB: 05/23/1940 ?Today's Date: 08/24/2021 ? ? ?History of Present Illness Pt is a 81 y/o male s/p R TKA. PMH includes HTN, CKD. ? ?  ?PT Comments  ? ? Pt making slow progress towards goals this session, pt requiring mod-max assist to come to sitting EOB with assist needed to elevate trunk and to maintain upright sitting, secondary to increased pain. Once seated EOB and RLE propped on trash can, pt able to maintain at supervision level. Pt needing min assist to stand at EOB to void and min guard to transfer to recliner with seated recovery between and increased time to complete. Pt continues to have poor tibialis anterior activation and little to no active DF. Plan to continue to progress bed mobility, transfers and gait in PM session. Pt continues to benefit from skilled PT services to progress toward functional mobility goals.  ? ?  ?Recommendations for follow up therapy are one component of a multi-disciplinary discharge planning process, led by the attending physician.  Recommendations may be updated based on patient status, additional functional criteria and insurance authorization. ? ?Follow Up Recommendations ? Follow physician's recommendations for discharge plan and follow up therapies ?  ?  ?Assistance Recommended at Discharge Intermittent Supervision/Assistance  ?Patient can return home with the following Help with stairs or ramp for entrance;Assistance with cooking/housework;Assist for transportation;A little help with walking and/or transfers;A little help with bathing/dressing/bathroom ?  ?Equipment Recommendations ? None recommended by PT  ?  ?Recommendations for Other Services   ? ? ?  ?Precautions / Restrictions Precautions ?Precautions: Knee ?Precaution Booklet Issued: No ?Precaution Comments: Verbally reviewed knee precautions; provided TKA HEP handout ?Restrictions ?Weight Bearing Restrictions: Yes ?RLE Weight  Bearing: Weight bearing as tolerated  ?  ? ?Mobility ? Bed Mobility ?Overal bed mobility: Needs Assistance ?Bed Mobility: Supine to Sit ?  ?  ?  ?Sit to supine: Mod assist, Max assist ?  ?General bed mobility comments: mod-max assist to elevate trunk and maintain upright sitting secondary to pain ?  ? ?Transfers ?Overall transfer level: Needs assistance ?Equipment used: Rolling walker (2 wheels) ?Transfers: Sit to/from Stand, Bed to chair/wheelchair/BSC ?Sit to Stand: Min guard, Min assist ?Stand pivot transfers: Min guard ?  ?  ?  ?  ?General transfer comment: Extra time and effort, cuing provided for L leg placement posteriorly and transitioning of hands to RW one at a time, unstable, min guard for safety. ?  ? ?Ambulation/Gait ?  ?  ?  ?  ?Gait velocity: pt declingin secondary to pain ?  ?  ?  ? ? ?Stairs ?  ?  ?  ?  ?  ? ? ?Wheelchair Mobility ?  ? ?Modified Rankin (Stroke Patients Only) ?  ? ? ?  ?Balance Overall balance assessment: Needs assistance ?Sitting-balance support: No upper extremity supported, Feet supported ?Sitting balance-Leahy Scale: Fair ?  ?  ?Standing balance support: Bilateral upper extremity supported ?Standing balance-Leahy Scale: Poor ?Standing balance comment: Reliant on BUE support and up to minA ?  ?  ?  ?  ?  ?  ?  ?  ?  ?  ?  ?  ? ?  ?Cognition Arousal/Alertness: Awake/alert ?Behavior During Therapy: [facility] for tasks assessed/performed ?Overall Cognitive Status: Within Functional Limits for tasks assessed ?  ?  ?  ?  ?  ?  ?  ?  ?  ?  ?  ?  ?  ?  ?  ?  ?  ?  ?  ? ?  ?  Exercises Total Joint Exercises ?Ankle Circles/Pumps: AROM, AAROM, Right, 20 reps, Supine ? ?  ?General Comments   ?  ?  ? ?Pertinent Vitals/Pain Pain Assessment ?Pain Assessment: Faces ?Faces Pain Scale: Hurts even more ?Pain Location: R knee ?Pain Descriptors / Indicators: Grimacing, Guarding, Operative site guarding ?Pain Intervention(s): Limited activity within patient's tolerance, Monitored during session, Premedicated  before session, Repositioned  ? ? ?Home Living   ?  ?  ?  ?  ?  ?  ?  ?  ?  ?   ?  ?Prior Function    ?  ?  ?   ? ?PT Goals (current goals can now be found in the care plan section) Acute Rehab PT Goals ?Patient Stated Goal: to get better ?PT Goal Formulation: With patient/family ?Time For Goal Achievement: 09/05/21 ? ?  ?Frequency ? ? ? 7X/week ? ? ? ?  ?PT Plan Current plan remains appropriate  ? ? ?Co-evaluation   ?  ?  ?  ?  ? ?  ?AM-PAC PT "6 Clicks" Mobility   ?Outcome Measure ? Help needed turning from your back to your side while in a flat bed without using bedrails?: A Little ?Help needed moving from lying on your back to sitting on the side of a flat bed without using bedrails?: A Lot ?Help needed moving to and from a bed to a chair (including a wheelchair)?: A Little ?Help needed standing up from a chair using your arms (e.g., wheelchair or bedside chair)?: A Little ?Help needed to walk in hospital room?: A Little ?Help needed climbing 3-5 steps with a railing? : A Lot ?6 Click Score: 16 ? ?  ?End of Session Equipment Utilized During Treatment: Gait belt ?Activity Tolerance: Patient tolerated treatment well;Patient limited by pain ?Patient left: in chair;with call bell/phone within reach;with family/visitor present ?Nurse Communication: Mobility status ?PT Visit Diagnosis: Other abnormalities of gait and mobility (R26.89);Pain;Unsteadiness on feet (R26.81);Muscle weakness (generalized) (M62.81);Difficulty in walking, not elsewhere classified (R26.2) ?Pain - Right/Left: Right ?Pain - part of body: Knee ?  ? ? ?Time: [id number] ?PT Time Calculation (min) (ACUTE ONLY): 27 min ? ?Charges:  $Gait Training: 8-22 mins ?$Therapeutic Activity: 8-22 mins          ?          ? ?Audry Riles. PTA ?Acute Rehabilitation Services ?Office: [phone] ? ? ? ?Betsey Holiday Nuriya Stuck ?08/24/2021, 12:06 PM ? ?

## 2021-08-24 NOTE — Progress Notes (Signed)
Subjective: ?2 Days Post-Op Procedure(s) (LRB): ?RIGHT TOTAL KNEE ARTHROPLASTY (Right) ?Patient reports pain as moderate to sever right leg. Complaining unable to move foot .  ? ?Objective: ?Vital signs in last 24 hours: ?Temp:  [98.4 ?F (36.9 ?C)-99.1 ?F (37.3 ?C)] 98.4 ?F (36.9 ?C) (04/06 4270) ?Pulse Rate:  [73-74] 73 (04/06 0716) ?Resp:  [15-18] 17 (04/06 0716) ?BP: (123-165)/(58-71) 165/68 (04/06 0716) ?SpO2:  [97 %-100 %] 97 % (04/06 0716) ? ?Intake/Output from previous day: ?04/05 0701 - 04/06 0700 ?In: -  ?Out: 1600 [Urine:1600] ?Intake/Output this shift: ?No intake/output data recorded. ? ?Recent Labs  ?  08/23/21 ?6237  ?HGB 12.1*  ? ?Recent Labs  ?  08/23/21 ?6283  ?WBC 14.3*  ?RBC 4.13*  ?HCT 36.8*  ?PLT 201  ? ?Recent Labs  ?  08/23/21 ?1517  ?NA 135  ?K 4.2  ?CL 106  ?CO2 22  ?BUN 24*  ?CREATININE 1.22  ?GLUCOSE 129*  ?CALCIUM 8.4*  ? ?No results for input(s): LABPT, INR in the last 72 hours. ? ?Incision: scant drainage ?Compartment soft ?Unable to dorsiflex right foot. Subjective decreased sensation dorsal right foot.  ? ?Assessment/Plan: ?2 Days Post-Op Procedure(s) (LRB): ?RIGHT TOTAL KNEE ARTHROPLASTY (Right) ?Up with therapy ?Order AFO for foot drop  ?Will require at least an additional night stay to work on pain control and gait/balance.  ? ? ? ? ?[name] [name] ?08/24/2021, 9:30 AM ? ?

## 2021-08-24 NOTE — Plan of Care (Signed)

## 2021-08-24 NOTE — Progress Notes (Signed)
Physical Therapy Treatment ?Patient Details ?Name: [name] [name]. ?MRN: [id number] ?DOB: [birth year] ?Today's Date: 08/24/2021 ? ? ?History of Present Illness Pt is a 81 y/o male s/p R TKA. PMH includes HTN, CKD. ? ?  ?PT Comments  ? ? Pt received in chair and agreeable to session with focus on progression of ambulation with soft "AFO" to DF foot. Pt with grossly min guard for ambulation with mod assist for heavy verbal and tactile cueing for sequencing, weight acceptance on R, keeping toes forward, heel strike , upright posture and RW management. Pt able to accept more weight on RLE without knee buckling with use of KI on right. Pt continues to fatigue easily with ambulation only tolerated in room this session. Pt continues to benefit from skilled PT services to progress toward functional mobility goals.  ?  ?Recommendations for follow up therapy are one component of a multi-disciplinary discharge planning process, led by the attending physician.  Recommendations may be updated based on patient status, additional functional criteria and insurance authorization. ? ?Follow Up Recommendations ? Follow physician's recommendations for discharge plan and follow up therapies ?  ?  ?Assistance Recommended at Discharge Intermittent Supervision/Assistance  ?Patient can return home with the following Help with stairs or ramp for entrance;Assistance with cooking/housework;Assist for transportation;A little help with walking and/or transfers;A little help with bathing/dressing/bathroom ?  ?Equipment Recommendations ? None recommended by PT  ?  ?Recommendations for Other Services   ? ? ?  ?Precautions / Restrictions Precautions ?Precautions: Knee ?Precaution Booklet Issued: No ?Precaution Comments: Verbally reviewed knee precautions; provided TKA HEP handout ?Restrictions ?Weight Bearing Restrictions: Yes ?RLE Weight Bearing: Weight bearing as tolerated  ?  ? ?Mobility ? Bed Mobility ?Overal bed mobility: Needs Assistance ?Bed  Mobility: Supine to Sit ?  ?  ?  ?Sit to supine: Mod assist, Max assist ?  ?General bed mobility comments: OOB in recliner upon arrival ?  ? ?Transfers ?Overall transfer level: Needs assistance ?Equipment used: Rolling walker (2 wheels) ?Transfers: Sit to/from Stand, Bed to chair/wheelchair/BSC ?Sit to Stand: Min guard, Min assist ?Stand pivot transfers: Min guard ?  ?  ?  ?  ?General transfer comment: min assist to power up from recliner and min guard while up ?  ? ?Ambulation/Gait ?Ambulation/Gait assistance: Min guard, Mod assist (assist for cues) ?Gait Distance (Feet): 40 Feet ?Assistive device: Rolling walker (2 wheels) ?Gait Pattern/deviations: Step-to pattern, Decreased stance time - right, Decreased dorsiflexion - right, Decreased weight shift to right, Knee flexed in stance - right, Antalgic, Trunk flexed ?Gait velocity: decr ?  ?  ?General Gait Details: Pt with slow, step-to, antalgic gait with difficulty advancing R leg and clearing R foot with swing. No knee buckling noted now. verbal and heavy tactile cues for upright posture, RW proximity for safety and sequencing, able to accept more weight on RLE and acheive foot flat but unable to active DF to clear during swing phase ? ? ?Stairs ?  ?  ?  ?  ?  ? ? ?Wheelchair Mobility ?  ? ?Modified Rankin (Stroke Patients Only) ?  ? ? ?  ?Balance Overall balance assessment: Needs assistance ?Sitting-balance support: No upper extremity supported, Feet supported ?Sitting balance-Leahy Scale: Fair ?  ?  ?Standing balance support: Bilateral upper extremity supported ?Standing balance-Leahy Scale: Poor ?Standing balance comment: Reliant on BUE with RW ?  ?  ?  ?  ?  ?  ?  ?  ?  ?  ?  ?  ? ?  ?  Cognition Arousal/Alertness: Awake/alert ?Behavior During Therapy: [facility] for tasks assessed/performed ?Overall Cognitive Status: Within Functional Limits for tasks assessed ?  ?  ?  ?  ?  ?  ?  ?  ?  ?  ?  ?  ?  ?  ?  ?  ?  ?  ?  ? ?  ?Exercises Total Joint Exercises ?Ankle  Circles/Pumps: AROM, AAROM, Right, 20 reps, Supine ? ?  ?General Comments   ?  ?  ? ?Pertinent Vitals/Pain Pain Assessment ?Pain Assessment: Faces ?Faces Pain Scale: Hurts a little bit ?Pain Location: R knee ?Pain Descriptors / Indicators: Grimacing, Guarding, Operative site guarding ?Pain Intervention(s): Limited activity within patient's tolerance, Monitored during session, Repositioned, Ice applied  ? ? ?Home Living   ?  ?  ?  ?  ?  ?  ?  ?  ?  ?   ?  ?Prior Function    ?  ?  ?   ? ?PT Goals (current goals can now be found in the care plan section) Acute Rehab PT Goals ?PT Goal Formulation: With patient/family ?Time For Goal Achievement: 09/05/21 ? ?  ?Frequency ? ? ? 7X/week ? ? ? ?  ?PT Plan Current plan remains appropriate  ? ? ?Co-evaluation   ?  ?  ?  ?  ? ?  ?AM-PAC PT "6 Clicks" Mobility   ?Outcome Measure ? Help needed turning from your back to your side while in a flat bed without using bedrails?: A Little ?Help needed moving from lying on your back to sitting on the side of a flat bed without using bedrails?: A Lot ?Help needed moving to and from a bed to a chair (including a wheelchair)?: A Little ?Help needed standing up from a chair using your arms (e.g., wheelchair or bedside chair)?: A Little ?Help needed to walk in hospital room?: A Little ?Help needed climbing 3-5 steps with a railing? : A Lot ?6 Click Score: 16 ? ?  ?End of Session Equipment Utilized During Treatment: Gait belt ?Activity Tolerance: Patient tolerated treatment well;Patient limited by pain ?Patient left: in chair;with call bell/phone within reach;with family/visitor present ?Nurse Communication: Mobility status ?PT Visit Diagnosis: Other abnormalities of gait and mobility (R26.89);Pain;Unsteadiness on feet (R26.81);Muscle weakness (generalized) (M62.81);Difficulty in walking, not elsewhere classified (R26.2) ?Pain - Right/Left: Right ?Pain - part of body: Knee ?  ? ? ?Time: 7253-6644 ?PT Time Calculation (min) (ACUTE ONLY): 28  min ? ?Charges:  $Gait Training: 23-37 mins          ?          ? ?Lenora Boys. PTA ?Acute Rehabilitation Services ?Office: [phone] ? ? ? ?Marlana Salvage Jaaziah Schulke ?08/24/2021, 4:15 PM ? ?

## 2021-08-24 NOTE — Progress Notes (Signed)
Occupational Therapy Treatment ?Patient Details ?Name: [name] [name]. ?MRN: [id number] ?DOB: 04/25/1941 ?Today's Date: 08/24/2021 ? ? ?History of present illness Pt is a 81 y/o male s/p R TKA. PMH includes HTN, CKD. ?  ?OT comments ? Patient received seated in recliner and states less discomfort in recliner than in bed. Discussed LB dressing with AE and patient was instructed on use of sock aide and was mod assist to perform. Patient performed mobility and simulated toilet transfer with RW requiring min assist to power up and min guard for mobility and transfers. Acute OT to continue to follow.   ? ?Recommendations for follow up therapy are one component of a multi-disciplinary discharge planning process, led by the attending physician.  Recommendations may be updated based on patient status, additional functional criteria and insurance authorization. ?   ?Follow Up Recommendations ? Follow physician's recommendations for discharge plan and follow up therapies  ?  ?Assistance Recommended at Discharge Set up Supervision/Assistance  ?Patient can return home with the following ? A little help with walking and/or transfers;A little help with bathing/dressing/bathroom;Assistance with cooking/housework;Help with stairs or ramp for entrance;Assist for transportation ?  ?Equipment Recommendations ? None recommended by OT;Other (comment)  ?  ?Recommendations for Other Services   ? ?  ?Precautions / Restrictions Precautions ?Precautions: Knee ?Precaution Booklet Issued: No ?Precaution Comments: Verbally reviewed knee precautions; provided TKA HEP handout ?Restrictions ?Weight Bearing Restrictions: Yes ?RLE Weight Bearing: Weight bearing as tolerated  ? ? ?  ? ?Mobility Bed Mobility ?Overal bed mobility: Needs Assistance ?  ?  ?  ?  ?  ?  ?General bed mobility comments: up in recliner and in recliner at end of session ?  ? ?Transfers ?Overall transfer level: Needs assistance ?Equipment used: Rolling walker (2 wheels) ?Transfers:  Sit to/from Stand, Bed to chair/wheelchair/BSC ?Sit to Stand: Min guard, Min assist ?  ?  ?  ?  ?  ?General transfer comment: min assist to power up from recliner and min guard while up ?  ?  ?Balance Overall balance assessment: Needs assistance ?Sitting-balance support: No upper extremity supported, Feet supported ?Sitting balance-Leahy Scale: Fair ?  ?  ?Standing balance support: Bilateral upper extremity supported ?Standing balance-Leahy Scale: Poor ?Standing balance comment: Reliant on BUE with RW ?  ?  ?  ?  ?  ?  ?  ?  ?  ?  ?  ?   ? ?ADL either performed or assessed with clinical judgement  ? ?ADL Overall ADL's : Needs assistance/impaired ?  ?  ?  ?  ?  ?  ?  ?  ?  ?  ?Lower Body Dressing: Moderate assistance;Sitting/lateral leans;Sit to/from stand ?Lower Body Dressing Details (indicate cue type and reason): donn socks with sock aide ?Toilet Transfer: Minimal assistance ?Toilet Transfer Details (indicate cue type and reason): simulated with chair ?  ?  ?  ?  ?  ?General ADL Comments: discussed using AE for LB dressing and used sock aide to donn socks ?  ? ?Extremity/Trunk Assessment   ?  ?  ?  ?  ?  ? ?Vision   ?  ?  ?Perception   ?  ?Praxis   ?  ? ?Cognition Arousal/Alertness: Awake/alert ?Behavior During Therapy: [facility] for tasks assessed/performed ?Overall Cognitive Status: Within Functional Limits for tasks assessed ?  ?  ?  ?  ?  ?  ?  ?  ?  ?  ?  ?  ?  ?  ?  ?  ?  ?  ?  ?   ?  Exercises   ? ?  ?Shoulder Instructions   ? ? ?  ?General Comments    ? ? ?Pertinent Vitals/ Pain       Pain Assessment ?Pain Assessment: Faces ?Faces Pain Scale: Hurts little more ?Pain Location: R knee ?Pain Descriptors / Indicators: Grimacing, Guarding, Operative site guarding ?Pain Intervention(s): Limited activity within patient's tolerance, Premedicated before session, Repositioned ? ?Home Living   ?  ?  ?  ?  ?  ?  ?  ?  ?  ?  ?  ?  ?  ?  ?  ?  ?  ?  ? ?  ?Prior Functioning/Environment    ?  ?  ?  ?   ? ?Frequency ? Min 3X/week   ? ? ? ? ?  ?Progress Toward Goals ? ?OT Goals(current goals can now be found in the care plan section) ? Progress towards OT goals: Progressing toward goals ? ?Acute Rehab OT Goals ?Patient Stated Goal: get better ?OT Goal Formulation: With patient ?Time For Goal Achievement: 09/06/21 ?Potential to Achieve Goals: Good ?ADL Goals ?Pt Will Perform Upper Body Dressing: with modified independence;sitting ?Pt Will Perform Lower Body Dressing: with min assist;sit to/from stand;sitting/lateral leans ?Pt Will Transfer to Toilet: with supervision;ambulating;regular height toilet ?Pt Will Perform Tub/Shower Transfer: with min assist;rolling walker;ambulating;shower seat;Shower transfer  ?Plan Discharge plan remains appropriate   ? ?Co-evaluation ? ? ?   ?  ?  ?  ?  ? ?  ?AM-PAC OT "6 Clicks" Daily Activity     ?Outcome Measure ? ? Help from another person eating meals?: None ?Help from another person taking care of personal grooming?: None ?Help from another person toileting, which includes using toliet, bedpan, or urinal?: A Little ?Help from another person bathing (including washing, rinsing, drying)?: A Little ?Help from another person to put on and taking off regular upper body clothing?: None ?Help from another person to put on and taking off regular lower body clothing?: A Lot ?6 Click Score: 20 ? ?  ?End of Session Equipment Utilized During Treatment: Gait belt;Rolling walker (2 wheels) ? ?OT Visit Diagnosis: Unsteadiness on feet (R26.81);Other abnormalities of gait and mobility (R26.89);Muscle weakness (generalized) (M62.81) ?  ?Activity Tolerance Patient tolerated treatment well ?  ?Patient Left in chair;with call bell/phone within reach ?  ?Nurse Communication Mobility status ?  ? ?   ? ?Time: 8242-3536 ?OT Time Calculation (min): 25 min ? ?Charges: OT General Charges ?$OT Visit: 1 Visit ?OT Treatments ?$Self Care/Home Management : 8-22 mins ?$Therapeutic Activity: 8-22 mins ? ?Alfonse Flavors, OTA ?Acute Rehabilitation  Services  ?Pager [phone] ?Office [phone] ? ? ?[name] [name] ?08/24/2021, 2:56 PM ?

## 2021-08-24 NOTE — Progress Notes (Signed)
Orthopedic Tech Progress Note ?Patient Details:  ?[name] [name]. ?[birth year] ?[id number] ?Called in order to [facility] for AFO ?Patient ID: [name] Hatchet., adult   DOB: 1940-10-03, 81 y.o.   MRN: [id number] ? ?[name] [name] ?08/24/2021, 10:40 AM ? ?

## 2021-08-24 NOTE — Plan of Care (Signed)
  Problem: Education: Goal: Knowledge of General Education information will improve Description: Including pain rating scale, medication(s)/side effects and non-pharmacologic comfort measures Outcome: Progressing   Problem: Clinical Measurements: Goal: Ability to maintain clinical measurements within normal limits will improve Outcome: Progressing   Problem: Activity: Goal: Risk for activity intolerance will decrease Outcome: Progressing   Problem: Nutrition: Goal: Adequate nutrition will be maintained Outcome: Progressing   Problem: Elimination: Goal: Will not experience complications related to bowel motility Outcome: Progressing   Problem: Pain Managment: Goal: General experience of comfort will improve Outcome: Progressing   Problem: Safety: Goal: Ability to remain free from injury will improve Outcome: Progressing   Problem: Skin Integrity: Goal: Risk for impaired skin integrity will decrease Outcome: Progressing   

## 2021-08-25 MED ORDER — METHOCARBAMOL 500 MG PO TABS: 500.0000 mg | ORAL_TABLET | Freq: Four times a day (QID) | ORAL | 1 refills | Status: DC | PRN

## 2021-08-25 MED ORDER — OXYCODONE HCL 5 MG PO TABS: 5.0000 mg | ORAL_TABLET | ORAL | 0 refills | Status: DC | PRN

## 2021-08-25 MED ORDER — ASPIRIN 81 MG PO CHEW
81.0000 mg | CHEWABLE_TABLET | Freq: Two times a day (BID) | ORAL | 1 refills | Status: DC
Start: 2021-08-25 — End: 2021-08-29

## 2021-08-25 NOTE — Progress Notes (Signed)
Patient ID: [name] [name]., adult   DOB: 11-15-1940, 81 y.o.   MRN: [id number] ?The patient reports thus far that he is feeling better today.  His right operative knee is stable.  He does show evidence of foot drop and weakness in that leg.  He does have an AFO now.  We will resume therapy today.  There is potential for discharge to home later today depending on his progress.  I will check on him this afternoon. ?

## 2021-08-25 NOTE — Progress Notes (Signed)
Physical Therapy Treatment ?Patient Details ?Name: [name] [name]. ?MRN: [id number] ?DOB: 03-Apr-1941 ?Today's Date: 08/25/2021 ? ? ?History of Present Illness Pt is a 81 y/o male s/p R TKA. PMH includes HTN, CKD. ? ?  ?PT Comments  ? ? Pt received supine and agreeable to session with good progress towards goals with focus on stair training for safe d/c home. Orthotic rep Scott arriving with shoe attachment for AFO, donned with shoes for session. Pt requiring gross min assist for bed mobility and transfers throughout. Pt able to ascend/descend 2 steps in therapy gym x2 with second attempt to simulate home environment, using BUE on right rail only and ascending/descending sideways. Pt with good recall of sequencing and no LOB. Pt with improved ambulation tolerance and gait mechanics this session with ability to clear R foot during swing phase and have foot flat during stance phase with shoes and AFO donned.  Pt continues to benefit from skilled PT services to progress toward functional mobility goals.  ?  ?Recommendations for follow up therapy are one component of a multi-disciplinary discharge planning process, led by the attending physician.  Recommendations may be updated based on patient status, additional functional criteria and insurance authorization. ? ?Follow Up Recommendations ? Follow physician's recommendations for discharge plan and follow up therapies ?  ?  ?Assistance Recommended at Discharge Intermittent Supervision/Assistance  ?Patient can return home with the following Help with stairs or ramp for entrance;Assistance with cooking/housework;Assist for transportation;A little help with walking and/or transfers;A little help with bathing/dressing/bathroom ?  ?Equipment Recommendations ? None recommended by PT  ?  ?Recommendations for Other Services   ? ? ?  ?Precautions / Restrictions Precautions ?Precautions: Knee ?Precaution Booklet Issued: No ?Precaution Comments: Verbally reviewed knee precautions;  provided TKA HEP handout ?Restrictions ?Weight Bearing Restrictions: Yes ?RLE Weight Bearing: Weight bearing as tolerated  ?  ? ?Mobility ? Bed Mobility ?Overal bed mobility: Needs Assistance ?Bed Mobility: Supine to Sit ?  ?  ?Supine to sit: Min assist ?  ?  ?General bed mobility comments: light min assist to elevate trunk, pt able to self mobilize RLE with strap to/off EOB ?  ? ?Transfers ?Overall transfer level: Needs assistance ?Equipment used: Rolling walker (2 wheels) ?Transfers: Sit to/from Stand, Bed to chair/wheelchair/BSC ?Sit to Stand: Min guard, Min assist ?  ?  ?  ?  ?  ?General transfer comment: min assist to power up from EOB, min guard from recliner ?  ? ?Ambulation/Gait ?Ambulation/Gait assistance: Min guard, Min assist (assist for cues) ?Gait Distance (Feet): 75 Feet ?Assistive device: Rolling walker (2 wheels) ?Gait Pattern/deviations: Step-to pattern, Decreased stance time - right, Decreased dorsiflexion - right, Decreased weight shift to right, Knee flexed in stance - right, Antalgic, Trunk flexed ?Gait velocity: decr ?  ?  ?General Gait Details: min assist down to min guard throughout, better ability to advance RLE, able to clear R toes and heel strike with shoes and AFO donned. good recall and demonstration of RW/LE sequencing. ? ? ?Stairs ?Stairs: Yes ?Stairs assistance: Min guard, Min assist ?Stair Management: One rail Right, Two rails, Step to pattern, Forwards, Sideways ?Number of Stairs: 4 (up/down 2 steps in gym x2) ?General stair comments: up/down with 2 rails on first trial with good recall for sequencing, with no LOB and good stabilty. Up/down sideways with BUE on right rail on attempt #2 to better simulate entry steps to home with good result. ? ? ?Wheelchair Mobility ?  ? ?Modified Rankin (Stroke Patients Only) ?  ? ? ?  ?  Balance Overall balance assessment: Needs assistance ?Sitting-balance support: No upper extremity supported, Feet supported ?Sitting balance-Leahy Scale: Fair ?  ?   ?Standing balance support: Bilateral upper extremity supported ?Standing balance-Leahy Scale: Poor ?Standing balance comment: Reliant on BUE with RW ?  ?  ?  ?  ?  ?  ?  ?  ?  ?  ?  ?  ? ?  ?Cognition Arousal/Alertness: Awake/alert ?Behavior During Therapy: [facility] for tasks assessed/performed ?Overall Cognitive Status: Within Functional Limits for tasks assessed ?  ?  ?  ?  ?  ?  ?  ?  ?  ?  ?  ?  ?  ?  ?  ?  ?  ?  ?  ? ?  ?Exercises   ? ?  ?General Comments   ?  ?  ? ?Pertinent Vitals/Pain Pain Assessment ?Pain Assessment: No/denies pain  ? ? ?Home Living   ?  ?  ?  ?  ?  ?  ?  ?  ?  ?   ?  ?Prior Function    ?  ?  ?   ? ?PT Goals (current goals can now be found in the care plan section) Acute Rehab PT Goals ?PT Goal Formulation: With patient/family ?Time For Goal Achievement: 09/05/21 ? ?  ?Frequency ? ? ? 7X/week ? ? ? ?  ?PT Plan Current plan remains appropriate  ? ? ?Co-evaluation   ?  ?  ?  ?  ? ?  ?AM-PAC PT "6 Clicks" Mobility   ?Outcome Measure ? Help needed turning from your back to your side while in a flat bed without using bedrails?: A Little ?Help needed moving from lying on your back to sitting on the side of a flat bed without using bedrails?: A Lot ?Help needed moving to and from a bed to a chair (including a wheelchair)?: A Little ?Help needed standing up from a chair using your arms (e.g., wheelchair or bedside chair)?: A Little ?Help needed to walk in hospital room?: A Little ?Help needed climbing 3-5 steps with a railing? : A Little ?6 Click Score: 17 ? ?  ?End of Session Equipment Utilized During Treatment: Gait belt ?Activity Tolerance: Patient tolerated treatment well;Patient limited by pain ?Patient left: in chair;with call bell/phone within reach;with family/visitor present ?Nurse Communication: Mobility status ?PT Visit Diagnosis: Other abnormalities of gait and mobility (R26.89);Pain;Unsteadiness on feet (R26.81);Muscle weakness (generalized) (M62.81);Difficulty in walking, not elsewhere  classified (R26.2) ?Pain - Right/Left: Right ?Pain - part of body: Knee ?  ? ? ?Time: [id number] ?PT Time Calculation (min) (ACUTE ONLY): 37 min ? ?Charges:  $Gait Training: 23-37 mins          ?          ? ?Audry Riles. PTA ?Acute Rehabilitation Services ?Office: [phone] ? ? ? ? ?Betsey Holiday Shey Bartmess ?08/25/2021, 11:56 AM ? ?

## 2021-08-25 NOTE — Discharge Summary (Signed)
?Patient ID: ?[name] Delray Alt. ?MRN: [id number] ?DOB/AGE: Sep 24, 1940 81 y.o. ? ?Admit date: 08/22/2021 ?Discharge date: 08/25/2021 ? ?Admission Diagnoses:  ?Principal Problem: ?  Unilateral primary osteoarthritis, right knee ?Active Problems: ?  Status post right knee replacement ?  Status post total knee replacement, right ? ? ?Discharge Diagnoses:  ?Same ? ?Past Medical History:  ?Diagnosis Date  ? BPH (benign prostatic hyperplasia)   ? Chronic kidney disease   ? kidney stones  ? Headache(784.0)   ? migraines  ? History of kidney stones   ? Hyperlipidemia   ? Hypertension   ? ? ?Surgeries: Procedure(s): ?RIGHT TOTAL KNEE ARTHROPLASTY on 08/22/2021 ?  ?Consultants:  ? ?Discharged Condition: Improved ? ?Hospital Course: [name] [name]. is an 81 y.o. adult who was admitted 08/22/2021 for operative treatment ofUnilateral primary osteoarthritis, right knee. Patient has severe unremitting pain that affects sleep, daily activities, and work/hobbies. After pre-op clearance the patient was taken to the operating room on 08/22/2021 and underwent  Procedure(s): ?RIGHT TOTAL KNEE ARTHROPLASTY.   ? ?Patient was given perioperative antibiotics:  ?Anti-infectives (From admission, onward)  ? ? Start     Dose/Rate Route Frequency Ordered Stop  ? 08/22/21 1430  ceFAZolin (ANCEF) IVPB 1 g/50 mL premix       ? 1 g ?100 mL/hr over 30 Minutes Intravenous Every 6 hours 08/22/21 1334 08/22/21 2022  ? 08/22/21 0924  ceFAZolin (ANCEF) 2-4 GM/100ML-% IVPB       ?Note to Pharmacy: [name] [name]: cabinet override  ?    08/22/21 0924 08/22/21 1453  ? ?  ?  ? ?Patient was given sequential compression devices, early ambulation, and chemoprophylaxis to prevent DVT. ? ?Patient benefited maximally from hospital stay and there were no complications.   ? ?Recent vital signs: Patient Vitals for the past 24 hrs: ? BP Temp Temp src Pulse Resp SpO2  ?08/25/21 0802 130/67 98.8 ?F (37.1 ?C) Oral 81 17 97 %  ?08/25/21 0459 136/82 -- -- 72 16 98 %  ?08/24/21 2120  (!) 145/69 98.3 ?F (36.8 ?C) Oral 71 17 99 %  ?08/24/21 1452 123/66 97.8 ?F (36.6 ?C) Oral 66 16 96 %  ?  ? ?Recent laboratory studies:  ?Recent Labs  ?  08/23/21 ?9470  ?WBC 14.3*  ?HGB 12.1*  ?HCT 36.8*  ?PLT 201  ?NA 135  ?K 4.2  ?CL 106  ?CO2 22  ?BUN 24*  ?CREATININE 1.22  ?GLUCOSE 129*  ?CALCIUM 8.4*  ? ? ? ?Discharge Medications:   ?Allergies as of 08/25/2021   ?No Known Allergies ?  ? ?  ?Medication List  ?  ? ?TAKE these medications   ? ?acetaminophen 325 MG tablet ?Commonly known as: TYLENOL ?Take 650 mg by mouth every 6 (six) hours as needed for moderate pain. ?  ?aspirin 81 MG chewable tablet ?Chew 1 tablet (81 mg total) by mouth 2 (two) times daily. ?  ?bisoprolol-hydrochlorothiazide 10-6.25 MG tablet ?Commonly known as: ZIAC ?Take 1 tablet by mouth daily. ?  ?finasteride 5 MG tablet ?Commonly known as: PROSCAR ?Take 5 mg by mouth daily. ?  ?irbesartan 150 MG tablet ?Commonly known as: AVAPRO ?Take 150 mg by mouth daily. ?  ?methocarbamol 500 MG tablet ?Commonly known as: ROBAXIN ?Take 1 tablet (500 mg total) by mouth every 6 (six) hours as needed for muscle spasms. ?  ?naproxen sodium 220 MG tablet ?Commonly known as: ALEVE ?Take 220 mg by mouth daily as needed (pain). ?  ?oxyCODONE 5 MG immediate release  tablet ?Commonly known as: Oxy IR/ROXICODONE ?Take 1-2 tablets (5-10 mg total) by mouth every 4 (four) hours as needed for moderate pain (pain score 4-6). ?  ?tamsulosin 0.4 MG Caps capsule ?Commonly known as: FLOMAX ?Take by mouth. ?  ? ?  ? ?  ?  ? ? ?  ?Durable Medical Equipment  ?(From admission, onward)  ?  ? ? ?  ? ?  Start     Ordered  ? 08/22/21 1335  DME 3 n 1  Once       ? 08/22/21 1334  ? 08/22/21 1335  DME Walker rolling  Once       ?Question Answer Comment  ?Walker: With 5 Inch Wheels   ?Patient needs a walker to treat with the following condition Status post right knee replacement   ?  ? 08/22/21 1334  ? ?  ?  ? ?  ? ? ?Diagnostic Studies: DG Knee Right Port ? ?Result Date:  08/22/2021 ?CLINICAL DATA:  Postop imaging. EXAM: PORTABLE RIGHT KNEE - 1-2 VIEW COMPARISON:  Radiographs 10/19/2020. FINDINGS: Status post interval right total knee arthroplasty. The hardware appears well positioned. On the lateral view, there is a 3.4 cm ossific density projecting over the distal femoral metaphysis which could reflect a fracture fragment. This is not well seen on the frontal examination. There is lucency projecting over the more proximal femur. Skin staples are in place. There is gas within the knee joint and surrounding soft tissues. IMPRESSION: Cannot exclude a nondisplaced distal femoral fracture following interval total knee arthroplasty. If this is unknown, consider CT for further evaluation. Electronically Signed   By: [name] [name] M.D.   On: 08/22/2021 12:31   ? ?Disposition: Discharge disposition: 01-Home or Self Care ? ? ? ? ? ? ? ? ? Follow-up Information   ? ? [name] Hitch, MD. Go on 09/04/2021.   ?Specialty: Orthopedic Surgery ?Why: at 9:45 am for your first post op appointment with Dr. Magnus [name] in his office. ?Contact information: ?[address] ?[place] [address] ?[phone] ? ? ?  ?  ? ? [facility] Follow up.   ?Specialty: Home Health Services ?Why: your home health PT services will be provided by [facility] ?Contact information: ?[address] ?[address] ?[place] [address] ?[phone] ? ? ?  ?  ? ? [name] Floro, MD Follow up.   ?Specialty: Family Medicine ?Contact information: ?[address] ?[place] [address] ?[phone] ? ? ?  ?  ? ? [facility] Go on 09/04/2021.   ?Specialty: Physical Therapy ?Why: at 3:45 pm for your first Outpatient Physical Therapy appointment in the [facility] location. Please arrive approximately 15 minutes prior to your appointment for any necessary paperwork. ?Contact information: ?[facility] ?[address] ?[address] ?[place] [address] ?[phone] ? ? ?   ?  ? ?  ?  ? ?  ? ? ? ?Signed: ?[name] [name] ?08/25/2021, 1:22 PM ? ? ? ?

## 2021-08-25 NOTE — Progress Notes (Signed)
Physical Therapy Treatment ?Patient Details ?Name: [name] [name]. ?MRN: [id number] ?DOB: [birth year] ?Today's Date: 08/25/2021 ? ? ?History of Present Illness Pt is a 81 y/o male s/p R TKA. PMH includes HTN, CKD. ? ?  ?PT Comments  ? ? Pt seen for second session to focus on HEP and address pt concerns before d/c home. Pt with good tolerance for all HEP exercises verbalizing and demonstrating understanding of all and frequency. Educated pt re; car entry/exit, ice, any and all precautions, donning/doffing AFO, and importance of continued mobility, pt and spouse verbalizing understanding. Anticipate safe discharge with family assistance once medically cleared, will follow acutely. Pt continues to benefit from skilled PT services to progress toward functional mobility goals.  ?  ?Recommendations for follow up therapy are one component of a multi-disciplinary discharge planning process, led by the attending physician.  Recommendations may be updated based on patient status, additional functional criteria and insurance authorization. ? ?Follow Up Recommendations ? Follow physician's recommendations for discharge plan and follow up therapies ?  ?  ?Assistance Recommended at Discharge Intermittent Supervision/Assistance  ?Patient can return home with the following Help with stairs or ramp for entrance;Assistance with cooking/housework;Assist for transportation;A little help with walking and/or transfers;A little help with bathing/dressing/bathroom ?  ?Equipment Recommendations ? None recommended by PT  ?  ?Recommendations for Other Services   ? ? ?  ?Precautions / Restrictions Precautions ?Precautions: Knee ?Precaution Booklet Issued: No ?Precaution Comments: Verbally reviewed knee precautions; provided TKA HEP handout ?Restrictions ?Weight Bearing Restrictions: Yes ?RLE Weight Bearing: Weight bearing as tolerated  ?  ? ?Mobility ? Bed Mobility ?Overal bed mobility: Needs Assistance ?Bed Mobility: Supine to Sit ?  ?  ?Supine to  sit: Min assist ?  ?  ?General bed mobility comments: pt OOB in recliner on arrival ?  ? ?Transfers ?Overall transfer level: Needs assistance ?Equipment used: Rolling walker (2 wheels) ?Transfers: Sit to/from Stand ?Sit to Stand: Min guard ?  ?  ?  ?  ?  ?General transfer comment: min guard from recliner ?  ? ?Ambulation/Gait ? ? ? ?  ?  ? ? ? ?Stairs ? ? ?Wheelchair Mobility ?  ? ?Modified Rankin (Stroke Patients Only) ?  ? ? ?  ?Balance Overall balance assessment: Needs assistance ?Sitting-balance support: No upper extremity supported, Feet supported ?Sitting balance-Leahy Scale: Fair ?  ?  ?Standing balance support: Bilateral upper extremity supported ?Standing balance-Leahy Scale: Poor ?Standing balance comment: Reliant on BUE with RW ?  ?  ?  ?  ?  ?  ?  ?  ?  ?  ?  ?  ? ?  ?Cognition Arousal/Alertness: Awake/alert ?Behavior During Therapy: [facility] for tasks assessed/performed ?Overall Cognitive Status: Within Functional Limits for tasks assessed ?  ?  ?  ?  ?  ?  ?  ?  ?  ?  ?  ?  ?  ?  ?  ?  ?  ?  ?  ? ?  ?Exercises Total Joint Exercises ?Ankle Circles/Pumps: AROM, AAROM, Right, 20 reps, Supine ?Quad Sets: AROM, Right, 5 reps, Supine ?Short Arc Quad: AROM, AAROM, Right, 5 reps, Supine ?Heel Slides: AROM, AAROM, Right, 5 reps, Supine ?Hip ABduction/ADduction: AROM, AAROM, Right, 5 reps, Supine ?Straight Leg Raises: AROM, AAROM, Right, 5 reps, Supine ?Long Arc Quad: AROM, AAROM, Right, 5 reps, Seated ?Knee Flexion: AROM, AAROM, Right, 5 reps, Seated ? ?  ?General Comments   ?  ?  ? ?Pertinent Vitals/Pain Pain Assessment ?Pain Assessment:  Faces ?Faces Pain Scale: Hurts whole lot ?Pain Location: R knee ?Pain Descriptors / Indicators: Grimacing, Guarding, Operative site guarding ?Pain Intervention(s): Limited activity within patient's tolerance, Monitored during session, Repositioned, Patient requesting pain meds-RN notified  ? ? ?Home Living   ?  ?  ?  ?  ?  ?  ?  ?  ?  ?   ?  ?Prior Function    ?  ?  ?   ? ?PT Goals  (current goals can now be found in the care plan section) Acute Rehab PT Goals ?PT Goal Formulation: With patient/family ?Time For Goal Achievement: 09/05/21 ? ?  ?Frequency ? ? ? 7X/week ? ? ? ?  ?PT Plan Current plan remains appropriate  ? ? ?Co-evaluation   ?  ?  ?  ?  ? ?  ?AM-PAC PT "6 Clicks" Mobility   ?Outcome Measure ? Help needed turning from your back to your side while in a flat bed without using bedrails?: A Little ?Help needed moving from lying on your back to sitting on the side of a flat bed without using bedrails?: A Lot ?Help needed moving to and from a bed to a chair (including a wheelchair)?: A Little ?Help needed standing up from a chair using your arms (e.g., wheelchair or bedside chair)?: A Little ?Help needed to walk in hospital room?: A Little ?Help needed climbing 3-5 steps with a railing? : A Little ?6 Click Score: 17 ? ?  ?End of Session Equipment Utilized During Treatment: Gait belt ?Activity Tolerance: Patient tolerated treatment well;Patient limited by pain ?Patient left: in chair;with call bell/phone within reach;with family/visitor present ?Nurse Communication: Mobility status;Patient requests pain meds ?PT Visit Diagnosis: Other abnormalities of gait and mobility (R26.89);Pain;Unsteadiness on feet (R26.81);Muscle weakness (generalized) (M62.81);Difficulty in walking, not elsewhere classified (R26.2) ?Pain - Right/Left: Right ?Pain - part of body: Knee ?  ? ? ?Time: 2694-8546 ?PT Time Calculation (min) (ACUTE ONLY): 26 min ? ?Charges:  $Therapeutic Exercise: 23-37 mins          ?          ?Lenora Boys. PTA ?Acute Rehabilitation Services ?Office: [phone] ? ? ? ?Marlana Salvage Brissa Asante ?08/25/2021, 2:35 PM ? ?

## 2021-08-25 NOTE — Plan of Care (Signed)
?  Problem: Health Behavior/Discharge Planning: Goal: Ability to manage health-related needs will improve Outcome: Progressing   Problem: Clinical Measurements: Goal: Ability to maintain clinical measurements within normal limits will improve Outcome: Progressing   Problem: Activity: Goal: Risk for activity intolerance will decrease Outcome: Progressing   Problem: Coping: Goal: Level of anxiety will decrease Outcome: Progressing   Problem: Safety: Goal: Ability to remain free from injury will improve Outcome: Progressing   

## 2021-08-27 DIAGNOSIS — M545 Low back pain, unspecified: Secondary | ICD-10-CM | POA: Diagnosis not present

## 2021-08-27 DIAGNOSIS — N189 Chronic kidney disease, unspecified: Secondary | ICD-10-CM | POA: Diagnosis not present

## 2021-08-27 DIAGNOSIS — Z96651 Presence of right artificial knee joint: Secondary | ICD-10-CM | POA: Diagnosis not present

## 2021-08-27 DIAGNOSIS — Z9181 History of falling: Secondary | ICD-10-CM | POA: Diagnosis not present

## 2021-08-27 DIAGNOSIS — Z471 Aftercare following joint replacement surgery: Secondary | ICD-10-CM | POA: Diagnosis not present

## 2021-08-27 DIAGNOSIS — G43909 Migraine, unspecified, not intractable, without status migrainosus: Secondary | ICD-10-CM | POA: Diagnosis not present

## 2021-08-27 DIAGNOSIS — K5900 Constipation, unspecified: Secondary | ICD-10-CM | POA: Diagnosis not present

## 2021-08-27 DIAGNOSIS — Z87442 Personal history of urinary calculi: Secondary | ICD-10-CM | POA: Diagnosis not present

## 2021-08-27 DIAGNOSIS — Z7982 Long term (current) use of aspirin: Secondary | ICD-10-CM | POA: Diagnosis not present

## 2021-08-27 DIAGNOSIS — I129 Hypertensive chronic kidney disease with stage 1 through stage 4 chronic kidney disease, or unspecified chronic kidney disease: Secondary | ICD-10-CM | POA: Diagnosis not present

## 2021-08-27 DIAGNOSIS — E785 Hyperlipidemia, unspecified: Secondary | ICD-10-CM | POA: Diagnosis not present

## 2021-08-28 ENCOUNTER — Telehealth: Payer: Self-pay | Admitting: *Deleted

## 2021-08-28 ENCOUNTER — Other Ambulatory Visit: Payer: Self-pay | Admitting: *Deleted

## 2021-08-28 DIAGNOSIS — Z79899 Other long term (current) drug therapy: Secondary | ICD-10-CM | POA: Diagnosis not present

## 2021-08-28 DIAGNOSIS — R339 Retention of urine, unspecified: Secondary | ICD-10-CM | POA: Diagnosis not present

## 2021-08-28 DIAGNOSIS — I10 Essential (primary) hypertension: Secondary | ICD-10-CM | POA: Diagnosis not present

## 2021-08-28 DIAGNOSIS — Z96651 Presence of right artificial knee joint: Secondary | ICD-10-CM

## 2021-08-28 DIAGNOSIS — R39198 Other difficulties with micturition: Secondary | ICD-10-CM | POA: Diagnosis not present

## 2021-08-28 DIAGNOSIS — R109 Unspecified abdominal pain: Secondary | ICD-10-CM | POA: Diagnosis not present

## 2021-08-28 DIAGNOSIS — M25561 Pain in right knee: Secondary | ICD-10-CM

## 2021-08-28 NOTE — Telephone Encounter (Signed)
Received call from patient last night about 7:00 pm that I didn't get until this morning since it was the weekend. I called and wife states they went to the ER early this morning because he was having burning with urination and felt distended. Couldn't void even when trying. Went to [facility] location) and was seen in ER- did UA, bladder scan. 780 ml drained after foley placed. UA seems to be negative for infection. They sent him home with a foley in place and told him to call his urologist today. Doesn't appear any medications were called in due to negative UA. I told his wife I'd make you aware.  ?

## 2021-08-28 NOTE — Telephone Encounter (Signed)
Spoke with patient again just now and he states his leg is very swollen, which I explained swelling was normal at this point. We discussed ice, elevation, compression and he then reported he is having moderate to severe pain in the calf with every step he is taking. He is taking his 2 aspirin daily as prescribed. Recommendations? ?

## 2021-08-28 NOTE — Telephone Encounter (Signed)
Will order Lower extremity doppler to R/O DVT per Verbal order from Dr. Magnus Ivan. ?

## 2021-08-29 ENCOUNTER — Inpatient Hospital Stay (HOSPITAL_COMMUNITY)
Admission: EM | Admit: 2021-08-29 | Discharge: 2021-09-05 | DRG: 299 | Disposition: A | Discharge status: Skilled Nursing Facility | Payer: Medicare Other | Attending: Internal Medicine | Admitting: Internal Medicine

## 2021-08-29 ENCOUNTER — Encounter (HOSPITAL_COMMUNITY): Payer: Self-pay | Admitting: *Deleted

## 2021-08-29 ENCOUNTER — Ambulatory Visit (HOSPITAL_COMMUNITY)
Admission: RE | Admit: 2021-08-29 | Discharge: 2021-08-29 | Disposition: A | Discharge status: Home / Self Care | Payer: Medicare Other | Source: Ambulatory Visit | Attending: Orthopaedic Surgery | Admitting: Orthopaedic Surgery

## 2021-08-29 ENCOUNTER — Emergency Department (HOSPITAL_COMMUNITY): Payer: Medicare Other

## 2021-08-29 ENCOUNTER — Emergency Department (HOSPITAL_BASED_OUTPATIENT_CLINIC_OR_DEPARTMENT_OTHER): Payer: Medicare Other

## 2021-08-29 ENCOUNTER — Other Ambulatory Visit: Payer: Self-pay

## 2021-08-29 DIAGNOSIS — R609 Edema, unspecified: Secondary | ICD-10-CM

## 2021-08-29 DIAGNOSIS — Z96651 Presence of right artificial knee joint: Secondary | ICD-10-CM | POA: Diagnosis present

## 2021-08-29 DIAGNOSIS — D72829 Elevated white blood cell count, unspecified: Secondary | ICD-10-CM | POA: Diagnosis present

## 2021-08-29 DIAGNOSIS — E669 Obesity, unspecified: Secondary | ICD-10-CM | POA: Diagnosis present

## 2021-08-29 DIAGNOSIS — Z6831 Body mass index (BMI) 31.0-31.9, adult: Secondary | ICD-10-CM | POA: Diagnosis not present

## 2021-08-29 DIAGNOSIS — I82441 Acute embolism and thrombosis of right tibial vein: Secondary | ICD-10-CM | POA: Diagnosis not present

## 2021-08-29 DIAGNOSIS — Z87442 Personal history of urinary calculi: Secondary | ICD-10-CM

## 2021-08-29 DIAGNOSIS — Z7982 Long term (current) use of aspirin: Secondary | ICD-10-CM

## 2021-08-29 DIAGNOSIS — M6281 Muscle weakness (generalized): Secondary | ICD-10-CM | POA: Diagnosis not present

## 2021-08-29 DIAGNOSIS — Z8049 Family history of malignant neoplasm of other genital organs: Secondary | ICD-10-CM | POA: Diagnosis not present

## 2021-08-29 DIAGNOSIS — M545 Low back pain, unspecified: Secondary | ICD-10-CM | POA: Diagnosis not present

## 2021-08-29 DIAGNOSIS — I82461 Acute embolism and thrombosis of right calf muscular vein: Secondary | ICD-10-CM | POA: Diagnosis not present

## 2021-08-29 DIAGNOSIS — J9811 Atelectasis: Secondary | ICD-10-CM | POA: Diagnosis not present

## 2021-08-29 DIAGNOSIS — R0902 Hypoxemia: Secondary | ICD-10-CM | POA: Diagnosis not present

## 2021-08-29 DIAGNOSIS — K8020 Calculus of gallbladder without cholecystitis without obstruction: Secondary | ICD-10-CM | POA: Diagnosis present

## 2021-08-29 DIAGNOSIS — N401 Enlarged prostate with lower urinary tract symptoms: Secondary | ICD-10-CM | POA: Diagnosis present

## 2021-08-29 DIAGNOSIS — N183 Chronic kidney disease, stage 3 unspecified: Secondary | ICD-10-CM

## 2021-08-29 DIAGNOSIS — R7989 Other specified abnormal findings of blood chemistry: Secondary | ICD-10-CM | POA: Diagnosis not present

## 2021-08-29 DIAGNOSIS — I129 Hypertensive chronic kidney disease with stage 1 through stage 4 chronic kidney disease, or unspecified chronic kidney disease: Secondary | ICD-10-CM | POA: Diagnosis present

## 2021-08-29 DIAGNOSIS — Z743 Need for continuous supervision: Secondary | ICD-10-CM | POA: Diagnosis not present

## 2021-08-29 DIAGNOSIS — N138 Other obstructive and reflux uropathy: Secondary | ICD-10-CM | POA: Diagnosis not present

## 2021-08-29 DIAGNOSIS — I82451 Acute embolism and thrombosis of right peroneal vein: Secondary | ICD-10-CM | POA: Diagnosis present

## 2021-08-29 DIAGNOSIS — R338 Other retention of urine: Secondary | ICD-10-CM | POA: Diagnosis not present

## 2021-08-29 DIAGNOSIS — I10 Essential (primary) hypertension: Secondary | ICD-10-CM | POA: Diagnosis not present

## 2021-08-29 DIAGNOSIS — Z79899 Other long term (current) drug therapy: Secondary | ICD-10-CM

## 2021-08-29 DIAGNOSIS — R54 Age-related physical debility: Secondary | ICD-10-CM | POA: Diagnosis not present

## 2021-08-29 DIAGNOSIS — R531 Weakness: Secondary | ICD-10-CM | POA: Diagnosis not present

## 2021-08-29 DIAGNOSIS — D649 Anemia, unspecified: Secondary | ICD-10-CM | POA: Diagnosis present

## 2021-08-29 DIAGNOSIS — Z466 Encounter for fitting and adjustment of urinary device: Secondary | ICD-10-CM | POA: Diagnosis not present

## 2021-08-29 DIAGNOSIS — R6889 Other general symptoms and signs: Secondary | ICD-10-CM | POA: Diagnosis not present

## 2021-08-29 DIAGNOSIS — Z833 Family history of diabetes mellitus: Secondary | ICD-10-CM | POA: Diagnosis not present

## 2021-08-29 DIAGNOSIS — I82401 Acute embolism and thrombosis of unspecified deep veins of right lower extremity: Secondary | ICD-10-CM | POA: Diagnosis present

## 2021-08-29 DIAGNOSIS — Z741 Need for assistance with personal care: Secondary | ICD-10-CM | POA: Diagnosis not present

## 2021-08-29 DIAGNOSIS — I2699 Other pulmonary embolism without acute cor pulmonale: Secondary | ICD-10-CM | POA: Diagnosis not present

## 2021-08-29 DIAGNOSIS — K8080 Other cholelithiasis without obstruction: Secondary | ICD-10-CM | POA: Diagnosis not present

## 2021-08-29 DIAGNOSIS — Z8249 Family history of ischemic heart disease and other diseases of the circulatory system: Secondary | ICD-10-CM

## 2021-08-29 DIAGNOSIS — G8929 Other chronic pain: Secondary | ICD-10-CM | POA: Diagnosis not present

## 2021-08-29 DIAGNOSIS — E785 Hyperlipidemia, unspecified: Secondary | ICD-10-CM | POA: Diagnosis not present

## 2021-08-29 DIAGNOSIS — N1831 Chronic kidney disease, stage 3a: Secondary | ICD-10-CM | POA: Diagnosis present

## 2021-08-29 DIAGNOSIS — Z87891 Personal history of nicotine dependence: Secondary | ICD-10-CM

## 2021-08-29 DIAGNOSIS — H93299 Other abnormal auditory perceptions, unspecified ear: Secondary | ICD-10-CM | POA: Diagnosis not present

## 2021-08-29 DIAGNOSIS — R079 Chest pain, unspecified: Secondary | ICD-10-CM | POA: Diagnosis not present

## 2021-08-29 DIAGNOSIS — R2681 Unsteadiness on feet: Secondary | ICD-10-CM | POA: Diagnosis not present

## 2021-08-29 LAB — BASIC METABOLIC PANEL
Anion gap: 8 (ref 5–15)
BUN: 23 mg/dL (ref 8–23)
CO2: 23 mmol/L (ref 22–32)
Calcium: 8.4 mg/dL — ABNORMAL LOW (ref 8.9–10.3)
Chloride: 104 mmol/L (ref 98–111)
Creatinine, Ser: 1.26 mg/dL — ABNORMAL HIGH (ref 0.61–1.24)
GFR, Estimated: 58 mL/min — ABNORMAL LOW (ref >60)
Glucose, Bld: 123 mg/dL — ABNORMAL HIGH (ref 70–99)
Potassium: 4.5 mmol/L (ref 3.5–5.1)
Sodium: 135 mmol/L (ref 135–145)

## 2021-08-29 LAB — CBC
HCT: 34.4 % — ABNORMAL LOW (ref 39.0–52.0)
Hemoglobin: 11.3 g/dL — ABNORMAL LOW (ref 13.0–17.0)
MCH: 29.8 pg (ref 26.0–34.0)
MCHC: 32.8 g/dL (ref 30.0–36.0)
MCV: 90.8 fL (ref 80.0–100.0)
Platelets: 231 10*3/uL (ref 150–400)
RBC: 3.79 MIL/uL — ABNORMAL LOW (ref 4.22–5.81)
RDW: 13.3 % (ref 11.5–15.5)
WBC: 11.6 10*3/uL — ABNORMAL HIGH (ref 4.0–10.5)
nRBC: 0 % (ref 0.0–0.2)

## 2021-08-29 LAB — TROPONIN I (HIGH SENSITIVITY)
Troponin I (High Sensitivity): 7 ng/L (ref <18)
Troponin I (High Sensitivity): 7 ng/L (ref <18)

## 2021-08-29 LAB — BRAIN NATRIURETIC PEPTIDE: B Natriuretic Peptide: 174.2 pg/mL — ABNORMAL HIGH (ref 0.0–100.0)

## 2021-08-29 LAB — URINALYSIS, ROUTINE W REFLEX MICROSCOPIC
Bacteria, UA: NONE SEEN
Bilirubin Urine: NEGATIVE
Glucose, UA: NEGATIVE mg/dL
Ketones, ur: NEGATIVE mg/dL
Leukocytes,Ua: NEGATIVE
Nitrite: NEGATIVE
Protein, ur: NEGATIVE mg/dL
Specific Gravity, Urine: 1.017 (ref 1.005–1.030)
pH: 6 (ref 5.0–8.0)

## 2021-08-29 MED ORDER — HEPARIN (PORCINE) 25000 UT/250ML-% IV SOLN: 1400.0000 [IU]/h | INTRAVENOUS | Status: DC

## 2021-08-29 MED ORDER — METHOCARBAMOL 500 MG PO TABS
500.0000 mg | ORAL_TABLET | Freq: Four times a day (QID) | ORAL | Status: DC | PRN
  Administered 2021-08-30 – 2021-09-05 (×9): 500 mg via ORAL
  Filled 2021-08-29 (×9): qty 1

## 2021-08-29 MED ORDER — APIXABAN 5 MG PO TABS
5.0000 mg | ORAL_TABLET | Freq: Two times a day (BID) | ORAL | Status: DC
  Administered 2021-09-05: 5 mg via ORAL
  Filled 2021-08-29: qty 1

## 2021-08-29 MED ORDER — ACETAMINOPHEN 650 MG RE SUPP: 650.0000 mg | Freq: Four times a day (QID) | RECTAL | Status: DC | PRN

## 2021-08-29 MED ORDER — TAMSULOSIN HCL 0.4 MG PO CAPS
0.4000 mg | ORAL_CAPSULE | Freq: Two times a day (BID) | ORAL | Status: DC
  Administered 2021-08-30 – 2021-09-05 (×13): 0.4 mg via ORAL
  Filled 2021-08-29 (×13): qty 1

## 2021-08-29 MED ORDER — ACETAMINOPHEN 325 MG PO TABS
650.0000 mg | ORAL_TABLET | Freq: Four times a day (QID) | ORAL | Status: DC | PRN
Start: 2021-08-29 — End: 2021-09-05
  Administered 2021-08-30 – 2021-09-03 (×5): 650 mg via ORAL
  Filled 2021-08-29 (×5): qty 2

## 2021-08-29 MED ORDER — IRBESARTAN 150 MG PO TABS
150.0000 mg | ORAL_TABLET | Freq: Every day | ORAL | Status: DC
Start: 2021-08-29 — End: 2021-09-01
  Administered 2021-08-29 – 2021-08-31 (×3): 150 mg via ORAL
  Filled 2021-08-29 (×3): qty 1

## 2021-08-29 MED ORDER — ONDANSETRON HCL 4 MG PO TABS: 4.0000 mg | ORAL_TABLET | Freq: Four times a day (QID) | ORAL | Status: DC | PRN

## 2021-08-29 MED ORDER — BISOPROLOL-HYDROCHLOROTHIAZIDE 10-6.25 MG PO TABS
1.0000 | ORAL_TABLET | Freq: Every day | ORAL | Status: DC
  Administered 2021-08-29 – 2021-08-31 (×3): 1 via ORAL
  Filled 2021-08-29 (×4): qty 1

## 2021-08-29 MED ORDER — ONDANSETRON HCL 4 MG/2ML IJ SOLN: 4.0000 mg | Freq: Four times a day (QID) | INTRAMUSCULAR | Status: DC | PRN

## 2021-08-29 MED ORDER — ALBUTEROL SULFATE (2.5 MG/3ML) 0.083% IN NEBU: 2.5000 mg | INHALATION_SOLUTION | Freq: Four times a day (QID) | RESPIRATORY_TRACT | Status: DC | PRN

## 2021-08-29 MED ORDER — SODIUM CHLORIDE 0.9% FLUSH
3.0000 mL | Freq: Two times a day (BID) | INTRAVENOUS | Status: DC
  Administered 2021-08-29 – 2021-09-05 (×14): 3 mL via INTRAVENOUS

## 2021-08-29 MED ORDER — APIXABAN 5 MG PO TABS
10.0000 mg | ORAL_TABLET | Freq: Two times a day (BID) | ORAL | Status: AC
  Administered 2021-08-29 – 2021-09-04 (×14): 10 mg via ORAL
  Filled 2021-08-29 (×14): qty 2

## 2021-08-29 MED ORDER — FINASTERIDE 5 MG PO TABS
5.0000 mg | ORAL_TABLET | Freq: Every day | ORAL | Status: DC
  Administered 2021-08-29 – 2021-09-05 (×8): 5 mg via ORAL
  Filled 2021-08-29 (×8): qty 1

## 2021-08-29 MED ORDER — IOHEXOL 350 MG/ML SOLN
100.0000 mL | Freq: Once | INTRAVENOUS | Status: AC | PRN
  Administered 2021-08-29: 60 mL via INTRAVENOUS

## 2021-08-29 MED ORDER — HEPARIN BOLUS VIA INFUSION
5500.0000 [IU] | Freq: Once | INTRAVENOUS | Status: DC
  Filled 2021-08-29: qty 5500

## 2021-08-29 MED ORDER — OXYCODONE HCL 5 MG PO TABS
5.0000 mg | ORAL_TABLET | ORAL | Status: DC | PRN
  Administered 2021-08-29 – 2021-09-05 (×20): 5 mg via ORAL
  Filled 2021-08-29 (×20): qty 1

## 2021-08-29 MED ORDER — OXYCODONE-ACETAMINOPHEN 5-325 MG PO TABS
1.0000 | ORAL_TABLET | ORAL | Status: AC | PRN
  Administered 2021-08-29 (×2): 1 via ORAL
  Filled 2021-08-29 (×2): qty 1

## 2021-08-29 MED ORDER — CALCIUM GLUCONATE-NACL 2-0.675 GM/100ML-% IV SOLN
2.0000 g | Freq: Once | INTRAVENOUS | Status: AC
  Administered 2021-08-29: 2000 mg via INTRAVENOUS
  Filled 2021-08-29: qty 100

## 2021-08-29 MED ORDER — SODIUM CHLORIDE 0.9 % IV BOLUS
1000.0000 mL | Freq: Once | INTRAVENOUS | Status: AC
  Administered 2021-08-29: 1000 mL via INTRAVENOUS

## 2021-08-29 NOTE — Progress Notes (Signed)
Patient ID: [name] Gienger., male   DOB: 06-15-1940, 81 y.o.   MRN: [id number] ?I did come by the bedside earlier this afternoon to see [name] [name] in the emergency room.  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  He has been put on blood thinning medication and is being followed closely by the medicine service.  I did assess his right knee incision at the bedside and change the dressing.  He continues to have foot drop on that right side.  During his hospitalization last week we ordered an AFO and does have that available.  From an orthopedic standpoint, he can be up with therapy with weightbearing as tolerated on his right lower extremity and working on range of motion of his right knee.  He did have a Foley catheter placed on Sunday and does need a follow-up with [facility].  He is a regular patient of Dr. Jerilee Field with [facility]. ?

## 2021-08-29 NOTE — ED Triage Notes (Signed)
Pt from home by EMS for weakness. Pt got up to go to the bathroom and was too weak to get off toilet. Initially diaphoretic on EMS arrival, VS 117/59, pulse 63. 20g IV in R AC, NS given pta. Recent R knee replacement, had urinary retention yesterday with catheter placement. Pt also reports having a scheduled DVT study on R calm today for increased pain and swelling ?

## 2021-08-29 NOTE — Progress Notes (Signed)
Right lower extremity venous duplex completed. ?Refer to "CV Proc" under chart review to view preliminary results. ? ?Preliminary results discussed with Roswell Miners, RN and Dr. Regenia Skeeter via Secure Chat. ? ?08/29/2021 9:19 AM ?Kelby Aline., MHA, RVT, RDCS, RDMS   ?
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
## 2021-08-29 NOTE — Assessment & Plan Note (Addendum)
Foley catheter was placed recently in the emergency department for urinary retention on 4/10.   ?Patient has follow-up appointment with Dr. Mena [facility] set for April 25.   ?Home medication regimen includes Flomax 0.4 mg twice daily and Proscar 5 mg daily. ?A voiding trial was attempted by orthopedics which the patient failed.  Foley catheter was reinserted.  Continue with Flomax. ?It looks like patient has been started on cephalexin by orthopedics after discussions with urology.   ?

## 2021-08-29 NOTE — Assessment & Plan Note (Addendum)
Likely reactive.  No infectious source found.  Resolved. ?

## 2021-08-29 NOTE — Assessment & Plan Note (Addendum)
Noted to be on bisoprolol-HCTZ and Avapro prior to admission.  ?His HCTZ and Avapro were held due to elevated creatinine.  Blood pressure is reasonably well controlled. ?Continue bisoprolol only for now.   ?

## 2021-08-29 NOTE — ED Notes (Signed)
Patient transported to CT 

## 2021-08-29 NOTE — Assessment & Plan Note (Addendum)
Mild drop in hemoglobin is noted.  No overt bleeding appreciated.  Hemoglobin has been stable.  We will recheck labs tomorrow ?

## 2021-08-29 NOTE — Assessment & Plan Note (Addendum)
Renal function seems to be close to baseline.  Continue to monitor.   Monitor urine output. ?

## 2021-08-29 NOTE — Assessment & Plan Note (Addendum)
Patient had just recently undergone right total knee replacement by Dr. Ninfa Linden on 4/4.   ?Ultrasound of the right lower extremity revealed acute DVT of the right posterior tibial and peroneal veins.  CT angiogram noted small pulmonary emboli of the right upper lobe without significant concern of right heart strain.   ?Patient was initially started on heparin.  Changed over to apixaban. ?Since troponins are normal and it was a small PE there is no reason to do an echocardiogram at this time.  No evidence for right heart strain was noted on CT angiogram.   ?Patient is tolerating his anticoagulation well. ?

## 2021-08-29 NOTE — Progress Notes (Addendum)
ANTICOAGULATION CONSULT NOTE - Initial Consult ? ?Pharmacy Consult for heparin ?Indication: pulmonary embolus ? ?No Known Allergies ? ?Patient Measurements: ?  ?Heparin Dosing Weight: 88kg ? ?Vital Signs: ?Temp: 97.9 ?F (36.6 ?C) (04/11 0650) ?Temp Source: Oral (04/11 0650) ?BP: 143/71 (04/11 1000) ?Pulse Rate: 57 (04/11 1000) ? ?Labs: ?Recent Labs  ?  08/29/21 ?0704  ?HGB 11.3*  ?HCT 34.4*  ?PLT 231  ?CREATININE 1.26*  ? ? ?Estimated Creatinine Clearance: 54 mL/min (A) (by C-G formula based on SCr of 1.26 mg/dL (H)). ? ? ?Medical History: ?Past Medical History:  ?Diagnosis Date  ? BPH (benign prostatic hyperplasia)   ? Chronic kidney disease   ? kidney stones  ? Headache(784.0)   ? migraines  ? History of kidney stones   ? Hyperlipidemia   ? Hypertension   ? ? ?Assessment: ?59 YOM presenting with weakness and lightheadedness, CT angio shows small PE, he is not on anticoagulation PTA, H/H 11.3/34.4, plts wnl ? ?Goal of Therapy:  ?Heparin level 0.3-0.7 units/ml ?Monitor platelets by anticoagulation protocol: Yes ?  ?Plan:  ?Heparin 5500 units IV x 1, and gtt at 1400 units/hr ?F/u 8 hour heparin level ?F/u long term AC plan and ability to transition to PO ? ? ?Addendum: ?Now to start Eliquis ?Eliquis 10mg  BID x 7d, then 5mg  BID thereafter ? ? ? , PharmD ?Clinical Pharmacist ?ED Pharmacist Phone # [phone] ?08/29/2021 10:57 AM ? ? ? ?

## 2021-08-29 NOTE — H&P (Addendum)
?History and Physical  ? ? ?Patient: [name] [name]. EB:[id number] DOB: 11-04-1940 ?DOA: 08/29/2021 ?DOS: the patient was seen and examined on 08/29/2021 ?PCP: Lawerance Cruel, MD  ?Patient coming from: Home ? ?Chief Complaint:  ?Chief Complaint  ?Patient presents with  ? Weakness  ? ?HPI: [name] [name]. is a 81 y.o. male with medical history significant of hypertension, hyperlipidemia, CKD stage II/III, BPH, and nephrolithiasis presents with complaints of weakness.  Patient had just recently had right total knee replacement with Dr. Ninfa Linden on 4/4.  After the surgery had been placed on aspirin 81 mg twice daily.  He reports he was in the hospital until 4/7 when they removed his Foley catheter.  After getting home however he was unable to urinate and due to pain was seen in the emergency department yesterday where a Foley catheter was placed draining out 780 mL of urine.  He was recommended to follow-up with his urologist Dr. Junious Silk, but the soonest appointment was for April 25.  He notes ever since the surgery he has been having some swelling in his right leg.  Reports having constant dull pain with intermittent sharp pain in the right leg that he rates as a 7-8/10 on the pain scale.  This morning he awoke at around 4:30 AM and took a pain pill.  After getting up on the side of the bed using his walker his wife help assist him to the bathroom.  He reports having to use a pulley to lift his right leg as it does not seem to want to work.  While walking patient reported feeling weak, dizzy and disoriented as though he could pass out.  His wife followed him to the bathroom where he was able to have a bowel movement, but subsequently was not able to get up from the commode.  EMS was consulted due to feeling severely weak and lightheaded.  Denies ever losing consciousness. ? ?Upon admission into the emergency department patient was noted to be afebrile with pulse 56-63, respirations 13-22, and all other vital signs  maintained.  Labs significant for WBC 11.6, hemoglobin 11.3, BUN 23, creatinine 1.26, and calcium 8.4.  Vascular ultrasound of the lower extremities revealed a acute DVT thrombosis of the right posterior tibial and peroneal veins.  CT angiogram of the chest noted small pulmonary emboli of the right upper lobe without right heart strain.  Patient had initially been ordered heparin drip and orders placed to start Eliquis.  Urinalysis did not note any signs significant signs of infection. ? ? ?Review of Systems: As mentioned in the history of present illness. All other systems reviewed and are negative. ?Past Medical History:  ?Diagnosis Date  ? BPH (benign prostatic hyperplasia)   ? Chronic kidney disease   ? kidney stones  ? Headache(784.0)   ? migraines  ? History of kidney stones   ? Hyperlipidemia   ? Hypertension   ? ?Past Surgical History:  ?Procedure Laterality Date  ? ACHILLES TENDON SURGERY    ? APPENDECTOMY    ? BACK SURGERY    ? COLONOSCOPY    ? HERNIA REPAIR    ? INGUINAL HERNIA REPAIR Right 05/26/2013  ? Procedure: RIGHT INGUINAL HERNIA REPAIR ;  Surgeon: Joyice Faster. Cornett, MD;  Location: [place];  Service: General;  Laterality: Right;  ? INSERTION OF MESH Right 05/26/2013  ? Procedure: INSERTION OF MESH;  Surgeon: Joyice Faster. Cornett, MD;  Location: [place];  Service: General;  Laterality: Right;  ? NASAL  SEPTUM SURGERY    ? right hand surgery    ? TONSILLECTOMY    ? TOTAL KNEE ARTHROPLASTY Right 08/22/2021  ? Procedure: RIGHT TOTAL KNEE ARTHROPLASTY;  Surgeon: Mcarthur Rossetti, MD;  Location: [place];  Service: Orthopedics;  Laterality: Right;  ? ?Social History:  reports that he quit smoking about 57 years ago. His smoking use included cigarettes. He has a 5.00 pack-year smoking history. He has never used smokeless tobacco. He reports that he does not drink alcohol and does not use drugs. ? ?No Known Allergies ? ?Family History  ?Problem Relation Age of Onset  ? Diabetes Father   ? Hypertension Father   ?  Heart disease Father   ? Uterine cancer Mother   ? Hypertension Mother   ? ? ?Prior to Admission medications   ?Medication Sig Start Date End Date Taking? Authorizing Provider  ?acetaminophen (TYLENOL) 325 MG tablet Take 650 mg by mouth every 6 (six) hours as needed for moderate pain.    [provider]  ?aspirin 81 MG chewable tablet Chew 1 tablet (81 mg total) by mouth 2 (two) times daily. 08/25/21   Mcarthur Rossetti, MD  ?bisoprolol-hydrochlorothiazide [facility]) 10-6.25 MG per tablet Take 1 tablet by mouth daily.    [provider]  ?finasteride (PROSCAR) 5 MG tablet Take 5 mg by mouth daily. 05/03/21   [provider]  ?irbesartan (AVAPRO) 150 MG tablet Take 150 mg by mouth daily. 03/24/21   [provider]  ?methocarbamol (ROBAXIN) 500 MG tablet Take 1 tablet (500 mg total) by mouth every 6 (six) hours as needed for muscle spasms. 08/25/21   Mcarthur Rossetti, MD  ?naproxen sodium (ALEVE) 220 MG tablet Take 220 mg by mouth daily as needed (pain).    [provider]  ?oxyCODONE (OXY IR/ROXICODONE) 5 MG immediate release tablet Take 1-2 tablets (5-10 mg total) by mouth every 4 (four) hours as needed for moderate pain (pain score 4-6). 08/25/21   Mcarthur Rossetti, MD  ?tamsulosin (FLOMAX) 0.4 MG CAPS capsule Take by mouth.    [provider]  ? ? ?Physical Exam: ?Vitals:  ? 08/29/21 0650 08/29/21 0841 08/29/21 1000 08/29/21 1045  ?BP: 138/65 (!) 146/75 (!) 143/71 140/67  ?Pulse: 63 (!) 56 (!) 57 63  ?Resp: (!) 22 16 13 15   ?Temp: 97.9 ?F (36.6 ?C)     ?TempSrc: Oral     ?SpO2: 100% 95% 94% 92%  ? ?Exam ? ?Constitutional: Elderly male currently in no acute distress ?Eyes: PERRL, lids and conjunctivae normal ?ENMT: Mucous membranes are moist. Posterior pharynx clear of any exudate or lesions.Normal dentition.  ?Neck: normal, supple, no masses, no thyromegaly ?Respiratory: clear to auscultation bilaterally, no wheezing, no crackles. Normal respiratory  effort. No accessory muscle use.  ?Cardiovascular: Regular rate and rhythm, no murmurs / rubs / gallops.  +1 edema the right lower extremity. 2+ pedal pulses. No carotid bruits.  ?Abdomen: no tenderness, no masses palpated. No hepatosplenomegaly. Bowel sounds positive.  ?Musculoskeletal: no clubbing / cyanosis.  ?Skin: no rashes, lesions, ulcers. No induration ?Neurologic: CN 2-12 grossly intact.  Inability to dorsiflex right foot. ?Psychiatric: Normal judgment and insight. Alert and oriented x 3. Normal mood.  ? ?Data Reviewed: ? ?EKG revealed normal sinus rhythm at 62 bpm similar to previous tracing.  Reviewed labs, imaging, and pertinent records as noted above in HPI. ? 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 Ninfa Linden on 4/4.  Since the procedure he complained of right leg swelling.  This morning was noted to be lightheaded and weak unable to get out of bed.  Ultrasound of the right lower extremity revealed acute DVT of the right posterior tibial and peroneal veins.  CT angiogram noted small pulmonary emboli of the right upper lobe without significant concern of right heart strain.  Patient has been started on heparin, but orders placed to switch to Eliquis. ?-Admit to a medical telemetry bed ?-Strict bedrest x24 hours of being on anticoagulation to decrease risk of migration of DVT ?-Incentive spirometry ?-Stop aspirin 81 mg twice daily as ineffective as DVT prophylaxis ?-Continue Eliquis for likely need of 3-6 months of anticoagulation as this would be thought to be provoked by the recent surgery ?-Check echocardiogram ? ?Leukocytosis ?Acute.  WBC elevated at 11.6.  Urinalysis did not show any signs of infection.  Suspect secondary to above. ?-Recheck CBC tomorrow ? ?Chronic kidney disease, stage IIIa (moderate) (HCC) ?On admission creatinine 1.26 with BUN 23.  This  appears around patient's baseline creatinine of 1.1-1.3. ?-Continue to monitor ? ?Essential hypertension ?Blood pressures initially noted to be elevated up to 158/71. Home blood pressure regimen include bisoprol

## 2021-08-30 ENCOUNTER — Other Ambulatory Visit (HOSPITAL_COMMUNITY): Payer: Self-pay

## 2021-08-30 DIAGNOSIS — Z7982 Long term (current) use of aspirin: Secondary | ICD-10-CM | POA: Diagnosis not present

## 2021-08-30 DIAGNOSIS — N1831 Chronic kidney disease, stage 3a: Secondary | ICD-10-CM | POA: Diagnosis present

## 2021-08-30 DIAGNOSIS — I2699 Other pulmonary embolism without acute cor pulmonale: Secondary | ICD-10-CM | POA: Diagnosis present

## 2021-08-30 DIAGNOSIS — Z79899 Other long term (current) drug therapy: Secondary | ICD-10-CM | POA: Diagnosis not present

## 2021-08-30 DIAGNOSIS — R7989 Other specified abnormal findings of blood chemistry: Secondary | ICD-10-CM | POA: Diagnosis not present

## 2021-08-30 DIAGNOSIS — K8020 Calculus of gallbladder without cholecystitis without obstruction: Secondary | ICD-10-CM | POA: Diagnosis present

## 2021-08-30 DIAGNOSIS — I82441 Acute embolism and thrombosis of right tibial vein: Secondary | ICD-10-CM | POA: Diagnosis present

## 2021-08-30 DIAGNOSIS — E785 Hyperlipidemia, unspecified: Secondary | ICD-10-CM | POA: Diagnosis present

## 2021-08-30 DIAGNOSIS — Z6831 Body mass index (BMI) 31.0-31.9, adult: Secondary | ICD-10-CM | POA: Diagnosis not present

## 2021-08-30 DIAGNOSIS — Z8049 Family history of malignant neoplasm of other genital organs: Secondary | ICD-10-CM | POA: Diagnosis not present

## 2021-08-30 DIAGNOSIS — Z87442 Personal history of urinary calculi: Secondary | ICD-10-CM | POA: Diagnosis not present

## 2021-08-30 DIAGNOSIS — R338 Other retention of urine: Secondary | ICD-10-CM | POA: Diagnosis present

## 2021-08-30 DIAGNOSIS — Z87891 Personal history of nicotine dependence: Secondary | ICD-10-CM | POA: Diagnosis not present

## 2021-08-30 DIAGNOSIS — Z8249 Family history of ischemic heart disease and other diseases of the circulatory system: Secondary | ICD-10-CM | POA: Diagnosis not present

## 2021-08-30 DIAGNOSIS — I82451 Acute embolism and thrombosis of right peroneal vein: Secondary | ICD-10-CM | POA: Diagnosis present

## 2021-08-30 DIAGNOSIS — I129 Hypertensive chronic kidney disease with stage 1 through stage 4 chronic kidney disease, or unspecified chronic kidney disease: Secondary | ICD-10-CM | POA: Diagnosis present

## 2021-08-30 DIAGNOSIS — Z96651 Presence of right artificial knee joint: Secondary | ICD-10-CM | POA: Diagnosis present

## 2021-08-30 DIAGNOSIS — Z833 Family history of diabetes mellitus: Secondary | ICD-10-CM | POA: Diagnosis not present

## 2021-08-30 DIAGNOSIS — E669 Obesity, unspecified: Secondary | ICD-10-CM | POA: Diagnosis present

## 2021-08-30 DIAGNOSIS — N401 Enlarged prostate with lower urinary tract symptoms: Secondary | ICD-10-CM | POA: Diagnosis present

## 2021-08-30 DIAGNOSIS — I82461 Acute embolism and thrombosis of right calf muscular vein: Secondary | ICD-10-CM | POA: Diagnosis not present

## 2021-08-30 DIAGNOSIS — D72829 Elevated white blood cell count, unspecified: Secondary | ICD-10-CM | POA: Diagnosis present

## 2021-08-30 DIAGNOSIS — D649 Anemia, unspecified: Secondary | ICD-10-CM | POA: Diagnosis present

## 2021-08-30 DIAGNOSIS — I10 Essential (primary) hypertension: Secondary | ICD-10-CM | POA: Diagnosis not present

## 2021-08-30 LAB — COMPREHENSIVE METABOLIC PANEL
ALT: 47 U/L — ABNORMAL HIGH (ref 0–44)
AST: 36 U/L (ref 15–41)
Albumin: 2.2 g/dL — ABNORMAL LOW (ref 3.5–5.0)
Alkaline Phosphatase: 56 U/L (ref 38–126)
Anion gap: 6 (ref 5–15)
BUN: 27 mg/dL — ABNORMAL HIGH (ref 8–23)
CO2: 22 mmol/L (ref 22–32)
Calcium: 7.8 mg/dL — ABNORMAL LOW (ref 8.9–10.3)
Chloride: 109 mmol/L (ref 98–111)
Creatinine, Ser: 1.15 mg/dL (ref 0.61–1.24)
GFR, Estimated: >60 mL/min (ref >60)
Glucose, Bld: 97 mg/dL (ref 70–99)
Potassium: 3.8 mmol/L (ref 3.5–5.1)
Sodium: 137 mmol/L (ref 135–145)
Total Bilirubin: 1.3 mg/dL — ABNORMAL HIGH (ref 0.3–1.2)
Total Protein: 4.9 g/dL — ABNORMAL LOW (ref 6.5–8.1)

## 2021-08-30 LAB — CBC
HCT: 30.7 % — ABNORMAL LOW (ref 39.0–52.0)
Hemoglobin: 10 g/dL — ABNORMAL LOW (ref 13.0–17.0)
MCH: 29.4 pg (ref 26.0–34.0)
MCHC: 32.6 g/dL (ref 30.0–36.0)
MCV: 90.3 fL (ref 80.0–100.0)
Platelets: 225 10*3/uL (ref 150–400)
RBC: 3.4 MIL/uL — ABNORMAL LOW (ref 4.22–5.81)
RDW: 13.4 % (ref 11.5–15.5)
WBC: 10 10*3/uL (ref 4.0–10.5)
nRBC: 0 % (ref 0.0–0.2)

## 2021-08-30 NOTE — Assessment & Plan Note (Addendum)
Patient's pain medication dose was adjusted for uncontrolled pain.  Pain is better controlled. ?Despite some improvement in his mobility he is still unable to mobilize independently.  His wife is unable to take care of him at home.  He is not able to do his ADLs with his his current level of activity. ?Discussed with orthopedics.  They feel that patient may benefit from short-term rehab.  Referral has been placed to TOC.  Referral also placed to inpatient rehab. ?

## 2021-08-30 NOTE — Evaluation (Addendum)
Physical Therapy Evaluation ?Patient Details ?Name: [name] [name]. ?MRN: [id number] ?DOB: 12/31/1940 ?Today's Date: 08/30/2021 ? ?History of Present Illness ? [name] [name] is a 81 y.o. male presenting to [facility] ED 08/29/21 with severe calf pain, LE weakness, and dizziness. Imaging (+) for small acute PE in R upper lobe and DVT in R calf. 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 PMH includes HTN, CKD, nephrolithiasis, and back surgery.  ?Clinical Impression ? Patient presented to the ED on 08/29/21 with severe calf pain, LE weakness, and dizziness consistent with patient's diagnosis of acute PE in R upper lobe and RLE DVT s/p R TKA (08/22/21). Pt's impairments include decreased range of motion, strength, balance, pain modulation, and activity tolerance. These impairments are limiting his ability to safely and independently transfer, ambulate, and navigate stairs. Patient requires min A with bed mobility and mod A with transfers using RW. Pt with R foot drop, calf pain, and inability to weight bear through RLE. Pt educated on continuing therex for pain modulation and to increase blood circulation through RLE. If pt does not progress well, may need to consider SNF upon D/C. PT will continue to follow acutely to maximize pt's safety and independence with functional mobility. ?   ?   ? ?Recommendations for follow up therapy are one component of a multi-disciplinary discharge planning process, led by the attending physician.  Recommendations may be updated based on patient status, additional functional criteria and insurance authorization. ? ?Follow Up Recommendations Follow physician's recommendations for discharge plan and follow up therapies (if pt doesn't progress well, may need SNF) ? ?  ?Assistance Recommended at Discharge Frequent or constant Supervision/Assistance  ?Patient can return home with the following ? A lot of help with walking and/or transfers;A lot of help with bathing/dressing/bathroom;Assistance with  cooking/housework;Assist for transportation;Help with stairs or ramp for entrance ? ?  ?Equipment Recommendations None recommended by PT  ?Recommendations for Other Services ?    ?  ?Functional Status Assessment Patient has had a recent decline in their functional status and/or demonstrates limited ability to make significant improvements in function in a reasonable and predictable amount of time  ? ?  ?Precautions / Restrictions Precautions ?Precautions: Knee ?Precaution Booklet Issued: No ?Required Braces or Orthoses: Other Brace ?Other Brace: R AFO ?Restrictions ?Weight Bearing Restrictions: Yes ?RLE Weight Bearing: Weight bearing as tolerated  ? ?  ? ?Mobility ? Bed Mobility ?Overal bed mobility: Needs Assistance ?Bed Mobility: Supine to Sit ?  ?  ?Supine to sit: Min assist ?  ?  ?General bed mobility comments: pt required min A for trunk elevation to sitting at EOB. Pt cued not to use strap to help move his RLE. ?  ? ?Transfers ?Overall transfer level: Needs assistance ?Equipment used: Rolling walker (2 wheels) ?Transfers: Sit to/from Stand ?Sit to Stand: Mod assist ?Stand pivot transfers: Mod assist ?  ?  ?  ?  ?General transfer comment: pt required mod A for transfers for power up and steadying with RW. Pt unable to bend R knee with transfer and has 6/10 calf pain in standing position without placing weight through RLE. Due to pt pain and unsteadiness with transfers, further mobility deferred for safety. ?  ? ?Ambulation/Gait ?  ?  ?  ?  ?  ?  ?  ?  ? ?Stairs ?  ?  ?  ?  ?  ? ?Wheelchair Mobility ?  ? ?Modified Rankin (Stroke Patients Only) ?  ? ?  ? ?  Balance Overall balance assessment: Needs assistance ?Sitting-balance support: No upper extremity supported, Feet supported ?Sitting balance-Leahy Scale: Fair ?Sitting balance - Comments: requires no UE support in sitting, however, unable to maintain balance with weightshifts without support. Pt supervision in sitting. ?  ?Standing balance support: Bilateral  upper extremity supported, During functional activity, Reliant on assistive device for balance ?Standing balance-Leahy Scale: Poor ?Standing balance comment: Reliant on BUE with RW with mod A for standing ?  ?  ?  ?  ?  ?  ?  ?  ?  ?  ?  ?   ? ? ? ?Pertinent Vitals/Pain Pain Assessment ?Pain Assessment: 0-10 ?Pain Score: 7  ?Pain Location: R calf - DVT location ?Pain Descriptors / Indicators: Grimacing, Discomfort ?Pain Intervention(s): Limited activity within patient's tolerance, Monitored during session  ? ? ?Home Living Family/patient expects to be discharged to:: Private residence ?Living Arrangements: Spouse/significant other ?Available Help at Discharge: Family ?Type of Home: House ?Home Access: Stairs to enter ?Entrance Stairs-Rails: Right ?Entrance Stairs-Number of Steps: 5 ?  ?Home Layout: One level ?Home Equipment: Rolling Walker (2 wheels);BSC/3in1;Shower seat ?   ?  ?Prior Function Prior Level of Function : Needs assist ?  ?  ?  ?  ?  ?  ?Mobility Comments: uses RW following R TKA. ?ADLs Comments: independent ?  ? ? ?Hand Dominance  ?   ? ?  ?Extremity/Trunk Assessment  ? Upper Extremity Assessment ?Upper Extremity Assessment: Overall WFL for tasks assessed ?  ? ?Lower Extremity Assessment ?Lower Extremity Assessment: RLE deficits/detail ?RLE Deficits / Details: R foot drop present with no DF muscle activation with AROM. s/p R TKA with decreased knee ROM and increased stiffness. ?  ? ?Cervical / Trunk Assessment ?Cervical / Trunk Assessment: Normal  ?Communication  ? Communication: HOH  ?Cognition Arousal/Alertness: Awake/alert ?Behavior During Therapy: [facility] for tasks assessed/performed ?Overall Cognitive Status: Within Functional Limits for tasks assessed ?  ?  ?  ?  ?  ?  ?  ?  ?  ?  ?  ?  ?  ?  ?  ?  ?  ?  ?  ? ?  ?General Comments   ? ?  ?Exercises Total Joint Exercises ?Ankle Circles/Pumps: AAROM, Right, 10 reps, Seated ?Quad Sets: 10 reps, Right ?Knee Flexion: Right, Seated, Other (comment) (3 reps  with minimal range of motion and pt grimacing and straining)  ? ?Assessment/Plan  ?  ?PT Assessment Patient needs continued PT services  ?PT Problem List Decreased strength;Decreased range of motion;Decreased activity tolerance;Decreased balance;Decreased mobility;Pain ? ?   ?  ?PT Treatment Interventions DME instruction;Gait training;Stair training;Functional mobility training;Balance training;Therapeutic exercise;Therapeutic activities;Patient/family education;Neuromuscular re-education   ? ?PT Goals (Current goals can be found in the Care Plan section)  ?Acute Rehab PT Goals ?Patient Stated Goal: to get better ?PT Goal Formulation: With patient ?Time For Goal Achievement: 09/13/21 ?Potential to Achieve Goals: Good ? ?  ?Frequency Min 3X/week ?  ? ? ?Co-evaluation   ?  ?  ?  ?  ? ? ?  ?AM-PAC PT "6 Clicks" Mobility  ?Outcome Measure Help needed turning from your back to your side while in a flat bed without using bedrails?: None ?Help needed moving from lying on your back to sitting on the side of a flat bed without using bedrails?: A Lot ?Help needed moving to and from a bed to a chair (including a wheelchair)?: A Lot ?Help needed standing up from a chair using your arms (e.g., wheelchair  or bedside chair)?: A Lot ?Help needed to walk in hospital room?: A Lot ?Help needed climbing 3-5 steps with a railing? : Total ?6 Click Score: 13 ? ?  ?End of Session Equipment Utilized During Treatment: Gait belt ?Activity Tolerance: Patient limited by pain ?Patient left: in chair;with call bell/phone within reach;with chair alarm set ?Nurse Communication: Mobility status ?PT Visit Diagnosis: Other abnormalities of gait and mobility (R26.89);Pain;Unsteadiness on feet (R26.81);Muscle weakness (generalized) (M62.81);Difficulty in walking, not elsewhere classified (R26.2) ?Pain - Right/Left: Right ?Pain - part of body: Leg ?  ? ?Time: [id number] ?PT Time Calculation (min) (ACUTE ONLY): 35 min ? ? ?Charges:   PT Evaluation ?$PT  Eval Moderate Complexity: 1 Mod ?PT Treatments ?$Therapeutic Activity: 8-22 mins ?  ?   ? ? ?Jonne Ply, SPT ? ?Jonne Ply ?08/30/2021, 3:12 PM ? ?

## 2021-08-30 NOTE — ED Notes (Signed)
Alert, NAD, calm, interactive, VSS, up to floor via stretcher.  ?

## 2021-08-30 NOTE — Assessment & Plan Note (Signed)
See under acute PE. ?

## 2021-08-30 NOTE — ED Notes (Signed)
Pt alert, NAD, calm, interactive, resps e/u, speaking in clear complete sentences. C/o R knee/leg pain. Denies CP or sob.  ?

## 2021-08-30 NOTE — TOC Benefit Eligibility Note (Signed)
Patient Advocate Encounter  Insurance verification completed.    The patient is currently admitted and upon discharge could be taking Eliquis 5 mg.  The current 30 day co-pay is, $47.00.   The patient is insured through [employer] Medicare Part D    Anaiza Behrens, CPhT Pharmacy Patient Advocate Specialist [facility] Patient Advocate Team Direct Number: [phone]  Fax: [phone]        

## 2021-08-30 NOTE — Assessment & Plan Note (Signed)
Incidentally noted on CT scan.  Asymptomatic.  LFTs reviewed. ?

## 2021-08-30 NOTE — Progress Notes (Signed)
PT Cancellation Note ? ?Patient Details ?Name: [name] [name]. ?MRN: [id number] ?DOB: Mar 20, 1941 ? ? ?Cancelled Treatment:    Reason Eval/Treat Not Completed: Active bedrest order. Per MD note, pt on strict bed rest for 24 hours after initial dose of anti coagulation. PT will follow up as schedule allows and after bed rest orders are discontinued. ? ?Jonne Ply, SPT  ? ?Jonne Ply ?08/30/2021, 8:07 AM ? ? ?

## 2021-08-30 NOTE — Hospital Course (Signed)
81 y.o. male with medical history significant of hypertension, hyperlipidemia, CKD stage II/III, BPH, and nephrolithiasis presented with complaints of weakness and worsening pain in his right lower extremity.  Patient recently had right total knee replacement with Dr. Ninfa Linden on 4/4.  After the surgery had been placed on aspirin 81 mg twice daily.  He reports he was in the hospital until 4/7 when they removed his Foley catheter.  After getting home however he was unable to urinate and due to pain was seen in the emergency department yesterday where a Foley catheter was placed draining out 780 mL of urine.  He was recommended to follow-up with his urologist Dr. Junious Silk, but the soonest appointment was for April 25.  He notes ever since the surgery he has been having some swelling in his right leg.  Has been experiencing a lot of pain in the right leg which worsened.  Has not been able to do his activities of daily living at home without assistance.  Got dizzy after he went to the bathroom.  EMS was called by his wife.  Evaluation revealed that patient had a DVT in the right lower extremity and CT angiogram showed a small pulmonary embolus.  Patient was hospitalized for further management. ?

## 2021-08-30 NOTE — Progress Notes (Signed)
? ?[facility] ?PROGRESS NOTE ? ? ?[name]. [id number] DOB: 1941/03/21 DOA: 08/29/2021  0 ?DOS: the patient was seen and examined on 08/30/2021 ? ?PCP: Lawerance Cruel, MD ? ?Brief History and Hospital Course:  ?81 y.o. male with medical history significant of hypertension, hyperlipidemia, CKD stage II/III, BPH, and nephrolithiasis presented with complaints of weakness and worsening pain in his right lower extremity.  Patient recently had right total knee replacement with Dr. Ninfa Linden on 4/4.  After the surgery had been placed on aspirin 81 mg twice daily.  He reports he was in the hospital until 4/7 when they removed his Foley catheter.  After getting home however he was unable to urinate and due to pain was seen in the emergency department yesterday where a Foley catheter was placed draining out 780 mL of urine.  He was recommended to follow-up with his urologist Dr. Junious Silk, but the soonest appointment was for April 25.  He notes ever since the surgery he has been having some swelling in his right leg.  Has been experiencing a lot of pain in the right leg which worsened.  Has not been able to do his activities of daily living at home without assistance.  Got dizzy after he went to the bathroom.  EMS was called by his wife.  Evaluation revealed that patient had a DVT in the right lower extremity and CT angiogram showed a small pulmonary embolus.  Patient was hospitalized for further management. ? ?Consultants: None ? ?Procedures: None ? ? ? ?Subjective: ?Patient complains of significant pain in the right lower extremity and inability to mobilize himself. ? ? ? ?Assessment/Plan: ? ?* Acute pulmonary embolus and right leg DVT status post total right knee replacement ?Patient had just recently undergone right total knee replacement by Dr. Ninfa Linden on 4/4.   ?Ultrasound of the right lower extremity revealed acute DVT of the right posterior tibial and peroneal veins.  CT angiogram noted small  pulmonary emboli of the right upper lobe without significant concern of right heart strain.   ?Patient was initially started on heparin.  Changed over to apixaban. ?Continues to have significant discomfort in the right lower extremity.  ?Since troponins are normal and it was a small PE there is no reason to do an echocardiogram at this time.  No evidence for right heart strain was noted on CT angiogram.   ?Continue with apixaban for now.  PT evaluation.  Patient mentioned that he lives with his wife but his wife is unable to care for him if he requires significant assistance. ? ?Chronic kidney disease, stage IIIa (moderate) (HCC) ?Renal function seems to be close to baseline.  Continue to monitor.   ? ?Essential hypertension ?Continued on bisoprolol-HCTZ, Avapro.  Monitor blood pressures.  Pain likely contributing to elevated readings.   ? ?Leukocytosis ?Likely reactive.  Normal today.  No infectious source found.   ? ?BPH with urinary obstruction ?Foley catheter was placed recently in the emergency department for urinary retention on 4/10.   ?Patient has follow-up appointment with Dr. Junious [facility]k [facility] set for April 25.   ?Home medication regimen includes Flomax 0.4 mg twice daily and Proscar 5 mg daily. ?Continue Foley catheter. ? ?Normocytic anemia ?Mild drop in hemoglobin is noted.  No overt bleeding appreciated.  Continue to monitor for now.   ? ?Cholelithiasis ?Incidentally noted on CT scan.  Asymptomatic.  LFTs reviewed. ? ?Right leg DVT ([place]) ?See under acute PE. ? ?Status post total knee replacement, right ?PT  and OT evaluation.  Fall. ? ? ?Obesity ?Estimated body mass index is 31.78 kg/m? as calculated from the following: ?  Height as of this encounter: 5\' 8"  (1.727 m). ?  Weight as of this encounter: 94.8 kg. ? ? ?DVT Prophylaxis: Apixaban ?Code Status: Full code ?Family Communication: Discussed with patient ?Disposition Plan: To be determined ? ?Status is: Observation ?The patient will require  care spanning > 2 midnights and should be moved to inpatient because: Significant pain, inability to ambulate, high fall risk ? ? ? ? ?Medications: Scheduled: ? apixaban  10 mg Oral BID  ? Followed by  ? [START ON 09/05/2021] apixaban  5 mg Oral BID  ? bisoprolol-hydrochlorothiazide  1 tablet Oral Daily  ? finasteride  5 mg Oral Daily  ? irbesartan  150 mg Oral Daily  ? sodium chloride flush  3 mL Intravenous Q12H  ? tamsulosin  0.4 mg Oral BID  ? ?Continuous: ?[id number] **OR** acetaminophen, albuterol, methocarbamol, ondansetron **OR** ondansetron (ZOFRAN) IV, oxyCODONE ? ?Antibiotics: ?Anti-infectives (From admission, onward)  ? ? None  ? ?  ? ? ?Objective: ? ?Vital Signs ? ?Vitals:  ? 08/30/21 0300 08/30/21 0400 08/30/21 0800 08/30/21 0845  ?BP: 135/64 (!) 160/81  (!) 144/67  ?Pulse: (!) 58 (!) 58 66 64  ?Resp: 17 17  16   ?Temp:      ?TempSrc:      ?SpO2: 90% 92% 96% 98%  ?Weight:      ?Height:      ? ? ?Intake/Output Summary (Last 24 hours) at 08/30/2021 0924 ?Last data filed at 08/29/2021 1443 ?Gross per 24 hour  ?Intake 1100 ml  ?Output --  ?Net 1100 ml  ? ?Filed Weights  ? 08/29/21 1210  ?Weight: 94.8 kg  ? ? ?General appearance: Awake alert.  In no distress ?Resp: Clear to auscultation bilaterally.  Normal effort ?Cardio: S1-S2 is normal regular.  No S3-S4.  No rubs murmurs or bruit ?GI: Abdomen is soft.  Nontender nondistended.  Bowel sounds are present normal.  No masses organomegaly ?Extremities: Swollen right lower extremity noted.  Very limited range of motion.  From his recent surgery and blood clot.  Physical deconditioning noted. ?Neurologic: Alert and oriented x3.  No focal neurological deficits.  ? ? ?Lab Results: ? ?Data Reviewed: I have personally reviewed labs and imaging study reports ? ?CBC: ?Recent Labs  ?Lab 08/29/21 ?0704 08/30/21 ?0421  ?WBC 11.6* 10.0  ?HGB 11.3* 10.0*  ?HCT 34.4* 30.7*  ?MCV 90.8 90.3  ?PLT 231 225  ? ? ?Basic Metabolic Panel: ?Recent Labs  ?Lab 08/29/21 ?0704  08/30/21 ?0421  ?NA 135 137  ?K 4.5 3.8  ?CL 104 109  ?CO2 23 22  ?GLUCOSE 123* 97  ?BUN 23 27*  ?CREATININE 1.26* 1.15  ?CALCIUM 8.4* 7.8*  ? ? ?GFR: ?Estimated Creatinine Clearance: 59.2 mL/min (by C-G formula based on SCr of 1.15 mg/dL). ? ?Liver Function Tests: ?Recent Labs  ?Lab 08/30/21 ?0421  ?AST 36  ?ALT 47*  ?ALKPHOS 56  ?BILITOT 1.3*  ?PROT 4.9*  ?ALBUMIN 2.2*  ? ? ? ?Radiology Studies: ?CT Angio Chest PE W and/or Wo Contrast ? ?Result Date: 08/29/2021 ?CLINICAL DATA:  RIGHT calf pain and swelling, recent surgery, weakness, high clinical suspicion of pulmonary embolism EXAM: CT ANGIOGRAPHY CHEST WITH CONTRAST TECHNIQUE: Multidetector CT imaging of the chest was performed using the standard protocol during bolus administration of intravenous contrast. Multiplanar CT image reconstructions and MIPs were obtained to evaluate the vascular anatomy. RADIATION DOSE REDUCTION:  This exam was performed according to the departmental dose-optimization program which includes automated exposure control, adjustment of the mA and/or kV according to patient size and/or use of iterative reconstruction technique. CONTRAST:  77mL OMNIPAQUE IOHEXOL 350 MG/ML SOLN IV COMPARISON:  None FINDINGS: Cardiovascular: Aorta normal caliber. Heart size normal. No pericardial effusion. Pulmonary arteries adequately opacified. Pulmonary emboli identified within RIGHT upper lobe. Probable small embolus in RIGHT lower lobe pulmonary artery. Questionable tiny embolus LEFT upper lobe. No additional emboli. Mediastinum/Nodes: Esophagus unremarkable. Base of cervical region normal appearance. No thoracic adenopathy. Lungs/Pleura: Subsegmental atelectasis RIGHT lower lobe. Minimal atelectasis LEFT lower lobe. No pulmonary infiltrate, pleural effusion, or pneumothorax. Significant elevation of RIGHT diaphragm. Upper Abdomen: Cholelithiasis. Remaining visualized upper abdomen unremarkable. Musculoskeletal: No acute osseous findings. Review of the  MIP images confirms the above findings. IMPRESSION: Small pulmonary emboli greatest in RIGHT upper lobe. Cholelithiasis. Findings called to Dr.  Regenia Skeeter on 08/29/2021 at 1043 hrs. Electronically Signed   By: Feliz Beam

## 2021-08-31 ENCOUNTER — Telehealth: Payer: Self-pay | Admitting: *Deleted

## 2021-08-31 DIAGNOSIS — I10 Essential (primary) hypertension: Secondary | ICD-10-CM | POA: Diagnosis not present

## 2021-08-31 DIAGNOSIS — D649 Anemia, unspecified: Secondary | ICD-10-CM | POA: Diagnosis not present

## 2021-08-31 DIAGNOSIS — I82461 Acute embolism and thrombosis of right calf muscular vein: Secondary | ICD-10-CM | POA: Diagnosis not present

## 2021-08-31 DIAGNOSIS — I2699 Other pulmonary embolism without acute cor pulmonale: Secondary | ICD-10-CM | POA: Diagnosis not present

## 2021-08-31 LAB — COMPREHENSIVE METABOLIC PANEL
ALT: 53 U/L — ABNORMAL HIGH (ref 0–44)
AST: 34 U/L (ref 15–41)
Albumin: 2.3 g/dL — ABNORMAL LOW (ref 3.5–5.0)
Alkaline Phosphatase: 66 U/L (ref 38–126)
Anion gap: 6 (ref 5–15)
BUN: 23 mg/dL (ref 8–23)
CO2: 24 mmol/L (ref 22–32)
Calcium: 8.1 mg/dL — ABNORMAL LOW (ref 8.9–10.3)
Chloride: 103 mmol/L (ref 98–111)
Creatinine, Ser: 1.12 mg/dL (ref 0.61–1.24)
GFR, Estimated: >60 mL/min (ref >60)
Glucose, Bld: 102 mg/dL — ABNORMAL HIGH (ref 70–99)
Potassium: 4.3 mmol/L (ref 3.5–5.1)
Sodium: 133 mmol/L — ABNORMAL LOW (ref 135–145)
Total Bilirubin: 1.2 mg/dL (ref 0.3–1.2)
Total Protein: 5.3 g/dL — ABNORMAL LOW (ref 6.5–8.1)

## 2021-08-31 LAB — CBC
HCT: 30.6 % — ABNORMAL LOW (ref 39.0–52.0)
Hemoglobin: 10.3 g/dL — ABNORMAL LOW (ref 13.0–17.0)
MCH: 29.8 pg (ref 26.0–34.0)
MCHC: 33.7 g/dL (ref 30.0–36.0)
MCV: 88.4 fL (ref 80.0–100.0)
Platelets: 238 10*3/uL (ref 150–400)
RBC: 3.46 MIL/uL — ABNORMAL LOW (ref 4.22–5.81)
RDW: 13.3 % (ref 11.5–15.5)
WBC: 10.6 10*3/uL — ABNORMAL HIGH (ref 4.0–10.5)
nRBC: 0 % (ref 0.0–0.2)

## 2021-08-31 MED ORDER — POLYETHYLENE GLYCOL 3350 17 G PO PACK
17.0000 g | PACK | Freq: Every day | ORAL | Status: DC
  Administered 2021-08-31 – 2021-09-02 (×3): 17 g via ORAL
  Filled 2021-08-31 (×6): qty 1

## 2021-08-31 MED ORDER — SENNOSIDES-DOCUSATE SODIUM 8.6-50 MG PO TABS
2.0000 | ORAL_TABLET | Freq: Two times a day (BID) | ORAL | Status: DC
  Administered 2021-08-31 – 2021-09-04 (×9): 2 via ORAL
  Filled 2021-08-31 (×11): qty 2

## 2021-08-31 NOTE — Progress Notes (Addendum)
? ?[facility] ?PROGRESS NOTE ? ? ?R Delray Alt. TRZ:[id number] DOB: [birth year] DOA: 08/29/2021  1 ?DOS: the patient was seen and examined on 08/31/2021 ? ?PCP: Daisy Floro, MD ? ?Brief History and Hospital Course:  ?81 y.o. male with medical history significant of hypertension, hyperlipidemia, CKD stage II/III, BPH, and nephrolithiasis presented with complaints of weakness and worsening pain in his right lower extremity.  Patient recently had right total knee replacement with Dr. Magnus Ivan on 4/4.  After the surgery had been placed on aspirin 81 mg twice daily.  He reports he was in the hospital until 4/7 when they removed his Foley catheter.  After getting home however he was unable to urinate and due to pain was seen in the emergency department yesterday where a Foley catheter was placed draining out 780 mL of urine.  He was recommended to follow-up with his urologist Dr. Mena Goes, but the soonest appointment was for April 25.  He notes ever since the surgery he has been having some swelling in his right leg.  Has been experiencing a lot of pain in the right leg which worsened.  Has not been able to do his activities of daily living at home without assistance.  Got dizzy after he went to the bathroom.  EMS was called by his wife.  Evaluation revealed that patient had a DVT in the right lower extremity and CT angiogram showed a small pulmonary embolus.  Patient was hospitalized for further management. ? ?Consultants: None ? ?Procedures: None ? ? ? ?Subjective: ?Patient mentions that pain in the right lower extremity is better than yesterday.  Able to move it better than yesterday.  No chest pain shortness of breath. ? ? ? ?Assessment/Plan: ? ?* Acute pulmonary embolus and right leg DVT status post total right knee replacement ?Patient had just recently undergone right total knee replacement by Dr. Magnus Ivan on 4/4.   ?Ultrasound of the right lower extremity revealed acute DVT of the right posterior  tibial and peroneal veins.  CT angiogram noted small pulmonary emboli of the right upper lobe without significant concern of right heart strain.   ?Patient was initially started on heparin.  Changed over to apixaban. ?Since troponins are normal and it was a small PE there is no reason to do an echocardiogram at this time.  No evidence for right heart strain was noted on CT angiogram.   ?Patient was experiencing a lot of pain yesterday.  Pain medication doses was adjusted. ?Underwent PT evaluation.  Could not do a whole lot with them due to significant pain issues.  Patient does feel better this morning.  He wants to work with physical therapy before deciding next steps.  Hesitant about going to skilled nursing facility.  Would still like to go home but not sure if he can do that today or not. ?Patient mentioned that he lives with his wife but his wife is unable to care for him if he requires significant assistance. ? ?Chronic kidney disease, stage IIIa (moderate) (HCC) ?Renal function seems to be close to baseline.  Continue to monitor.   ? ?Essential hypertension ?Continued on bisoprolol-HCTZ, Avapro.  Blood pressure is reasonably well controlled.  Continue to monitor. ? ?Leukocytosis ?Likely reactive.  No infectious source found.   ? ?BPH with urinary obstruction ?Foley catheter was placed recently in the emergency department for urinary retention on 4/10.   ?Patient has follow-up appointment with Dr. Mena [facility] set for April 25.   ?Home medication regimen includes  Flomax 0.4 mg twice daily and Proscar 5 mg daily. ?Continue Foley catheter. ? ?Normocytic anemia ?Mild drop in hemoglobin is noted.  No overt bleeding appreciated.  Stable hemoglobin. ? ?Cholelithiasis ?Incidentally noted on CT scan.  Asymptomatic.  LFTs reviewed. ? ?Right leg DVT ([place]) ?See under acute PE. ? ?Status post total knee replacement, right ?PT and OT evaluation.  ? ? ?Obesity ?Estimated body mass index is 31.78 kg/m? as  calculated from the following: ?  Height as of this encounter: 5\' 8"  (1.727 m). ?  Weight as of this encounter: 94.8 kg. ? ? ?DVT Prophylaxis: Apixaban ?Code Status: Full code ?Family Communication: Discussed with patient ?Disposition Plan: To be determined ? ?Status is: Inpatient ?Remains inpatient appropriate because: Acute PE and DVT, significant pain in the right lower extremity limiting mobility ? ? ? ? ? ? ?Medications: Scheduled: ? apixaban  10 mg Oral BID  ? Followed by  ? [START ON 09/05/2021] apixaban  5 mg Oral BID  ? bisoprolol-hydrochlorothiazide  1 tablet Oral Daily  ? finasteride  5 mg Oral Daily  ? irbesartan  150 mg Oral Daily  ? sodium chloride flush  3 mL Intravenous Q12H  ? tamsulosin  0.4 mg Oral BID  ? ?Continuous: ?[id number] **OR** acetaminophen, albuterol, methocarbamol, ondansetron **OR** ondansetron (ZOFRAN) IV, oxyCODONE ? ?Antibiotics: ?Anti-infectives (From admission, onward)  ? ? None  ? ?  ? ? ?Objective: ? ?Vital Signs ? ?Vitals:  ? 08/30/21 1516 08/30/21 1948 08/31/21 0545 08/31/21 0921  ?BP: (!) 151/66 (!) 145/71 138/75 132/74  ?Pulse: (!) 59 64  69  ?Resp: 17 18  17   ?Temp: 98.5 ?F (36.9 ?C) 98.2 ?F (36.8 ?C) 98 ?F (36.7 ?C) 98.6 ?F (37 ?C)  ?TempSrc: Oral Oral Oral Oral  ?SpO2: 95% 98% 98% 96%  ?Weight:      ?Height:      ? ? ?Intake/Output Summary (Last 24 hours) at 08/31/2021 0945 ?Last data filed at 08/30/2021 1113 ?Gross per 24 hour  ?Intake --  ?Output 2000 ml  ?Net -2000 ml  ? ? ?Filed Weights  ? 08/29/21 1210  ?Weight: 94.8 kg  ? ? ?General appearance: Awake alert.  In no distress ?Resp: Clear to auscultation bilaterally.  Normal effort ?Cardio: S1-S2 is normal regular.  No S3-S4.  No rubs murmurs or bruit ?GI: Abdomen is soft.  Nontender nondistended.  Bowel sounds are present normal.  No masses organomegaly ?Extremities: Swelling of the right lower extremity noted.  Slightly better range of motion today compared to yesterday. ?Neurologic: Alert and oriented x3.  No  focal neurological deficits.  ? ? ? ?Lab Results: ? ?Data Reviewed: I have personally reviewed labs and imaging study reports ? ?CBC: ?Recent Labs  ?Lab 08/29/21 ?0704 08/30/21 ?0421 08/31/21 ?0151  ?WBC 11.6* 10.0 10.6*  ?HGB 11.3* 10.0* 10.3*  ?HCT 34.4* 30.7* 30.6*  ?MCV 90.8 90.3 88.4  ?PLT 231 225 238  ? ? ? ?Basic Metabolic Panel: ?Recent Labs  ?Lab 08/29/21 ?0704 08/30/21 ?0421 08/31/21 ?0151  ?NA 135 137 133*  ?K 4.5 3.8 4.3  ?CL 104 109 103  ?CO2 23 22 24   ?GLUCOSE 123* 97 102*  ?BUN 23 27* 23  ?CREATININE 1.26* 1.15 1.12  ?CALCIUM 8.4* 7.8* 8.1*  ? ? ? ?GFR: ?Estimated Creatinine Clearance: 60.7 mL/min (by C-G formula based on SCr of 1.12 mg/dL). ? ?Liver Function Tests: ?Recent Labs  ?Lab 08/30/21 ?0421 08/31/21 ?0151  ?AST 36 34  ?ALT 47* 53*  ?ALKPHOS 56 66  ?  BILITOT 1.3* 1.2  ?PROT 4.9* 5.3*  ?ALBUMIN 2.2* 2.3*  ? ? ? ? ?Radiology Studies: ?CT Angio Chest PE W and/or Wo Contrast ? ?Result Date: 08/29/2021 ?CLINICAL DATA:  RIGHT calf pain and swelling, recent surgery, weakness, high clinical suspicion of pulmonary embolism EXAM: CT ANGIOGRAPHY CHEST WITH CONTRAST TECHNIQUE: Multidetector CT imaging of the chest was performed using the standard protocol during bolus administration of intravenous contrast. Multiplanar CT image reconstructions and MIPs were obtained to evaluate the vascular anatomy. RADIATION DOSE REDUCTION: This exam was performed according to the departmental dose-optimization program which includes automated exposure control, adjustment of the mA and/or kV according to patient size and/or use of iterative reconstruction technique. CONTRAST:  36mL OMNIPAQUE IOHEXOL 350 MG/ML SOLN IV COMPARISON:  None FINDINGS: Cardiovascular: Aorta normal caliber. Heart size normal. No pericardial effusion. Pulmonary arteries adequately opacified. Pulmonary emboli identified within RIGHT upper lobe. Probable small embolus in RIGHT lower lobe pulmonary artery. Questionable tiny embolus LEFT upper lobe. No  additional emboli. Mediastinum/Nodes: Esophagus unremarkable. Base of cervical region normal appearance. No thoracic adenopathy. Lungs/Pleura: Subsegmental atelectasis RIGHT lower lobe. Minimal atelectasis LEFT

## 2021-08-31 NOTE — Progress Notes (Signed)
Patient ID: [name] [name]., male   DOB: Dec 21, 1940, 81 y.o.   MRN: [id number] ?I spoke to the patient's urologist who is out of town.  The patient is already on Flomax.  The thoughts are that we should discontinue his Foley today while he is in the hospital and give him a voiding trial today.  He is already on Flomax.  Therapy is working on his mobility. ?

## 2021-08-31 NOTE — Evaluation (Signed)
Occupational Therapy Evaluation ?Patient Details ?Name: [name] HatchetR Notch Jr. ?MRN: [id number] ?DOB: 1941/01/31 ?Today's Date: 08/31/2021 ? ? ?History of Present Illness [name] [name] is a 81 y.o. male [facility] with severe calf pain, LE weakness, and dizziness. Imaging (+) for small acute PE in R upper lobe and DVT in R calf. 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 PMH includes HTN, CKD, nephrolithiasis, and back surgery.  ? ?Clinical Impression ?  ?Prior to TKA last week, pt was functioning independently. He has done little ADLs since discharge home and has been walking with a RW and AFO ordered through [employer]. Pt presents with severe R knee pain which responded well to muscle relaxer and icing at beginning of session. Pt requires set up to mod assist for ADL. He requires moderate assistance to stand and min to mod assist for mobility. Pt placing minimal weight through R LE, he reports this has been the case since his TKA. Pt was instructed in use of AE during last hospitalization. Will focus on toilet transfers, standing grooming and shower transfers for home.  ?   ? ?Recommendations for follow up therapy are one component of a multi-disciplinary discharge planning process, led by the attending physician.  Recommendations may be updated based on patient status, additional functional criteria and insurance authorization.  ? ?Follow Up Recommendations ? Follow physician's recommendations for discharge plan and follow up therapies  ?  ?Assistance Recommended at Discharge Frequent or constant Supervision/Assistance  ?Patient can return home with the following A lot of help with walking and/or transfers;A lot of help with bathing/dressing/bathroom;Assistance with cooking/housework;Assist for transportation;Help with stairs or ramp for entrance ? ?  ?Functional Status Assessment ? Patient has had a recent decline in their functional status and demonstrates the ability to make significant improvements in function  in a reasonable and predictable amount of time.  ?Equipment Recommendations ? None recommended by OT;Other (comment)  ?  ?Recommendations for Other Services   ? ? ?  ?Precautions / Restrictions Precautions ?Precautions: Knee;Fall ?Precaution Booklet Issued: No ?Precaution Comments: verbally reviewed no pillow under knee ?Required Braces or Orthoses: Other Brace ?Other Brace: missing a piece from his AFO, [employer] notified on 08/31/21 ?Restrictions ?Weight Bearing Restrictions: Yes ?RLE Weight Bearing: Weight bearing as tolerated  ? ?  ? ?Mobility Bed Mobility ?  ?  ?  ?  ?  ?  ?  ?General bed mobility comments: pt in chair ?  ? ?Transfers ?Overall transfer level: Needs assistance ?Equipment used: Rolling walker (2 wheels) ?Transfers: Sit to/from Stand ?Sit to Stand: Mod assist ?  ?  ?  ?  ?  ?General transfer comment: assist to rise and steady, pt not putting weight through R LE due to pain ?  ? ?  ?Balance Overall balance assessment: Needs assistance ?  ?Sitting balance-Leahy Scale: Fair ?  ?  ?Standing balance support: Bilateral upper extremity supported, During functional activity, Reliant on assistive device for balance ?Standing balance-Leahy Scale: Poor ?Standing balance comment: Reliant on BUE with RW with mod A for standing,unable to release walker ?  ?  ?  ?  ?  ?  ?  ?  ?  ?  ?  ?   ? ?ADL either performed or assessed with clinical judgement  ? ?ADL Overall ADL's : Needs assistance/impaired ?Eating/Feeding: Independent;Sitting ?  ?Grooming: Oral care;Sitting;Set up ?  ?Upper Body Bathing: Set up;Sitting ?  ?Lower Body Bathing: Moderate assistance;Sit to/from stand ?  ?Upper  Body Dressing : Set up;Sitting ?  ?Lower Body Dressing: Moderate assistance;Sitting/lateral leans;Sit to/from stand ?  ?  ?  ?  ?  ?  ?  ?  ?General ADL Comments: pt has been educated in use of AE last admission  ? ? ? ?Vision Baseline Vision/History: 1 Wears glasses ?Ability to See in Adequate Light: 0 Adequate ?Patient Visual Report: No  change from baseline ?   ?   ?Perception   ?  ?Praxis   ?  ? ?Pertinent Vitals/Pain Pain Assessment ?Pain Assessment: Faces ?Faces Pain Scale: Hurts whole lot ?Pain Location: R knee ?Pain Descriptors / Indicators: Grimacing, Discomfort ?Pain Intervention(s): Monitored during session, Patient requesting pain meds-RN notified, RN gave pain meds during session, Ice applied  ? ? ? ?Hand Dominance Right ?  ?Extremity/Trunk Assessment Upper Extremity Assessment ?Upper Extremity Assessment: Overall WFL for tasks assessed ?  ?Lower Extremity Assessment ?Lower Extremity Assessment: Defer to PT evaluation ?  ?Cervical / Trunk Assessment ?Cervical / Trunk Assessment: Normal ?  ?Communication Communication ?Communication: HOH ?  ?Cognition Arousal/Alertness: Awake/alert ?Behavior During Therapy: [facility] for tasks assessed/performed ?Overall Cognitive Status: Within Functional Limits for tasks assessed ?  ?  ?  ?  ?  ?  ?  ?  ?  ?  ?  ?  ?  ?  ?  ?  ?  ?  ?  ?General Comments   ? ?  ?Exercises   ?  ?Shoulder Instructions    ? ? ?Home Living Family/patient expects to be discharged to:: Private residence ?Living Arrangements: Spouse/significant other ?Available Help at Discharge: Family;Available 24 hours/day ?Type of Home: House ?Home Access: Stairs to enter ?Entrance Stairs-Number of Steps: 5 ?Entrance Stairs-Rails: Right ?Home Layout: One level ?  ?  ?Bathroom Shower/Tub: Walk-in shower ?  ?Bathroom Toilet: Standard ?  ?  ?Home Equipment: Rolling Walker (2 wheels);BSC/3in1;Shower seat;Hand held shower head;Toilet riser ?  ?  ?  ? ?  ?Prior Functioning/Environment Prior Level of Function : Needs assist ?  ?  ?  ?  ?  ?  ?Mobility Comments: uses RW following R TKA. ?ADLs Comments: independent prior to TKA, hasn't showered or done much bathing and dressing since TKA ?  ? ?  ?  ?OT Problem List: Decreased strength;Decreased activity tolerance;Decreased range of motion;Impaired balance (sitting and/or standing);Pain ?  ?   ?OT  Treatment/Interventions: Self-care/ADL training;DME and/or AE instruction;Patient/family education;Balance training;Therapeutic activities  ?  ?OT Goals(Current goals can be found in the care plan section) Acute Rehab OT Goals ?OT Goal Formulation: With patient ?Time For Goal Achievement: 09/14/21 ?Potential to Achieve Goals: Good ?ADL Goals ?Pt Will Perform Grooming: with min guard assist;standing ?Pt Will Transfer to Toilet: with min guard assist;ambulating ?Pt Will Perform Toileting - Clothing Manipulation and hygiene: with min guard assist;sit to/from stand ?Pt Will Perform Tub/Shower Transfer: with min assist;ambulating;shower seat;rolling walker  ?OT Frequency: Min 2X/week ?  ? ?Co-evaluation   ?  ?  ?  ?  ? ?  ?AM-PAC OT "6 Clicks" Daily Activity     ?Outcome Measure Help from another person eating meals?: None ?Help from another person taking care of personal grooming?: A Little ?Help from another person toileting, which includes using toliet, bedpan, or urinal?: A Lot ?Help from another person bathing (including washing, rinsing, drying)?: A Lot ?Help from another person to put on and taking off regular upper body clothing?: A Little ?Help from another person to put on and taking off regular lower body  clothing?: A Lot ?6 Click Score: 16 ?  ?End of Session Equipment Utilized During Treatment: Gait belt;Rolling walker (2 wheels) ? ?Activity Tolerance: Patient tolerated treatment well ?Patient left: in chair;with call bell/phone within reach ? ?OT Visit Diagnosis: Unsteadiness on feet (R26.81);Other abnormalities of gait and mobility (R26.89);Muscle weakness (generalized) (M62.81);Pain ?Pain - Right/Left: Right ?Pain - part of body: Leg  ?              ?Time: 4431-5400 ?OT Time Calculation (min): 53 min ?Charges:  OT General Charges ?$OT Visit: 1 Visit ?OT Evaluation ?$OT Eval Moderate Complexity: 1 Mod ?OT Treatments ?$Self Care/Home Management : 38-52 mins ? ?Martie Round, OTR/L ?Acute Rehabilitation  Services ?Pager: [phone] ?Office: [phone]  ? ?Evern Bio ?08/31/2021, 2:04 PM ?

## 2021-08-31 NOTE — Progress Notes (Signed)
Physical Therapy Treatment ?Patient Details ?Name: [name] HatchetR Martis Jr. ?MRN: [id number] ?DOB: [birth year] ?Today's Date: 08/31/2021 ? ? ?History of Present Illness [name] CountsBob is a 81 y.o. male [facility] with severe calf pain, LE weakness, and dizziness. Imaging (+) for small acute PE in R upper lobe and DVT in R calf. 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 PMH includes HTN, CKD, nephrolithiasis, and back surgery. ? ?  ?PT Comments  ? ? Patient is progressing towards goals slowly. Patient required min guard A for bed mobility and mod A for transfers with RW. Pt able to hop with 2 steps forward. Pt required cues for hand placement with transfers and standing tall in standing to decrease trunk flexion. Pt unable to bring R heel to ground due to pain in calf. Pt became lightheaded and dizzy in standing. BP in sitting following mobility with LE elevated 116/6671mmHg. Pt states "that is low for me". Recommend taking formal orthostatics for safety next session. Pt educated on continuing his HEP to decrease stiffness and increase pain modulation. Pt R knee flexion at 42 degrees with PROM. Pt will benefit from continued skilled PT to maximize pt's safety and independence with functional mobility ?   ?Recommendations for follow up therapy are one component of a multi-disciplinary discharge planning process, led by the attending physician.  Recommendations may be updated based on patient status, additional functional criteria and insurance authorization. ? ?Follow Up Recommendations ? Follow physician's recommendations for discharge plan and follow up therapies (if pt does not progress well, may need SNF) ?  ?  ?Assistance Recommended at Discharge Frequent or constant Supervision/Assistance  ?Patient can return home with the following A lot of help with walking and/or transfers;A lot of help with bathing/dressing/bathroom;Assistance with cooking/housework;Assist for transportation;Help with stairs or ramp for  entrance ?  ?Equipment Recommendations ? None recommended by PT  ?  ?Recommendations for Other Services   ? ? ?  ?Precautions / Restrictions Precautions ?Precautions: Knee ?Precaution Booklet Issued: No ?Required Braces or Orthoses: Other Brace ?Other Brace: R AFO that pt [facility] in ambulance [facility] ?Restrictions ?Weight Bearing Restrictions: Yes ?RLE Weight Bearing: Weight bearing as tolerated  ?  ? ?Mobility ? Bed Mobility ?Overal bed mobility: Needs Assistance ?Bed Mobility: Supine to Sit ?  ?  ?Supine to sit: Min guard ?  ?  ?General bed mobility comments: pt required min guard A for safety and steadying for sitting up at EOB. He required increased time and use of bedrails. Pt cued not to use strap to help move his RLE. ?  ? ?Transfers ?Overall transfer level: Needs assistance ?Equipment used: Rolling walker (2 wheels) ?Transfers: Sit to/from Stand, Bed to chair/wheelchair/BSC ?Sit to Stand: Mod assist ?  ?Step pivot transfers: Mod assist ?  ?  ?  ?General transfer comment: pt required mod A to power up and for steadying with RW. Pt unable and actively resisting PT to bring heel to ground to stretch calf in standing. Pt needed frequent cues to stand tall and activate his glutes. Pt kept RLE in hip flexion with toe touch weight bearing due to pain. Pt began feeling lightheaded and severe dizziness. Pt assisted to perform stand pivot transfer to recliner. BP in sitting with LE elevated and slightly reclined at 116/3871mmHg. Pt symptoms improved once sitting after ~2 minutes. Pt states "that's really low for me". Further mobility deferred for safety. ?  ? ?Ambulation/Gait ?  ?  ?  ?  ?  ?  ?  ?  General Gait Details: Pt unable and actively resisting DF to bring heel to ground in standing. Forward flexed trunk and toe touch weightbearing due to the pain on RLE. Pt hopped to take step forward. Cued to activate glutes. Pt experienced dizziness. BP in transfer comments. ? ? ?Stairs ?  ?  ?  ?  ?  ? ? ?Wheelchair  Mobility ?  ? ?Modified Rankin (Stroke Patients Only) ?  ? ? ?  ?Balance Overall balance assessment: Needs assistance ?Sitting-balance support: No upper extremity supported, Feet supported ?Sitting balance-Leahy Scale: Fair ?Sitting balance - Comments: requires no UE support in sitting, however, unable to maintain balance with weightshifts without support. Pt supervision in sitting. ?  ?Standing balance support: Bilateral upper extremity supported, During functional activity, Reliant on assistive device for balance ?Standing balance-Leahy Scale: Poor ?Standing balance comment: Reliant on BUE with RW with mod A for standing ?  ?  ?  ?  ?  ?  ?  ?  ?  ?  ?  ?  ? ?  ?Cognition Arousal/Alertness: Awake/alert ?Behavior During Therapy: [facility] for tasks assessed/performed ?Overall Cognitive Status: Within Functional Limits for tasks assessed ?  ?  ?  ?  ?  ?  ?  ?  ?  ?  ?  ?  ?  ?  ?  ?  ?  ?  ?  ? ?  ?Exercises Total Joint Exercises ?Ankle Circles/Pumps: AAROM, Right, 5 reps, Seated ?Quad Sets: 5 reps, Right ?Knee Flexion: AAROM, Right, Seated, 10 reps ? ?  ?General Comments General comments (skin integrity, edema, etc.): knee flexion at 42 degrees with PROM ?  ?  ? ?Pertinent Vitals/Pain Pain Assessment ?Pain Score: 7  ?Pain Location: R calf - DVT location ?Pain Descriptors / Indicators: Grimacing, Discomfort ?Pain Intervention(s): Limited activity within patient's tolerance  ? ? ?Home Living   ?  ?  ?  ?  ?  ?  ?  ?  ?  ?   ?  ?Prior Function    ?  ?  ?   ? ?PT Goals (current goals can now be found in the care plan section) Acute Rehab PT Goals ?Patient Stated Goal: to get better ?PT Goal Formulation: With patient ?Time For Goal Achievement: 09/13/21 ?Potential to Achieve Goals: Good ?Progress towards PT goals: Progressing toward goals (slowly) ? ?  ?Frequency ? ? ? Min 3X/week ? ? ? ?  ?PT Plan Current plan remains appropriate  ? ? ?Co-evaluation   ?  ?  ?  ?  ? ?  ?AM-PAC PT "6 Clicks" Mobility   ?Outcome Measure ? Help  needed turning from your back to your side while in a flat bed without using bedrails?: None ?Help needed moving from lying on your back to sitting on the side of a flat bed without using bedrails?: A Little ?Help needed moving to and from a bed to a chair (including a wheelchair)?: A Lot ?Help needed standing up from a chair using your arms (e.g., wheelchair or bedside chair)?: A Lot ?Help needed to walk in hospital room?: A Lot ?Help needed climbing 3-5 steps with a railing? : Total ?6 Click Score: 14 ? ?  ?End of Session Equipment Utilized During Treatment: Gait belt ?Activity Tolerance: Patient limited by pain ?Patient left: in chair;with chair alarm set;with family/visitor present;with call bell/phone within reach ?Nurse Communication: Mobility status ?PT Visit Diagnosis: Other abnormalities of gait and mobility (R26.89);Pain;Unsteadiness on feet (R26.81);Muscle weakness (generalized) (M62.81);Difficulty in  walking, not elsewhere classified (R26.2) ?Pain - Right/Left: Right ?Pain - part of body: Leg ?  ? ? ?Time: 1856-3149 ?PT Time Calculation (min) (ACUTE ONLY): 38 min ? ?Charges:  $Gait Training: 8-22 mins ?$Therapeutic Exercise: 8-22 mins ?$Therapeutic Activity: 8-22 mins          ?          ? ?Enis Slipper, SPT ? ?Enis Slipper ?08/31/2021, 1:42 PM ? ?

## 2021-08-31 NOTE — Telephone Encounter (Signed)
Ortho bundle 7 day in person meeting completed. Seen in hospital due to re-admit. ?

## 2021-08-31 NOTE — Plan of Care (Signed)

## 2021-09-01 DIAGNOSIS — I10 Essential (primary) hypertension: Secondary | ICD-10-CM | POA: Diagnosis not present

## 2021-09-01 DIAGNOSIS — I2699 Other pulmonary embolism without acute cor pulmonale: Secondary | ICD-10-CM | POA: Diagnosis not present

## 2021-09-01 DIAGNOSIS — D649 Anemia, unspecified: Secondary | ICD-10-CM | POA: Diagnosis not present

## 2021-09-01 DIAGNOSIS — I82461 Acute embolism and thrombosis of right calf muscular vein: Secondary | ICD-10-CM | POA: Diagnosis not present

## 2021-09-01 LAB — COMPREHENSIVE METABOLIC PANEL
ALT: 54 U/L — ABNORMAL HIGH (ref 0–44)
AST: 33 U/L (ref 15–41)
Albumin: 2.3 g/dL — ABNORMAL LOW (ref 3.5–5.0)
Alkaline Phosphatase: 71 U/L (ref 38–126)
Anion gap: 6 (ref 5–15)
BUN: 22 mg/dL (ref 8–23)
CO2: 23 mmol/L (ref 22–32)
Calcium: 8.2 mg/dL — ABNORMAL LOW (ref 8.9–10.3)
Chloride: 103 mmol/L (ref 98–111)
Creatinine, Ser: 1.28 mg/dL — ABNORMAL HIGH (ref 0.61–1.24)
GFR, Estimated: 57 mL/min — ABNORMAL LOW (ref >60)
Glucose, Bld: 110 mg/dL — ABNORMAL HIGH (ref 70–99)
Potassium: 3.8 mmol/L (ref 3.5–5.1)
Sodium: 132 mmol/L — ABNORMAL LOW (ref 135–145)
Total Bilirubin: 0.9 mg/dL (ref 0.3–1.2)
Total Protein: 5.4 g/dL — ABNORMAL LOW (ref 6.5–8.1)

## 2021-09-01 LAB — CBC
HCT: 30.5 % — ABNORMAL LOW (ref 39.0–52.0)
Hemoglobin: 10.3 g/dL — ABNORMAL LOW (ref 13.0–17.0)
MCH: 29.7 pg (ref 26.0–34.0)
MCHC: 33.8 g/dL (ref 30.0–36.0)
MCV: 87.9 fL (ref 80.0–100.0)
Platelets: 265 10*3/uL (ref 150–400)
RBC: 3.47 MIL/uL — ABNORMAL LOW (ref 4.22–5.81)
RDW: 13.4 % (ref 11.5–15.5)
WBC: 10.1 10*3/uL (ref 4.0–10.5)
nRBC: 0 % (ref 0.0–0.2)

## 2021-09-01 MED ORDER — HYDROCHLOROTHIAZIDE 12.5 MG PO TABS: 6.2500 mg | ORAL_TABLET | Freq: Every day | ORAL | Status: DC

## 2021-09-01 MED ORDER — BISOPROLOL FUMARATE 10 MG PO TABS
10.0000 mg | ORAL_TABLET | Freq: Every day | ORAL | Status: DC
  Administered 2021-09-01 – 2021-09-05 (×5): 10 mg via ORAL
  Filled 2021-09-01 (×5): qty 1

## 2021-09-01 NOTE — Progress Notes (Signed)
Patient unable to void on his own. Foley catheter reinserted this morning after 3 in and out cath. ?

## 2021-09-01 NOTE — Plan of Care (Signed)

## 2021-09-01 NOTE — Plan of Care (Signed)
  Problem: Activity: Goal: Risk for activity intolerance will decrease Outcome: Progressing   Problem: Coping: Goal: Level of anxiety will decrease Outcome: Progressing   Problem: Pain Managment: Goal: General experience of comfort will improve Outcome: Progressing   Problem: Safety: Goal: Ability to remain free from injury will improve Outcome: Progressing   

## 2021-09-01 NOTE — Progress Notes (Signed)
Physical Therapy Treatment ?Patient Details ?Name: Johathan Province. ?MRN: [id number] ?DOB: 03/09/1941 ?Today's Date: 09/01/2021 ? ? ?History of Present Illness [name] is a 81 y.o. male presenting to [facility] ED 08/29/21 with severe calf pain, LE weakness, and dizziness. Imaging (+) for small acute PE in R upper lobe and DVT in R calf. 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 PMH includes HTN, CKD, nephrolithiasis, and back surgery. ? ?  ?PT Comments  ? ? Patient is progressing towards goals with ability to tolerate ambulation today. Pt required supervision with bed mobility and min guard A for transfers and ambulation using RW. Pt able to take 3 steps with a step-through pattern with 9/10 R calf pain. Cues given to concentrate on bringing heel close to the ground with ambulation during stance phase. Pt cued to stand tall and activate glutes for improved hip extension. Pt BP did drop significantly from sit to stand, however, pt asymptomatic. Orthostatics listed below. RN notified. SPT educated pt on home health therapies vs SNF level therapies. Acute PT continues to recommend SNF for pt safety and decrease burden of care on wife. Pt will benefit from continued skilled PT to increase his safety and independence with functional mobility. ? ?Orthostatic BPs ? ?Supine 148/73mmHg  ?Sitting 143/43mmHg  ?Standing 104/76mmHg (pt asymptomatic)  ?After ambulation in sitting with feet elevated 115/8mmHg  ?  ?   ?Recommendations for follow up therapy are one component of a multi-disciplinary discharge planning process, led by the attending physician.  Recommendations may be updated based on patient status, additional functional criteria and insurance authorization. ? ?Follow Up Recommendations ? Follow physician's recommendations for discharge plan and follow up therapies ?  ?  ?Assistance Recommended at Discharge Frequent or constant Supervision/Assistance  ?Patient can return home with the following A little help with walking and/or  transfers;A little help with bathing/dressing/bathroom;Assist for transportation;Help with stairs or ramp for entrance ?  ?Equipment Recommendations ? None recommended by PT  ?  ?Recommendations for Other Services   ? ? ?  ?Precautions / Restrictions Precautions ?Precautions: Knee;Fall ?Precaution Booklet Issued: No ?Required Braces or Orthoses: Other Brace (AFO - pt left in EMS) ?Other Brace: missing a piece from his AFO, [employer] notified on 08/31/21 ?Restrictions ?Weight Bearing Restrictions: Yes ?RLE Weight Bearing: Weight bearing as tolerated  ?  ? ?Mobility ? Bed Mobility ?Overal bed mobility: Needs Assistance ?Bed Mobility: Supine to Sit ?  ?  ?Supine to sit: Supervision ?  ?  ?General bed mobility comments: required supervision for safety to sit at EOB ?  ? ?Transfers ?Overall transfer level: Needs assistance ?Equipment used: Rolling walker (2 wheels) ?Transfers: Sit to/from Stand ?Sit to Stand: Min guard ?  ?  ?  ?  ?  ?General transfer comment: required min guard to rise and steady for safety. Pt not putting weight through due to calf pain ?  ? ?Ambulation/Gait ?Ambulation/Gait assistance: Min guard ?Gait Distance (Feet): 50 Feet ?Assistive device: Rolling walker (2 wheels) ?Gait Pattern/deviations: Step-to pattern, Decreased stance time - right, Decreased stride length, Decreased step length - left ?Gait velocity: decreased ?Gait velocity interpretation: 1.31 - 2.62 ft/sec, indicative of limited community ambulator ?  ?General Gait Details: pt with slow gait requiring min guard A for safety and steadying. Pt required cues to stand tall and bring R heel as close to the ground as possible to stretch calf in stance phase. took several standing rest breaks but declined wanting to sit. Cued to activate  glutes to increase hip extension in standing and ambulation. Pt asymptomatic. ? ? ?Stairs ?  ?  ?  ?  ?  ? ? ?Wheelchair Mobility ?  ? ?Modified Rankin (Stroke Patients Only) ?  ? ? ?  ?Balance Overall balance  assessment: Needs assistance ?Sitting-balance support: No upper extremity supported, Feet supported ?Sitting balance-Leahy Scale: Good ?Sitting balance - Comments: pt supervision in sitting for safety, however, not physical assist needed ?  ?Standing balance support: Bilateral upper extremity supported, During functional activity, Reliant on assistive device for balance ?Standing balance-Leahy Scale: Poor ?Standing balance comment: Reliant on BUE with RW with min guard for steadying in standing. Able to release L hand from walker for BP without increased assist ?  ?  ?  ?  ?  ?  ?  ?  ?  ?  ?  ?  ? ?  ?Cognition Arousal/Alertness: Awake/alert ?Behavior During [facility]: [facility] for tasks assessed/performed ?Overall Cognitive Status: Within Functional Limits for tasks assessed ?  ?  ?  ?  ?  ?  ?  ?  ?  ?  ?  ?  ?  ?  ?  ?  ?  ?  ?  ? ?  ?Exercises Total Joint Exercises ?Quad Sets: Right, 10 reps ?Heel Slides: 5 reps, Right, AROM ?Knee Flexion: 10 reps, AAROM, Right ? ?  ?General Comments General comments (skin integrity, edema, etc.): BP in supine: 148/75, sitting: 143/84, standing: 104/87 (asymptomatic today), after ambulation in sitting: 115/83 ?  ?  ? ?Pertinent Vitals/Pain Pain Assessment ?Pain Assessment: 0-10 ?Pain Score: 8  ?Pain Location: R calf ?Pain Descriptors / Indicators: Grimacing, Discomfort ?Pain Intervention(s): Monitored during session  ? ? ?Home Living   ?  ?  ?  ?  ?  ?  ?  ?  ?  ?   ?  ?Prior Function    ?  ?  ?   ? ?PT Goals (current goals can now be found in the care plan section) Acute Rehab PT Goals ?Patient Stated Goal: to get better ?PT Goal Formulation: With patient ?Time For Goal Achievement: 09/13/21 ?Potential to Achieve Goals: Good ?Progress towards PT goals: Progressing toward goals ? ?  ?Frequency ? ? ? Min 3X/week ? ? ? ?  ?PT Plan Current plan remains appropriate  ? ? ?Co-evaluation   ?  ?  ?  ?  ? ?  ?AM-PAC PT "6 Clicks" Mobility   ?Outcome Measure ? Help needed turning from your back  to your side while in a flat bed without using bedrails?: None ?Help needed moving from lying on your back to sitting on the side of a flat bed without using bedrails?: A Little ?Help needed moving to and from a bed to a chair (including a wheelchair)?: A Little ?Help needed standing up from a chair using your arms (e.g., wheelchair or bedside chair)?: A Little ?Help needed to walk in hospital room?: A Little ?Help needed climbing 3-5 steps with a railing? : A Lot ?6 Click Score: 18 ? ?  ?End of Session Equipment Utilized During Treatment: Gait belt ?Activity Tolerance: Patient tolerated treatment well ?Patient left: in chair;with call bell/phone within reach;with chair alarm set ?Nurse Communication: Mobility status ?PT Visit Diagnosis: Other abnormalities of gait and mobility (R26.89);Pain;Unsteadiness on feet (R26.81);Muscle weakness (generalized) (M62.81);Difficulty in walking, not elsewhere classified (R26.2) ?Pain - Right/Left: Right ?Pain - part of body: Leg ?  ? ? ?Time: [id number] ?PT Time Calculation (min) (ACUTE ONLY):  40 min ? ?Charges:  $Gait Training: 38-52 mins          ?          ? ?Enis Slipper, SPT  ? ?Enis Slipper ?09/01/2021, 3:52 PM ? ?

## 2021-09-01 NOTE — Progress Notes (Signed)
? ?[facility] ?PROGRESS NOTE ? ? ?R Vinetta Bergamo. EB:[id number] DOB: [birth year] DOA: 08/29/2021  2 ?DOS: the patient was seen and examined on 09/01/2021 ? ?PCP: Lawerance Cruel, MD ? ?Brief History and Hospital Course:  ?81 y.o. male with medical history significant of hypertension, hyperlipidemia, CKD stage II/III, BPH, and nephrolithiasis presented with complaints of weakness and worsening pain in his right lower extremity.  Patient recently had right total knee replacement with Dr. Ninfa Linden on 4/4.  After the surgery had been placed on aspirin 81 mg twice daily.  He reports he was in the hospital until 4/7 when they removed his Foley catheter.  After getting home however he was unable to urinate and due to pain was seen in the emergency department yesterday where a Foley catheter was placed draining out 780 mL of urine.  He was recommended to follow-up with his urologist Dr. Junious Silk, but the soonest appointment was for April 25.  He notes ever since the surgery he has been having some swelling in his right leg.  Has been experiencing a lot of pain in the right leg which worsened.  Has not been able to do his activities of daily living at home without assistance.  Got dizzy after he went to the bathroom.  EMS was called by his wife.  Evaluation revealed that patient had a DVT in the right lower extremity and CT angiogram showed a small pulmonary embolus.  Patient was hospitalized for further management. ? ?Consultants: None ? ?Procedures: None ? ? ? ?Subjective: ?Patient continues to have pain in the right lower extremity especially with mobility.  Denies any chest pain shortness of breath.  No nausea vomiting.   ? ? ? ?Assessment/Plan: ? ?* Acute pulmonary embolus and right leg DVT status post total right knee replacement ?Patient had just recently undergone right total knee replacement by Dr. Ninfa Linden on 4/4.   ?Ultrasound of the right lower extremity revealed acute DVT of the right posterior tibial  and peroneal veins.  CT angiogram noted small pulmonary emboli of the right upper lobe without significant concern of right heart strain.   ?Patient was initially started on heparin.  Changed over to apixaban. ?Since troponins are normal and it was a small PE there is no reason to do an echocardiogram at this time.  No evidence for right heart strain was noted on CT angiogram.   ? ?Chronic kidney disease, stage IIIa (moderate) (HCC) ?Renal function seems to be close to baseline.  Continue to monitor.    ? ?Status post total knee replacement, right ?Patient's pain medication dose was adjusted for uncontrolled pain.  Pain seems to be better.  Was able to do better with physical therapy yesterday though still very limited in mobility.  He was taught exercises by orthopedics. ?Patient still not sure whether he will be able to go home or not but at the same time he is reluctant to consider skilled nursing facility for rehab.  He wants to discuss further with Dr. Ninfa Linden. ?Patient mentioned that he lives with his wife but his wife is unable to care for him if he requires significant assistance. ? ?Essential hypertension ?Noted to be on bisoprolol-HCTZ and Avapro prior to admission.  Creatinine noted to be 1.28 today which is a slight increase from yesterday.  We will hold the HCTZ and Avapro for now.  Recheck labs tomorrow.  Blood pressure reasonably well controlled  ? ?Leukocytosis ?Likely reactive.  No infectious source found.   ? ?BPH with  urinary obstruction ?Foley catheter was placed recently in the emergency department for urinary retention on 4/10.   ?Patient has follow-up appointment with Dr. Junious [facility]k [facility] set for April 25.   ?Home medication regimen includes Flomax 0.4 mg twice daily and Proscar 5 mg daily. ?A voiding trial was attempted by orthopedics yesterday which the patient failed.  A Foley catheter had to be reinserted overnight.  Patient to follow-up with urology in the outpatient setting.   Will need to be discharged with Foley catheter. ? ?Normocytic anemia ?Mild drop in hemoglobin is noted.  No overt bleeding appreciated.  Hemoglobin stable. ? ?Cholelithiasis ?Incidentally noted on CT scan.  Asymptomatic.  LFTs reviewed. ? ?Right leg DVT ([place]) ?See under acute PE. ? ? ?Obesity ?Estimated body mass index is 31.78 kg/m? as calculated from the following: ?  Height as of this encounter: 5\' 8"  (1.727 m). ?  Weight as of this encounter: 94.8 kg. ? ? ?DVT Prophylaxis: Apixaban ?Code Status: Full code ?Family Communication: Discussed with patient ?Disposition Plan: To be determined ? ?Status is: Inpatient ?Remains inpatient appropriate because: Acute PE and DVT, significant pain in the right lower extremity limiting mobility ? ? ? ? ? ? ?Medications: Scheduled: ? apixaban  10 mg Oral BID  ? Followed by  ? [START ON 09/05/2021] apixaban  5 mg Oral BID  ? bisoprolol  10 mg Oral Daily  ? finasteride  5 mg Oral Daily  ? polyethylene glycol  17 g Oral Daily  ? senna-docusate  2 tablet Oral BID  ? sodium chloride flush  3 mL Intravenous Q12H  ? tamsulosin  0.4 mg Oral BID  ? ?Continuous: ?[id number] **OR** acetaminophen, albuterol, methocarbamol, ondansetron **OR** ondansetron (ZOFRAN) IV, oxyCODONE ? ?Antibiotics: ?Anti-infectives (From admission, onward)  ? ? None  ? ?  ? ? ?Objective: ? ?Vital Signs ? ?Vitals:  ? 08/31/21 1236 08/31/21 1401 08/31/21 2011 09/01/21 0811  ?BP: 116/71 125/73 (!) 141/58 (!) 144/75  ?Pulse: 62 63 63 63  ?Resp:  17 18 17   ?Temp:  98.4 ?F (36.9 ?C) 98.4 ?F (36.9 ?C) 97.8 ?F (36.6 ?C)  ?TempSrc:  Oral Oral Oral  ?SpO2:  100% 97% 91%  ?Weight:      ?Height:      ? ? ?Intake/Output Summary (Last 24 hours) at 09/01/2021 1028 ?Last data filed at 09/01/2021 0135 ?Gross per 24 hour  ?Intake --  ?Output 1300 ml  ?Net -1300 ml  ? ?Filed Weights  ? 08/29/21 1210  ?Weight: 94.8 kg  ? ? ?General appearance: Awake alert.  In no distress ?Resp: Clear to auscultation bilaterally.  Normal  effort ?Cardio: S1-S2 is normal regular.  No S3-S4.  No rubs murmurs or bruit ?GI: Abdomen is soft.  Nontender nondistended.  Bowel sounds are present normal.  No masses organomegaly ?Extremities: Right lower extremity noted to be swollen. ?Neurologic: Alert and oriented x3.  No focal neurological deficits.  ? ? ? ?Lab Results: ? ?Data Reviewed: I have personally reviewed labs and imaging study reports ? ?CBC: ?Recent Labs  ?Lab 08/29/21 ?0704 08/30/21 ?0421 08/31/21 ?0151 09/01/21 ?0240  ?WBC 11.6* 10.0 10.6* 10.1  ?HGB 11.3* 10.0* 10.3* 10.3*  ?HCT 34.4* 30.7* 30.6* 30.5*  ?MCV 90.8 90.3 88.4 87.9  ?PLT 231 225 238 265  ? ? ?Basic Metabolic Panel: ?Recent Labs  ?Lab 08/29/21 ?0704 08/30/21 ?0421 08/31/21 ?0151 09/01/21 ?0240  ?NA 135 137 133* 132*  ?K 4.5 3.8 4.3 3.8  ?CL 104 109 103 103  ?CO2  23 22 24 23   ?GLUCOSE 123* 97 102* 110*  ?BUN 23 27* 23 22  ?CREATININE 1.26* 1.15 1.12 1.28*  ?CALCIUM 8.4* 7.8* 8.1* 8.2*  ? ? ?GFR: ?Estimated Creatinine Clearance: 53.1 mL/min (A) (by C-G formula based on SCr of 1.28 mg/dL (H)). ? ?Liver Function Tests: ?Recent Labs  ?Lab 08/30/21 ?0421 08/31/21 ?0151 09/01/21 ?0240  ?AST 36 34 33  ?ALT 47* 53* 54*  ?ALKPHOS [id number]  ?BILITOT 1.3* 1.2 0.9  ?PROT 4.9* 5.3* 5.4*  ?ALBUMIN 2.2* 2.3* 2.3*  ? ? ? ?Radiology Studies: ?No results found. ? ? ? ? LOS: 2 days  ? ?Bonnielee Haff ? ?[facility] ?Pager on [email or web address] ? ?09/01/2021, 10:28 AM ? ? ?

## 2021-09-02 LAB — BASIC METABOLIC PANEL
Anion gap: 7 (ref 5–15)
BUN: 22 mg/dL (ref 8–23)
CO2: 23 mmol/L (ref 22–32)
Calcium: 8.3 mg/dL — ABNORMAL LOW (ref 8.9–10.3)
Chloride: 105 mmol/L (ref 98–111)
Creatinine, Ser: 1.19 mg/dL (ref 0.61–1.24)
GFR, Estimated: >60 mL/min (ref >60)
Glucose, Bld: 130 mg/dL — ABNORMAL HIGH (ref 70–99)
Potassium: 3.9 mmol/L (ref 3.5–5.1)
Sodium: 135 mmol/L (ref 135–145)

## 2021-09-02 MED ORDER — CEPHALEXIN 500 MG PO CAPS
500.0000 mg | ORAL_CAPSULE | Freq: Two times a day (BID) | ORAL | Status: DC
  Administered 2021-09-02 – 2021-09-05 (×7): 500 mg via ORAL
  Filled 2021-09-02 (×7): qty 1

## 2021-09-02 NOTE — Plan of Care (Signed)
  Problem: Activity: Goal: Risk for activity intolerance will decrease Outcome: Progressing   Problem: Nutrition: Goal: Adequate nutrition will be maintained Outcome: Progressing   Problem: Coping: Goal: Level of anxiety will decrease Outcome: Progressing   Problem: Safety: Goal: Ability to remain free from injury will improve Outcome: Progressing   

## 2021-09-02 NOTE — Plan of Care (Signed)

## 2021-09-02 NOTE — Progress Notes (Signed)
Patient ID: Harshith Pursell., male   DOB: 1941/01/02, 81 y.o.   MRN: [id number] ?I came by the bedside again to check on Mr. [place].  He did fail his voiding trial and the Foley is in and should remain in place.  I have spoken to his urologist Dr. Mena Goes and he said the Foley should definitely stay in until outpatient urology follow-up can be established.  The patient's right operative knee is stable.  He does have foot drop.  His incision is clean and dry.  He is breathing well today as well.  However, his mobility is still quite limited and he and his wife are concerned about her ability to be able to help him mobilize at home.  I put in the order for the transitional care team to look at the possibility of short-term skilled nursing versus whether or not the patient could qualify for inpatient rehab here.  Dr. Mena Goes did ask me to put him on an antibiotic orally to take at night.  I will also make sure he is on the appropriate medications given his BPH and urinary issues per those recommended by urology. ?

## 2021-09-02 NOTE — Progress Notes (Signed)
? ?[facility] ?PROGRESS NOTE ? ? ?R Vinetta Bergamo. [id number] DOB: 08-15-1940 DOA: 08/29/2021  3 ?DOS: the patient was seen and examined on 09/02/2021 ? ?PCP: Lawerance Cruel, MD ? ?Brief History and Hospital Course:  ?81 y.o. male with medical history significant of hypertension, hyperlipidemia, CKD stage II/III, BPH, and nephrolithiasis presented with complaints of weakness and worsening pain in his right lower extremity.  Patient recently had right total knee replacement with Dr. Ninfa Linden on 4/4.  After the surgery had been placed on aspirin 81 mg twice daily.  He reports he was in the hospital until 4/7 when they removed his Foley catheter.  After getting home however he was unable to urinate and due to pain was seen in the emergency department yesterday where a Foley catheter was placed draining out 780 mL of urine.  He was recommended to follow-up with his urologist Dr. Junious Silk, but the soonest appointment was for April 25.  He notes ever since the surgery he has been having some swelling in his right leg.  Has been experiencing a lot of pain in the right leg which worsened.  Has not been able to do his activities of daily living at home without assistance.  Got dizzy after he went to the bathroom.  EMS was called by his wife.  Evaluation revealed that patient had a DVT in the right lower extremity and CT angiogram showed a small pulmonary embolus.  Patient was hospitalized for further management. ? ?Consultants: None ? ?Procedures: None ? ? ? ?Subjective: ?Patient mentions that he continues to have significant pain in his right knee.  Unable to bend his knee or bear weight.  Discouraged.  He states that his wife will be unable to take care of him at home.  He is open to going to rehabilitation.   ? ? ? ?Assessment/Plan: ? ?* Acute pulmonary embolus and right leg DVT status post total right knee replacement ?Patient had just recently undergone right total knee replacement by Dr. Ninfa Linden on 4/4.    ?Ultrasound of the right lower extremity revealed acute DVT of the right posterior tibial and peroneal veins.  CT angiogram noted small pulmonary emboli of the right upper lobe without significant concern of right heart strain.   ?Patient was initially started on heparin.  Changed over to apixaban. ?Since troponins are normal and it was a small PE there is no reason to do an echocardiogram at this time.  No evidence for right heart strain was noted on CT angiogram.   ?Patient is tolerating his anticoagulation well. ? ?Chronic kidney disease, stage IIIa (moderate) (HCC) ?Renal function seems to be close to baseline.  Continue to monitor.    ? ?Status post total knee replacement, right ?Patient's pain medication dose was adjusted for uncontrolled pain.  Pain seems to be better.   ?Despite some improvement in his mobility he is still unable to mobilize independently.  His wife is unable to take care of him at home.  He is not able to do his ADLs with his his current level of activity. ?Seen by orthopedics as well this morning.  Referral has been placed to [facility] for skilled nursing facility placement.  I also requested inpatient rehab to screen him to see if he would qualify for that.  Patient is agreeable to rehab. ? ?Essential hypertension ?Noted to be on bisoprolol-HCTZ and Avapro prior to admission.  ?His HCTZ and Avapro were held due to elevated creatinine.  Blood pressure is reasonably well controlled. ?  Continue bisoprolol only for now.   ? ?Leukocytosis ?Likely reactive.  No infectious source found.   ? ?BPH with urinary obstruction ?Foley catheter was placed recently in the emergency department for urinary retention on 4/10.   ?Patient has follow-up appointment with Dr. [facility]ious [facility]k [facility] set for April 25.   ?Home medication regimen includes Flomax 0.4 mg twice daily and Proscar 5 mg daily. ?A voiding trial was attempted by orthopedics which the patient failed.  Foley catheter was reinserted.  Continue  with Flomax. ?It looks like patient has been started on cephalexin by orthopedics after discussions with urology.   ? ?Normocytic anemia ?Mild drop in hemoglobin is noted.  No overt bleeding appreciated.  Hemoglobin stable. ? ?Cholelithiasis ?Incidentally noted on CT scan.  Asymptomatic.  LFTs reviewed. ? ?Right leg DVT ([place]) ?See under acute PE. ? ? ?Obesity ?Estimated body mass index is 31.78 kg/m? as calculated from the following: ?  Height as of this encounter: 5\' 8"  (1.727 m). ?  Weight as of this encounter: 94.8 kg. ? ? ?DVT Prophylaxis: Apixaban ?Code Status: Full code ?Family Communication: Discussed with patient ?Disposition Plan: Skilled nursing facility versus CIR ? ?Status is: Inpatient ?Remains inpatient appropriate because: Acute PE and DVT, significant pain in the right lower extremity limiting mobility ? ? ? ? ? ? ?Medications: Scheduled: ? apixaban  10 mg Oral BID  ? Followed by  ? [START ON 09/05/2021] apixaban  5 mg Oral BID  ? bisoprolol  10 mg Oral Daily  ? cephALEXin  500 mg Oral BID  ? finasteride  5 mg Oral Daily  ? polyethylene glycol  17 g Oral Daily  ? senna-docusate  2 tablet Oral BID  ? sodium chloride flush  3 mL Intravenous Q12H  ? tamsulosin  0.4 mg Oral BID  ? ?Continuous: ?[id number] **OR** acetaminophen, albuterol, methocarbamol, ondansetron **OR** ondansetron (ZOFRAN) IV, oxyCODONE ? ?Antibiotics: ?Anti-infectives (From admission, onward)  ? ? Start     Dose/Rate Route Frequency Ordered Stop  ? 09/02/21 1000  cephALEXin (KEFLEX) capsule 500 mg       ? 500 mg Oral 2 times daily 09/02/21 0849    ? ?  ? ? ?Objective: ? ?Vital Signs ? ?Vitals:  ? 09/01/21 0811 09/01/21 1516 09/01/21 2138 09/02/21 0836  ?BP: (!) 144/75 126/70 (!) 146/65 132/66  ?Pulse: 63 70 67 75  ?Resp: 17 17 18 17   ?Temp: 97.8 ?F (36.6 ?C) 98.9 ?F (37.2 ?C) 98.7 ?F (37.1 ?C) 97.7 ?F (36.5 ?C)  ?TempSrc: Oral Oral Oral Oral  ?SpO2: 91% 100% 98% 100%  ?Weight:      ?Height:      ? ? ?Intake/Output Summary (Last  24 hours) at 09/02/2021 1014 ?Last data filed at 09/02/2021 0900 ?Gross per 24 hour  ?Intake 120 ml  ?Output 1100 ml  ?Net -980 ml  ? ?Filed Weights  ? 08/29/21 1210  ?Weight: 94.8 kg  ? ? ?General appearance: Awake alert.  In no distress ?Resp: Clear to auscultation bilaterally.  Normal effort ?Cardio: S1-S2 is normal regular.  No S3-S4.  No rubs murmurs or bruit ?GI: Abdomen is soft.  Nontender nondistended.  Bowel sounds are present normal.  No masses organomegaly ?Extremities: Right lower extremity is swollen.  Stable. ?Neurologic: Alert and oriented x3.  No focal neurological deficits.  ? ? ? ?Lab Results: ? ?Data Reviewed: I have personally reviewed labs and imaging study reports ? ?CBC: ?Recent Labs  ?Lab 08/29/21 ?0704 08/30/21 ?0421 08/31/21 ?0151 09/01/21 ?0240  ?  WBC 11.6* 10.0 10.6* 10.1  ?HGB 11.3* 10.0* 10.3* 10.3*  ?HCT 34.4* 30.7* 30.6* 30.5*  ?MCV 90.8 90.3 88.4 87.9  ?PLT 231 225 238 265  ? ? ?Basic Metabolic Panel: ?Recent Labs  ?Lab 08/29/21 ?0704 08/30/21 ?0421 08/31/21 ?0151 09/01/21 ?[id number] 09/02/21 ?0136  ?NA 135 137 133* 132* 135  ?K 4.5 3.8 4.3 3.8 3.9  ?CL 104 109 103 103 105  ?CO2 23 22 24 23 23   ?GLUCOSE 123* 97 102* 110* 130*  ?BUN 23 27* 23 22 22   ?CREATININE 1.26* 1.15 1.12 1.28* 1.19  ?CALCIUM 8.4* 7.8* 8.1* 8.2* 8.3*  ? ? ?GFR: ?Estimated Creatinine Clearance: 57.2 mL/min (by C-G formula based on SCr of 1.19 mg/dL). ? ?Liver Function Tests: ?Recent Labs  ?Lab 08/30/21 ?0421 08/31/21 ?0151 09/01/21 ?0240  ?AST 36 34 33  ?ALT 47* 53* 54*  ?ALKPHOS [id number]  ?BILITOT 1.3* 1.2 0.9  ?PROT 4.9* 5.3* 5.4*  ?ALBUMIN 2.2* 2.3* 2.3*  ? ? ? ?Radiology Studies: ?No results found. ? ? ? ? LOS: 3 days  ? ?Bonnielee Haff ? ?[facility] ?Pager on [email or web address] ? ?09/02/2021, 10:14 AM ? ? ?

## 2021-09-02 NOTE — Discharge Instructions (Signed)
Information on my medicine - ELIQUIS? (apixaban) ? ?This medication education was reviewed with me or my healthcare representative as part of my discharge preparation.  The pharmacist that spoke with me during my hospital stay was:  ?Ulyses Southward, RPH-CPP ? ?Why was Eliquis? prescribed for you? ?Eliquis? was prescribed to treat blood clots that may have been found in the veins of your legs (deep vein thrombosis) or in your lungs (pulmonary embolism) and to reduce the risk of them occurring again. ? ?What do You need to know about Eliquis? ? ?The starting dose is 10 mg (two 5 mg tablets) taken TWICE daily for the FIRST SEVEN (7) DAYS, then on (enter date)  09/05/21  the dose is reduced to ONE 5 mg tablet taken TWICE daily.  Eliquis? may be taken with or without food.  ? ?Try to take the dose about the same time in the morning and in the evening. If you have difficulty swallowing the tablet whole please discuss with your pharmacist how to take the medication safely. ? ?Take Eliquis? exactly as prescribed and DO NOT stop taking Eliquis? without talking to the doctor who prescribed the medication.  Stopping may increase your risk of developing a new blood clot.  Refill your prescription before you run out. ? ?After discharge, you should have regular check-up appointments with your healthcare provider that is prescribing your Eliquis?. ?   ?What do you do if you miss a dose? ?If a dose of ELIQUIS? is not taken at the scheduled time, take it as soon as possible on the same day and twice-daily administration should be resumed. The dose should not be doubled to make up for a missed dose. ? ?Important Safety Information ?A possible side effect of Eliquis? is bleeding. You should call your healthcare provider right away if you experience any of the following: ?Bleeding from an injury or your nose that does not stop. ?Unusual colored urine (red or dark brown) or unusual colored stools (red or black). ?Unusual bruising for unknown  reasons. ?A serious fall or if you hit your head (even if there is no bleeding). ? ?Some medicines may interact with Eliquis? and might increase your risk of bleeding or clotting while on Eliquis?Marland Kitchen To help avoid this, consult your healthcare provider or pharmacist prior to using any new prescription or non-prescription medications, including herbals, vitamins, non-steroidal anti-inflammatory drugs (NSAIDs) and supplements. ? ?This website has more information on Eliquis? (apixaban): http://www.eliquis.com/eliquis/home ? ?

## 2021-09-02 NOTE — Plan of Care (Signed)
  Problem: Coping: Goal: Level of anxiety will decrease Outcome: Progressing   Problem: Pain Managment: Goal: General experience of comfort will improve Outcome: Progressing   Problem: Safety: Goal: Ability to remain free from injury will improve Outcome: Progressing   Problem: Skin Integrity: Goal: Risk for impaired skin integrity will decrease Outcome: Progressing   

## 2021-09-03 NOTE — Progress Notes (Signed)
? ?[facility] ?PROGRESS NOTE ? ? ?[name]. [id number] DOB: 22-Aug-1940 DOA: 08/29/2021  4 ?DOS: the patient was seen and examined on 09/03/2021 ? ?PCP: Lawerance Cruel, MD ? ?Brief History and Hospital Course:  ?81 y.o. male with medical history significant of hypertension, hyperlipidemia, CKD stage II/III, BPH, and nephrolithiasis presented with complaints of weakness and worsening pain in his right lower extremity.  Patient recently had right total knee replacement with Dr. Ninfa Linden on 4/4.  After the surgery had been placed on aspirin 81 mg twice daily.  He reports he was in the hospital until 4/7 when they removed his Foley catheter.  After getting home however he was unable to urinate and due to pain was seen in the emergency department yesterday where a Foley catheter was placed draining out 780 mL of urine.  He was recommended to follow-up with his urologist Dr. Junious Silk, but the soonest appointment was for April 25.  He notes ever since the surgery he has been having some swelling in his right leg.  Has been experiencing a lot of pain in the right leg which worsened.  Has not been able to do his activities of daily living at home without assistance.  Got dizzy after he went to the bathroom.  EMS was called by his wife.  Evaluation revealed that patient had a DVT in the right lower extremity and CT angiogram showed a small pulmonary embolus.  Patient was hospitalized for further management. ? ?Consultants: Orthopedics is following ? ?Procedures: None ? ? ? ?Subjective: ?Patient mentioned that the pain in the right lower extremity is better although still is unable to bend his knee and mobilize independently.  Denies any chest pain or shortness of breath.   ? ? ? ?Assessment/Plan: ? ?* Acute pulmonary embolus and right leg DVT status post total right knee replacement ?Patient had just recently undergone right total knee replacement by Dr. Ninfa Linden on 4/4.   ?Ultrasound of the right lower  extremity revealed acute DVT of the right posterior tibial and peroneal veins.  CT angiogram noted small pulmonary emboli of the right upper lobe without significant concern of right heart strain.   ?Patient was initially started on heparin.  Changed over to apixaban. ?Since troponins are normal and it was a small PE there is no reason to do an echocardiogram at this time.  No evidence for right heart strain was noted on CT angiogram.   ?Patient is tolerating his anticoagulation well. ? ?Chronic kidney disease, stage IIIa (moderate) (HCC) ?Renal function seems to be close to baseline.  Continue to monitor.   Monitor urine output. ? ?Status post total knee replacement, right ?Patient's pain medication dose was adjusted for uncontrolled pain.  Pain is better controlled. ?Despite some improvement in his mobility he is still unable to mobilize independently.  His wife is unable to take care of him at home.  He is not able to do his ADLs with his his current level of activity. ?Discussed with orthopedics.  They feel that patient may benefit from short-term rehab.  Referral has been placed to [facility].  Referral also placed to inpatient rehab. ? ?Essential hypertension ?Noted to be on bisoprolol-HCTZ and Avapro prior to admission.  ?His HCTZ and Avapro were held due to elevated creatinine.  Blood pressure is reasonably well controlled. ?Continue bisoprolol only for now.   ? ?Leukocytosis ?Likely reactive.  No infectious source found.  Resolved. ? ?BPH with urinary obstruction ?Foley catheter was placed recently in the  emergency department for urinary retention on 4/10.   ?Patient has follow-up appointment with Dr. Junious Silk [facility] set for April 25.   ?Home medication regimen includes Flomax 0.4 mg twice daily and Proscar 5 mg daily. ?A voiding trial was attempted by orthopedics which the patient failed.  Foley catheter was reinserted.  Continue with Flomax. ?It looks like patient has been started on cephalexin by  orthopedics after discussions with urology.   ? ?Normocytic anemia ?Mild drop in hemoglobin is noted.  No overt bleeding appreciated.  Hemoglobin has been stable.  We will recheck labs tomorrow ? ?Cholelithiasis ?Incidentally noted on CT scan.  Asymptomatic.  LFTs reviewed. ? ?Right leg DVT ([place]) ?See under acute PE. ? ? ?Obesity ?Estimated body mass index is 31.78 kg/m? as calculated from the following: ?  Height as of this encounter: 5\' 8"  (1.727 m). ?  Weight as of this encounter: 94.8 kg. ? ? ?DVT Prophylaxis: On Apixaban ?Code Status: Full code ?Family Communication: Discussed with patient ?Disposition Plan: Skilled nursing facility versus CIR ? ?Status is: Inpatient ?Remains inpatient appropriate because: Acute PE and DVT, significant pain in the right lower extremity limiting mobility ? ? ? ? ? ? ?Medications: Scheduled: ? apixaban  10 mg Oral BID  ? Followed by  ? [START ON 09/05/2021] apixaban  5 mg Oral BID  ? bisoprolol  10 mg Oral Daily  ? cephALEXin  500 mg Oral BID  ? finasteride  5 mg Oral Daily  ? polyethylene glycol  17 g Oral Daily  ? senna-docusate  2 tablet Oral BID  ? sodium chloride flush  3 mL Intravenous Q12H  ? tamsulosin  0.4 mg Oral BID  ? ?Continuous: ?[id number] **OR** acetaminophen, albuterol, methocarbamol, ondansetron **OR** ondansetron (ZOFRAN) IV, oxyCODONE ? ?Antibiotics: ?Anti-infectives (From admission, onward)  ? ? Start     Dose/Rate Route Frequency Ordered Stop  ? 09/02/21 1000  cephALEXin (KEFLEX) capsule 500 mg       ? 500 mg Oral 2 times daily 09/02/21 0849    ? ?  ? ? ?Objective: ? ?Vital Signs ? ?Vitals:  ? 09/02/21 1537 09/02/21 2133 09/03/21 0602 09/03/21 0747  ?BP: 139/72 133/71 122/68 (!) 141/60  ?Pulse: 66 65 63 64  ?Resp: 17  15   ?Temp: 98.6 ?F (37 ?C) 98.5 ?F (36.9 ?C) 98.4 ?F (36.9 ?C) 98 ?F (36.7 ?C)  ?TempSrc: Oral Oral Oral Oral  ?SpO2: 98% 97% 96% 95%  ?Weight:      ?Height:      ? ? ?Intake/Output Summary (Last 24 hours) at 09/03/2021 0901 ?Last data  filed at 09/03/2021 0500 ?Gross per 24 hour  ?Intake 240 ml  ?Output 1700 ml  ?Net -1460 ml  ? ? ?Filed Weights  ? 08/29/21 1210  ?Weight: 94.8 kg  ? ? ?General appearance: Awake alert.  In no distress ?Resp: Clear to auscultation bilaterally.  Normal effort ?Cardio: S1-S2 is normal regular.  No S3-S4.  No rubs murmurs or bruit ?GI: Abdomen is soft.  Nontender nondistended.  Bowel sounds are present normal.  No masses organomegaly ?Extremities: Swollen right lower extremity noted ?Neurologic: Alert and oriented x3.  No focal neurological deficits.  ? ? ? ?Lab Results: ? ?Data Reviewed: I have personally reviewed labs and imaging study reports ? ?CBC: ?Recent Labs  ?Lab 08/29/21 ?0704 08/30/21 ?0421 08/31/21 ?0151 09/01/21 ?0240  ?WBC 11.6* 10.0 10.6* 10.1  ?HGB 11.3* 10.0* 10.3* 10.3*  ?HCT 34.4* 30.7* 30.6* 30.5*  ?MCV 90.8 90.3 88.4  87.9  ?PLT 231 225 238 265  ? ? ? ?Basic Metabolic Panel: ?Recent Labs  ?Lab 08/29/21 ?0704 08/30/21 ?0421 08/31/21 ?0151 09/01/21 ?[id number] 09/02/21 ?0136  ?NA 135 137 133* 132* 135  ?K 4.5 3.8 4.3 3.8 3.9  ?CL 104 109 103 103 105  ?CO2 23 22 24 23 23   ?GLUCOSE 123* 97 102* 110* 130*  ?BUN 23 27* 23 22 22   ?CREATININE 1.26* 1.15 1.12 1.28* 1.19  ?CALCIUM 8.4* 7.8* 8.1* 8.2* 8.3*  ? ? ? ?GFR: ?Estimated Creatinine Clearance: 57.2 mL/min (by C-G formula based on SCr of 1.19 mg/dL). ? ?Liver Function Tests: ?Recent Labs  ?Lab 08/30/21 ?0421 08/31/21 ?0151 09/01/21 ?0240  ?AST 36 34 33  ?ALT 47* 53* 54*  ?ALKPHOS [id number]  ?BILITOT 1.3* 1.2 0.9  ?PROT 4.9* 5.3* 5.4*  ?ALBUMIN 2.2* 2.3* 2.3*  ? ? ? ? ?Radiology Studies: ?No results found. ? ? ? ? LOS: 4 days  ? ?[name] [name] ? ?[facility] ?Pager on [email or web address] ? ?09/03/2021, 9:01 AM ? ? ?

## 2021-09-03 NOTE — Plan of Care (Signed)
  Problem: Activity: Goal: Risk for activity intolerance will decrease Outcome: Progressing   Problem: Nutrition: Goal: Adequate nutrition will be maintained Outcome: Progressing   Problem: Coping: Goal: Level of anxiety will decrease Outcome: Progressing   Problem: Elimination: Goal: Will not experience complications related to bowel motility Outcome: Progressing   

## 2021-09-04 ENCOUNTER — Encounter: Payer: Medicare Other | Admitting: Orthopaedic Surgery

## 2021-09-04 LAB — BASIC METABOLIC PANEL
Anion gap: 7 (ref 5–15)
BUN: 23 mg/dL (ref 8–23)
CO2: 25 mmol/L (ref 22–32)
Calcium: 8.3 mg/dL — ABNORMAL LOW (ref 8.9–10.3)
Chloride: 104 mmol/L (ref 98–111)
Creatinine, Ser: 1.29 mg/dL — ABNORMAL HIGH (ref 0.61–1.24)
GFR, Estimated: 57 mL/min — ABNORMAL LOW (ref >60)
Glucose, Bld: 125 mg/dL — ABNORMAL HIGH (ref 70–99)
Potassium: 4.2 mmol/L (ref 3.5–5.1)
Sodium: 136 mmol/L (ref 135–145)

## 2021-09-04 LAB — CBC
HCT: 32 % — ABNORMAL LOW (ref 39.0–52.0)
Hemoglobin: 10.5 g/dL — ABNORMAL LOW (ref 13.0–17.0)
MCH: 28.8 pg (ref 26.0–34.0)
MCHC: 32.8 g/dL (ref 30.0–36.0)
MCV: 87.9 fL (ref 80.0–100.0)
Platelets: 341 10*3/uL (ref 150–400)
RBC: 3.64 MIL/uL — ABNORMAL LOW (ref 4.22–5.81)
RDW: 13.3 % (ref 11.5–15.5)
WBC: 10.9 10*3/uL — ABNORMAL HIGH (ref 4.0–10.5)
nRBC: 0 % (ref 0.0–0.2)

## 2021-09-04 NOTE — Progress Notes (Signed)
Occupational Therapy Treatment ?Patient Details ?Name: [name] [name] [name]. ?MRN: [id number] ?DOB: 09/14/1940 ?Today's Date: 09/04/2021 ? ? ?History of present illness [name] [name] [name] is a 81 y.o. male presenting to [facility] ED 08/29/21 with severe calf [name] [name], [name] [name] [name], [name]. Imaging (+) for small acute PE in R upper lobe [name] DVT in R calf. 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 PMH includes HTN, CKD, nephrolithiasis, [name] back surgery. ?  ?OT comments ? Focus of session on LB bathing [name] dressing. Continues to requires moderate assistance with limited R knee flexion [name] reported [name] [name] with touch to calf. Pt stood for pressure relief from chair with min guard assist [name] RW. Continues to have difficulty placing weight through R [name] due to [name] [name]. Updated d/c recommendation to SNF as pt is making slow gains.   ? ?Recommendations for follow up therapy are one component of a multi-disciplinary discharge planning process, led by the attending physician.  Recommendations may be updated based on patient status, additional functional criteria [name] insurance authorization. ?   ?Follow Up Recommendations ? Skilled nursing-short term rehab (<3 hours/day)  ?  ?Assistance Recommended at Discharge Frequent or constant Supervision/Assistance  ?Patient can return home with the following ? A little help with walking [name]/or transfers;A little help with bathing/dressing/bathroom;Assistance with cooking/housework;Direct supervision/assist for medications management;Assist for transportation;Help with stairs or ramp for entrance ?  ?Equipment Recommendations ? None recommended by OT  ?  ?Recommendations for Other Services   ? ?  ?Precautions / Restrictions Precautions ?Precautions: Knee;Fall ?Precaution Booklet Issued: No ?Required Braces or Orthoses: Other Brace ?Other Brace: missing a piece from his AFO, [employer] notified on 08/31/21 ?Restrictions ?Weight Bearing Restrictions: Yes ?RLE Weight Bearing: Weight bearing as tolerated  ? ? ?  ? ?Mobility  Bed Mobility ?  ?  ?  ?  ?  ?  ?  ?General bed mobility comments: seated in chair ?  ? ?Transfers ?Overall transfer level: Needs assistance ?Equipment used: Rolling walker (2 wheels) ?Transfers: Sit to/from Stand ?Sit to Stand: Min guard ?  ?  ?  ?  ?  ?General transfer comment: min guard A to rise [name] steady for safety. Pt not placing weight through R [name] due to calf [name] [name] ?  ?  ?Balance Overall balance assessment: Needs assistance ?Sitting-balance support: No upper extremity supported, Feet supported ?Sitting balance-Leahy Scale: Good ?Sitting balance - Comments: no LOB leaning forward toward feet ?  ?  ?Standing balance-Leahy Scale: Poor ?Standing balance comment: Reliant on BUE with RW with min guard assist ?  ?  ?  ?  ?  ?  ?  ?  ?  ?  ?  ?   ? ?ADL either performed or assessed with clinical judgement  ? ?ADL Overall ADL's : Needs assistance/impaired ?  ?  ?  ?  ?  ?  ?Lower Body Bathing: Sitting/lateral leans;Moderate assistance ?  ?  ?  ?Lower Body Dressing: Sitting/lateral leans;Moderate assistance ?  ?  ?  ?  ?  ?  ?  ?  ?  ?  ? ?Extremity/Trunk Assessment   ?  ?  ?  ?  ?  ? ?Vision   ?  ?  ?Perception   ?  ?Praxis   ?  ? ?Cognition Arousal/Alertness: Awake/alert ?Behavior During Therapy: [facility] for tasks assessed/performed ?Overall Cognitive Status: Within Functional Limits for tasks assessed ?  ?  ?  ?  ?  ?  ?  ?  ?  ?  ?  ?  ?  ?  ?  ?  ?  ?  ?  ?   ?  Exercises   ? ?  ?Shoulder Instructions   ? ? ?  ?General Comments Pt asymptomatic today with mobility, just painful in R calf  ? ? ?Pertinent Vitals/ [name] [name]       [name] [name] Assessment ?[name] [name] Assessment: Faces ?Faces [name] [name] Scale: Hurts even more ?[name] [name] Location: R calf ?[name] [name] Descriptors / Indicators: Grimacing, Discomfort ?[name] [name] Intervention(s): Monitored during session, Repositioned ? ?Home Living   ?  ?  ?  ?  ?  ?  ?  ?  ?  ?  ?  ?  ?  ?  ?  ?  ?  ?  ? ?  ?Prior Functioning/Environment    ?  ?  ?  ?   ? ?Frequency ? Min 2X/week  ? ? ? ? ?  ?Progress Toward Goals ? ?OT  Goals(current goals can now be found in the care plan section) ? Progress towards OT goals: Progressing toward goals ? ?Acute Rehab OT Goals ?OT Goal Formulation: With patient ?Time For Goal Achievement: 09/14/21 ?Potential to Achieve Goals: Good  ?Plan Discharge plan needs to be updated   ? ?Co-evaluation ? ? ?   ?  ?  ?  ?  ? ?  ?AM-PAC OT "6 Clicks" Daily Activity     ?Outcome Measure ? ? Help from another person eating meals?: None ?Help from another person taking care of personal grooming?: A Little ?Help from another person toileting, which includes using toliet, bedpan, or urinal?: A Little ?Help from another person bathing (including washing, rinsing, drying)?: A Little ?Help from another person to put on [name] taking off regular upper body clothing?: A Little ?Help from another person to put on [name] taking off regular lower body clothing?: A Little ?6 Click Score: 19 ? ?  ?End of Session Equipment Utilized During Treatment: Rolling walker (2 wheels);Gait belt ? ?OT Visit Diagnosis: Unsteadiness on feet (R26.81);Other abnormalities of gait [name] mobility (R26.89);Muscle [name] [name] (generalized) (M62.81);[name] [name] ?[name] [name] - Right/Left: Right ?[name] [name] - part of body: Knee;Leg ?  ?Activity Tolerance Patient tolerated treatment well ?  ?Patient Left in chair;with call bell/phone within reach;with chair alarm set ?  ?Nurse Communication   ?  ? ?   ? ?Time: 0630-1601 ?OT Time Calculation (min): 21 min ? ?Charges: OT General Charges ?$OT Visit: 1 Visit ?OT Treatments ?$Self Care/Home Management : 8-22 mins ? ?Martie Round, OTR/L ?Acute Rehabilitation Services ?Pager: [phone] ?Office: [phone]  ? ?Evern Bio ?09/04/2021, 2:35 PM ?

## 2021-09-04 NOTE — Progress Notes (Signed)
?PROGRESS NOTE ? ?[name].  ?DOB: 03-Jul-1940  ?PCP: Lawerance Cruel, MD ?EB:[id number]  ?DOA: 08/29/2021 ? LOS: 5 days  ?Hospital Day: 7 ? ?Brief narrative: ?[name]. is a 81 y.o. male with PMH significant for HTN, HLD, CKD 2/3, BPH, nephrolithiasis who recently had right total knee replacement with Dr. Ninfa Linden on 4/4 following which he was placed on aspirin 81 mg twice daily for DVT prophylaxis. ?On the day of discharge on 4/7, Foley catheter was removed.   ?However on 4/11, patient presented again with urinary retention.  Foley catheter was inserted in the ED with drainage of 780 mL of urine.  He also mentioned generalized weakness at that time. ?Further work-up showed an acute DVT in the right lower extremity and small pulm embolism on CT angio chest. ?Admitted to hospital service and started on heparin drip. ? ?Subjective: ?Patient was seen and examined morning.  Pleasant elderly Caucasian male.  Lying down on bed.  Not in physical distress. ?Waiting for SNF ? ?Principal Problem: ?  Acute pulmonary embolus and right leg DVT status post total right knee replacement ?Active Problems: ?  Status post total knee replacement, right ?  Chronic kidney disease, stage IIIa (moderate) (HCC) ?  Leukocytosis ?  Essential hypertension ?  Normocytic anemia ?  BPH with urinary obstruction ?  Right leg DVT ([place]) ?  Cholelithiasis ?  Acute pulmonary embolism ([place]) ?  ? ?Assessment and Plan: ?Acute pulmonary embolus  ?right leg DVT following right TKA  ?-Underwent TKA on 4/4.  Was on aspirin twice daily for DVT prophylaxis  ?-Presented with right lower extremity acute DVT as well as right upper lobe pulm embolism. ?-Initially treated with heparin drip.  Subsequently switched to Eliquis. ?-No evidence for right heart strain was noted on CT angiogram.   ?-Patient is tolerating his anticoagulation well. ? ?Acute urinary retention ?BPH ?-Foley catheter inserted in the ED, drained 780 mL of urine. ?-Voiding trial was  attempted this hospitalization, failed.  Foley catheter reinserted ?-Currently on Flomax 0.4 mg twice daily and Proscar 5 mg daily. ?-To follow-up with urology Dr. Junious Silk as an outpatient.  Has an appointment on 4/25. ?-It seems orthopedics Dr. Ninfa Linden and urologist Dr. Junious Silk had a conversation few days ago.  Patient was recommended to be on antibiotics.  Currently on Keflex 500 mg twice daily. ?  ?Chronic kidney disease, stage IIIa ?Renal function close to baseline.   ?Recent Labs  ?  08/14/21 ?0816 08/23/21 ?[id number] 08/29/21 ?0704 08/30/21 ?0421 08/31/21 ?0151 09/01/21 ?[id number] 09/02/21 ?0136 09/04/21 ?0224  ?BUN 32* 24* 23 27* 23 22 22 23   ?CREATININE 1.31* 1.22 1.26* 1.15 1.12 1.28* 1.19 1.29*  ? ?Impaired mobility  ?recent right total knee replacement ?-PT eval obtained.  SNF recommended.   ?-Currently pain controlled with home oxycodone as needed, Tylenol as needed. ?  ?Essential hypertension ?Noted to be on bisoprolol-HCTZ and Avapro prior to admission.  ?His HCTZ and Avapro were held due to elevated creatinine.  Blood pressure is reasonably well controlled. ?Continue bisoprolol only for now.   ?   ?Cholelithiasis ?-Incidentally noted on CT scan.  Asymptomatic.  LFTs reviewed. ?  ?Goals of care ?  Code Status: Full Code  ? ? ?Mobility: Encourage ambulation ? ?Skin assessment:  ?  ? ?Nutritional status:  ?Body mass index is 31.78 kg/m?.  ?  ?  ? ? ? ? ?Diet:  ?Diet Order   ? ?       ?  Diet  Heart Room service appropriate? Yes; Fluid consistency: Thin  Diet effective now       ?  ? ?  ?  ? ?  ? ? ?DVT prophylaxis:  ? ?apixaban (ELIQUIS) tablet 10 mg  ?apixaban (ELIQUIS) tablet 5 mg   ?Antimicrobials: None ?Fluid: None ?Consultants: Orthopedics ?Family Communication: None at bedside ? ?Status is: Inpatient ? ?Continue in-hospital care because: Pending SNF ?Level of care: Telemetry Medical  ? ?Dispo: The patient is from: Home ?             Anticipated d/c is to: SNF ?             Patient currently is not  medically stable to d/c. ?  Difficult to place patient No ? ? ? ? ?Infusions:  ? ? ?Scheduled Meds: ? apixaban  10 mg Oral BID  ? Followed by  ? [START ON 09/05/2021] apixaban  5 mg Oral BID  ? bisoprolol  10 mg Oral Daily  ? cephALEXin  500 mg Oral BID  ? finasteride  5 mg Oral Daily  ? polyethylene glycol  17 g Oral Daily  ? senna-docusate  2 tablet Oral BID  ? sodium chloride flush  3 mL Intravenous Q12H  ? tamsulosin  0.4 mg Oral BID  ? ? ?PRN meds: ?acetaminophen **OR** acetaminophen, albuterol, methocarbamol, ondansetron **OR** ondansetron (ZOFRAN) IV, oxyCODONE  ? ?Antimicrobials: ?Anti-infectives (From admission, onward)  ? ? Start     Dose/Rate Route Frequency Ordered Stop  ? 09/02/21 1000  cephALEXin (KEFLEX) capsule 500 mg       ? 500 mg Oral 2 times daily 09/02/21 0849    ? ?  ? ? ?Objective: ?Vitals:  ? 09/04/21 0918 09/04/21 1451  ?BP: 128/75 (!) 142/77  ?Pulse: 71 60  ?Resp: 17 18  ?Temp: 98.2 ?F (36.8 ?C) 98.3 ?F (36.8 ?C)  ?SpO2: 98% 100%  ? ? ?Intake/Output Summary (Last 24 hours) at 09/04/2021 1546 ?Last data filed at 09/04/2021 0835 ?Gross per 24 hour  ?Intake 360 ml  ?Output 600 ml  ?Net -240 ml  ? ?Filed Weights  ? 08/29/21 1210  ?Weight: 94.8 kg  ? ?Weight change:  ?Body mass index is 31.78 kg/m?.  ? ?Physical Exam: ?General exam: Pleasant, elderly Caucasian male.  Not in physical distress ?Skin: No rashes, lesions or ulcers. ?HEENT: Atraumatic, normocephalic, no obvious bleeding ?Lungs: Clear to auscultation bilaterally ?CVS: Regular rate and rhythm, no murmur ?GI/Abd soft, nontender, nondistended, bowel sound present ?CNS: Alert, awake, oriented x3 ?Psychiatry: Mood appropriate ?Extremities: No pedal edema, no calf tenderness ? ?Data Review: I have personally reviewed the laboratory data and studies available. ? ?F/u labs ordered ?Unresulted Labs (From admission, onward)  ? ?  Start     Ordered  ? 09/05/21 0500  CBC with Differential/Platelet  Tomorrow morning,   R       ?Question:  Specimen  collection method  Answer:  Lab=Lab collect  ? 09/04/21 1541  ? 09/05/21 [id number]  Basic metabolic panel  Tomorrow morning,   R       ?Question:  Specimen collection method  Answer:  Lab=Lab collect  ? 09/04/21 1541  ? ?  ?  ? ?  ? ? ?Signed, ?Terrilee Croak, MD ?[facility] ?09/04/2021 ? ? ? ? ? ? ? ? ? ? ? ? ?

## 2021-09-04 NOTE — TOC Initial Note (Addendum)
Transition of Care (TOC) - Initial/Assessment Note  ? ? ?Patient Details  ?Name: [name] Heninger Jr. ?MRN: [id number] ?Date of Birth: 05/23/1940 ? ?Transition of Care (TOC) CM/SW Contact:    ?Cyrus P Kolar, LCSW ?Phone Number: ?09/04/2021, 11:21 AM ? ?Clinical Narrative:                 ? ?CSW met with pt to discuss disposition. Explained recommendations for rehab at SNF. Pt currently lives in a home with his spouse in [place]. States that his wife has been researching SNFs online and they prefer [facility] in [place]. CSW explains SNF workup process and insurance auth process to which patient expresses understanding of. CSW completed fl2 and faxed bed requests in hub. Pt states he is covid vaccinated x3.  ? ?1412: SNF auth request submitted in [employer] portal.  ? ?Expected Discharge Plan: Skilled Nursing Facility ?Barriers to Discharge: Continued Medical Work up, SNF Pending bed offer, Insurance Authorization ? ? ?Patient Goals and CMS Choice ? Wants [facility] in [place] ?  ? ?Expected Discharge Plan and Services ?Expected Discharge Plan: Skilled Nursing Facility ?  ?  ?  ?Living arrangements for the past 2 months: Single Family Home ?                ?  ?  ?  ?  ?  ?  ?  ?  ?  ?  ? ?Prior Living Arrangements/Services ?Living arrangements for the past 2 months: Single Family Home ?Lives with:: Spouse ?  ?       ?  ?  ?  ?  ? ?Activities of Daily Living ?Home Assistive Devices/Equipment: Walker (specify type), Bedside commode/3-in-1, Eyeglasses ?ADL Screening (condition at time of admission) ?Patient's cognitive ability adequate to safely complete daily activities?: Yes ?Is the patient deaf or have difficulty hearing?: No ?Does the patient have difficulty seeing, even when wearing glasses/contacts?: No ?Does the patient have difficulty concentrating, remembering, or making decisions?: No ?Patient able to express need for assistance with ADLs?: Yes ?Does the patient have difficulty dressing or bathing?:  No ?Independently performs ADLs?: Yes (appropriate for developmental age) ?Does the patient have difficulty walking or climbing stairs?: Yes ?Weakness of Legs: Right ?Weakness of Arms/Hands: None ? ?Permission Sought/Granted ?  ?  ?   ?   ?   ?   ? ?Emotional Assessment ?Appearance:: Appears younger than stated age, Appears stated age ?Attitude/Demeanor/Rapport: Engaged ?Affect (typically observed): Accepting ?Orientation: : Oriented to Situation, Oriented to  Time, Oriented to Place, Oriented to Self ?Alcohol / Substance Use: Not Applicable ?Psych Involvement: No (comment) ? ?Admission diagnosis:  Pulmonary embolus (HCC) [I26.99] ?Other acute pulmonary embolism without acute cor pulmonale (HCC) [I26.99] ?Acute deep vein thrombosis (DVT) of calf muscle vein of right lower extremity (HCC) [I82.461] ?Acute pulmonary embolism (HCC) [I26.99] ?Patient Active Problem List  ? Diagnosis Date Noted  ? Cholelithiasis 08/30/2021  ? Acute pulmonary embolism (HCC) 08/30/2021  ? Acute pulmonary embolus and right leg DVT status post total right knee replacement 08/29/2021  ? Right leg DVT (HCC) 08/29/2021  ? Chronic kidney disease, stage IIIa (moderate) (HCC) 08/29/2021  ? Leukocytosis 08/29/2021  ? Normocytic anemia 08/29/2021  ? 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 Essential hypertension 08/29/2021  ? Status post total knee replacement, right 08/24/2021  ? Status post right knee replacement 08/22/2021  ? Unilateral primary osteoarthritis, right knee 10/19/2020  ? Facet joint disease of lumbosacral region 08/12/2017  ? Chronic bilateral low   back pain 06/24/2017  ? Status post arthroscopy of right knee 06/06/2017  ? Acute pain of right knee 09/10/2016  ? ?PCP:  Ross, Charles Alan, MD ?Pharmacy:   ?[id number] - [place], [place] - [address] ?[address] ?[place] [address] ?Phone: [phone] Fax: [phone] ? ? ? ? ?Social Determinants of Health (SDOH) Interventions ?   ? ?Readmission Risk Interventions ?   ? View : No data to display.  ?  ?  ?  ? ? ? ?

## 2021-09-04 NOTE — [place] FL2 (Addendum)
?[place] MEDICAID FL2 LEVEL OF CARE SCREENING TOOL  ?  ? ?IDENTIFICATION  ?Patient Name: ?[name] [name]. Birthdate: August 18, 1940 Sex: male Admission Date (Current Location): ?08/29/2021  ?[place] and [place] Number: ? [place] ?  Facility and Address:  ?[facility], [place] [address], [place], [place] [address] ?     Provider Number: ?[id number]  ?Attending Physician Name and Address:  ?Terrilee Croak, MD ? Relative Name and Phone Number:  ?Damichael, Proia (Spouse)   [phone] [facility] Phone) ?   ?Current Level of Care: ?Hospital Recommended Level of Care: ?[place] Prior Approval Number: ?  ? ?Date Approved/Denied: ?  PASRR Number: ?[id number] (under first name [place]) ? ?Discharge Plan: ?SNF ?  ? ?Current Diagnoses: ?Patient Active Problem List  ? Diagnosis Date Noted  ? Cholelithiasis 08/30/2021  ? Acute pulmonary embolism ([place]) 08/30/2021  ? Acute pulmonary embolus and right leg DVT status post total right knee replacement 08/29/2021  ? Right leg DVT ([place]) 08/29/2021  ? Chronic kidney disease, stage IIIa (moderate) ([place]) 08/29/2021  ? Leukocytosis 08/29/2021  ? Normocytic anemia 08/29/2021  ? 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 Essential hypertension 08/29/2021  ? Status post total knee replacement, right 08/24/2021  ? Status post right knee replacement 08/22/2021  ? Unilateral primary osteoarthritis, right knee 10/19/2020  ? Facet joint disease of lumbosacral region 08/12/2017  ? Chronic bilateral low back pain 06/24/2017  ? Status post arthroscopy of right knee 06/06/2017  ? Acute pain of right knee 09/10/2016  ? ? ?Orientation RESPIRATION BLADDER Height & Weight   ?  ?Self, Time, Situation, Place ? Normal Continent Weight: 209 lb (94.8 kg) ?Height:  5\' 8"  (172.7 cm)  ?BEHAVIORAL SYMPTOMS/MOOD NEUROLOGICAL BOWEL NUTRITION STATUS  ?    Continent Diet (See d/c summary)  ?AMBULATORY STATUS COMMUNICATION OF NEEDS Skin   ?Limited Assist Verbally Surgical wounds (Incision  right knee) ?  ?  ?  ?    ?     ?     ? ? ?Personal Care Assistance Level of Assistance  ?Bathing, Feeding, Dressing Bathing Assistance: Limited assistance ?Feeding assistance: Independent ?Dressing Assistance: Limited assistance ?   ? ?Functional Limitations Info  ?Sight, Hearing, Speech Sight Info: Impaired ?Hearing Info: Impaired ?Speech Info: Adequate  ? ? ?SPECIAL CARE FACTORS FREQUENCY  ?PT (By licensed PT), OT (By licensed OT)   ?  ?PT Frequency: 5x/week ?OT Frequency: 5x/week ?  ?  ?  ?   ? ? ?Contractures Contractures Info: Not present  ? ? ?Additional Factors Info  ?Code Status, Allergies Code Status Info: Full code ?Allergies Info: no known allergies ?  ?  ?  ?   ? ?Current Medications (09/04/2021):  This is the current hospital active medication list ?Current Facility-Administered Medications  ?Medication Dose Route Frequency Provider Last Rate Last Admin  ? acetaminophen (TYLENOL) tablet 650 mg  650 mg Oral Q6H PRN Norval Morton, MD   650 mg at 09/03/21 2110  ? Or  ? acetaminophen (TYLENOL) suppository 650 mg  650 mg Rectal Q6H PRN Fuller Plan A, MD      ? albuterol (PROVENTIL) (2.5 MG/3ML) 0.083% nebulizer solution 2.5 mg  2.5 mg Nebulization Q6H PRN Fuller Plan A, MD      ? apixaban (ELIQUIS) tablet 10 mg  10 mg Oral BID Bertis Ruddy, RPH   10 mg at 09/04/21 [id number]  ? Followed by  ? [START ON 09/05/2021] apixaban (ELIQUIS) tablet 5 mg  5 mg Oral  BID Bertis Ruddy, [facility]      ? bisoprolol (ZEBETA) tablet 10 mg  10 mg Oral Daily Bonnielee Haff, MD   10 mg at 09/04/21 0844  ? cephALEXin (KEFLEX) capsule 500 mg  500 mg Oral BID Mcarthur Rossetti, MD   500 mg at 09/04/21 [id number]  ? finasteride (PROSCAR) tablet 5 mg  5 mg Oral Daily Fuller Plan A, MD   5 mg at 09/04/21 [id number]  ? methocarbamol (ROBAXIN) tablet 500 mg  500 mg Oral Q6H PRN Fuller Plan A, MD   500 mg at 09/03/21 2110  ? ondansetron (ZOFRAN) tablet 4 mg  4 mg Oral Q6H PRN Norval Morton, MD      ? Or  ? ondansetron (ZOFRAN)  injection 4 mg  4 mg Intravenous Q6H PRN Fuller Plan A, MD      ? oxyCODONE (Oxy IR/ROXICODONE) immediate release tablet 5 mg  5 mg Oral Q4H PRN Fuller Plan A, MD   5 mg at 09/03/21 1846  ? polyethylene glycol (MIRALAX / GLYCOLAX) packet 17 g  17 g Oral Daily Bonnielee Haff, MD   17 g at 09/02/21 [id number]  ? senna-docusate (Senokot-S) tablet 2 tablet  2 tablet Oral BID Bonnielee Haff, MD   2 tablet at 09/03/21 2110  ? sodium chloride flush (NS) 0.9 % injection 3 mL  3 mL Intravenous Q12H Fuller Plan A, MD   3 mL at 09/04/21 0845  ? tamsulosin (FLOMAX) capsule 0.4 mg  0.4 mg Oral BID Fuller Plan A, MD   0.4 mg at 09/04/21 [id number]  ? ? ? ?Discharge Medications: ?Please see discharge summary for a list of discharge medications. ? ?Relevant Imaging Results: ? ?Relevant Lab Results: ? ? ?Additional Information ?SSN [id number] Pt is covid vaccinated x3 ? ?[place], LCSW ? ? ? ? ?

## 2021-09-04 NOTE — Care Management Important Message (Signed)
Important Message ? ?Patient Details  ?Name: [name] [name]. ?MRN: [id number] ?Date of Birth: 1941-03-20 ? ? ?Medicare Important Message Given:  Yes ? ? ? ? ?[name] ?09/04/2021, 3:30 PM ?

## 2021-09-04 NOTE — Progress Notes (Signed)
Physical Therapy Treatment ?Patient Details ?Name: [name] [name]. ?MRN: [id number] ?DOB: [birth year] ?Today's Date: 09/04/2021 ? ? ?History of Present Illness [name] [name] is a 81 y.o. male presenting to [facility] ED 08/29/21 with severe calf pain, LE weakness, and dizziness. Imaging (+) for small acute PE in R upper lobe and DVT in R calf. 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 PMH includes HTN, CKD, nephrolithiasis, and back surgery. ? ?  ?PT Comments  ? ? Patient is progressing slowly towards goals. Patient required min guard assist with functional mobility today while using RW and cues for pressing R heel towards floor in stance phase. Pt with 9/10 R calf pain and had difficulty tolerating increased ambulation distance this date. Pt educated on continuing stretching calf often to decrease stiffness and to modulate his pain. Acute PT continues to recommend SNF for pt safety and decrease burden of care on wife. Pt will benefit from continued skilled PT to increase his safety and independence with mobility tasks.  ?   ?Recommendations for follow up therapy are one component of a multi-disciplinary discharge planning process, led by the attending physician.  Recommendations may be updated based on patient status, additional functional criteria and insurance authorization. ? ?Follow Up Recommendations ? Follow physician's recommendations for discharge plan and follow up therapies ?  ?  ?Assistance Recommended at Discharge Frequent or constant Supervision/Assistance  ?Patient can return home with the following A little help with walking and/or transfers;A little help with bathing/dressing/bathroom;Assist for transportation;Help with stairs or ramp for entrance ?  ?Equipment Recommendations ? None recommended by PT  ?  ?Recommendations for Other Services   ? ? ?  ?Precautions / Restrictions Precautions ?Precautions: Knee;Fall ?Precaution Booklet Issued: No ?Required Braces or Orthoses: Other Brace ?Other Brace: missing a piece  from his AFO, [employer] notified on 08/31/21 ?Restrictions ?Weight Bearing Restrictions: Yes ?RLE Weight Bearing: Weight bearing as tolerated  ?  ? ?Mobility ? Bed Mobility ?Overal bed mobility: Needs Assistance ?  ?  ?  ?  ?  ?  ?General bed mobility comments: pt OOB in recliner upon entering room ?  ? ?Transfers ?Overall transfer level: Needs assistance ?Equipment used: Rolling walker (2 wheels) ?Transfers: Sit to/from Stand ?Sit to Stand: Min guard ?  ?  ?  ?  ?  ?General transfer comment: min guard A to rise and steady for safety. Pt not placing weight through R LE due to calf pain ?  ? ?Ambulation/Gait ?Ambulation/Gait assistance: Min guard ?Gait Distance (Feet): 12 Feet ?Assistive device: Rolling walker (2 wheels) ?Gait Pattern/deviations: Step-to pattern, Decreased stance time - right, Decreased stride length, Decreased step length - left ?Gait velocity: decreased ?Gait velocity interpretation: 1.31 - 2.62 ft/sec, indicative of limited community ambulator ?  ?General Gait Details: pt with slow gait requiring min guard A for safety and steadying. Pt required cues for postural awareness and bringing R heel as close to the ground as possible to stretch calf in stance phase. Pt with very high levels of calf pain in standing today that limited further mobility efforts. ? ? ?Stairs ?  ?  ?  ?  ?  ? ? ?Wheelchair Mobility ?  ? ?Modified Rankin (Stroke Patients Only) ?  ? ? ?  ?Balance Overall balance assessment: Needs assistance ?Sitting-balance support: No upper extremity supported, Feet supported ?Sitting balance-Leahy Scale: Good ?Sitting balance - Comments: pt supervision in sitting for safety, however, no physical assist needed ?  ?Standing balance support: Bilateral upper  extremity supported, During functional activity, Reliant on assistive device for balance ?Standing balance-Leahy Scale: Poor ?Standing balance comment: Reliant on BUE with RW with min guard for steadying in standing. ?  ?  ?  ?  ?  ?  ?  ?  ?  ?   ?  ?  ? ?  ?Cognition Arousal/Alertness: Awake/alert ?Behavior During Therapy: [facility] for tasks assessed/performed ?Overall Cognitive Status: Within Functional Limits for tasks assessed ?  ?  ?  ?  ?  ?  ?  ?  ?  ?  ?  ?  ?  ?  ?  ?  ?  ?  ?  ? ?  ?Exercises Total Joint Exercises ?Knee Flexion: 10 reps, AAROM, Right ? ?  ?General Comments General comments (skin integrity, edema, etc.): Pt asymptomatic today with mobility, just painful in R calf ?  ?  ? ?Pertinent Vitals/Pain Pain Assessment ?Pain Assessment: 0-10 ?Pain Score: 9  ?Pain Location: R calf ?Pain Descriptors / Indicators: Grimacing, Discomfort ?Pain Intervention(s): Monitored during session, Limited activity within patient's tolerance  ? ? ?Home Living   ?  ?  ?  ?  ?  ?  ?  ?  ?  ?   ?  ?Prior Function    ?  ?  ?   ? ?PT Goals (current goals can now be found in the care plan section) Acute Rehab PT Goals ?Patient Stated Goal: to get better ?PT Goal Formulation: With patient ?Time For Goal Achievement: 09/13/21 ?Potential to Achieve Goals: Good ?Progress towards PT goals: Progressing toward goals (slowly) ? ?  ?Frequency ? ? ? Min 3X/week ? ? ? ?  ?PT Plan Current plan remains appropriate  ? ? ?Co-evaluation   ?  ?  ?  ?  ? ?  ?AM-PAC PT "6 Clicks" Mobility   ?Outcome Measure ? Help needed turning from your back to your side while in a flat bed without using bedrails?: None ?Help needed moving from lying on your back to sitting on the side of a flat bed without using bedrails?: A Little ?Help needed moving to and from a bed to a chair (including a wheelchair)?: A Little ?Help needed standing up from a chair using your arms (e.g., wheelchair or bedside chair)?: A Little ?Help needed to walk in hospital room?: A Little ?Help needed climbing 3-5 steps with a railing? : A Lot ?6 Click Score: 18 ? ?  ?End of Session Equipment Utilized During Treatment: Gait belt ?Activity Tolerance: Patient limited by pain ?Patient left: in chair;with call bell/phone within  reach;with chair alarm set ?Nurse Communication: Mobility status;Patient requests pain meds ?PT Visit Diagnosis: Other abnormalities of gait and mobility (R26.89);Pain;Unsteadiness on feet (R26.81);Muscle weakness (generalized) (M62.81);Difficulty in walking, not elsewhere classified (R26.2) ?Pain - Right/Left: Right ?Pain - part of body: Leg ?  ? ? ?Time: 1130-1200 ?PT Time Calculation (min) (ACUTE ONLY): 30 min ? ?Charges:  $Gait Training: 23-37 mins          ?          ? ?Enis Slipper, SPT ? ? ?Enis Slipper ?09/04/2021, 1:35 PM ? ?

## 2021-09-05 DIAGNOSIS — Z466 Encounter for fitting and adjustment of urinary device: Secondary | ICD-10-CM | POA: Diagnosis not present

## 2021-09-05 DIAGNOSIS — Z96651 Presence of right artificial knee joint: Secondary | ICD-10-CM | POA: Diagnosis not present

## 2021-09-05 DIAGNOSIS — D508 Other iron deficiency anemias: Secondary | ICD-10-CM | POA: Diagnosis not present

## 2021-09-05 DIAGNOSIS — R54 Age-related physical debility: Secondary | ICD-10-CM | POA: Diagnosis not present

## 2021-09-05 DIAGNOSIS — M6281 Muscle weakness (generalized): Secondary | ICD-10-CM | POA: Diagnosis not present

## 2021-09-05 DIAGNOSIS — G8929 Other chronic pain: Secondary | ICD-10-CM | POA: Diagnosis not present

## 2021-09-05 DIAGNOSIS — E785 Hyperlipidemia, unspecified: Secondary | ICD-10-CM | POA: Diagnosis not present

## 2021-09-05 DIAGNOSIS — R6889 Other general symptoms and signs: Secondary | ICD-10-CM | POA: Diagnosis not present

## 2021-09-05 DIAGNOSIS — D649 Anemia, unspecified: Secondary | ICD-10-CM | POA: Diagnosis not present

## 2021-09-05 DIAGNOSIS — R509 Fever, unspecified: Secondary | ICD-10-CM | POA: Diagnosis not present

## 2021-09-05 DIAGNOSIS — I82401 Acute embolism and thrombosis of unspecified deep veins of right lower extremity: Secondary | ICD-10-CM | POA: Diagnosis not present

## 2021-09-05 DIAGNOSIS — R2681 Unsteadiness on feet: Secondary | ICD-10-CM | POA: Diagnosis not present

## 2021-09-05 DIAGNOSIS — H93299 Other abnormal auditory perceptions, unspecified ear: Secondary | ICD-10-CM | POA: Diagnosis not present

## 2021-09-05 DIAGNOSIS — R338 Other retention of urine: Secondary | ICD-10-CM | POA: Diagnosis not present

## 2021-09-05 DIAGNOSIS — R5381 Other malaise: Secondary | ICD-10-CM | POA: Diagnosis not present

## 2021-09-05 DIAGNOSIS — N1831 Chronic kidney disease, stage 3a: Secondary | ICD-10-CM | POA: Diagnosis not present

## 2021-09-05 DIAGNOSIS — D72829 Elevated white blood cell count, unspecified: Secondary | ICD-10-CM | POA: Diagnosis not present

## 2021-09-05 DIAGNOSIS — R197 Diarrhea, unspecified: Secondary | ICD-10-CM | POA: Diagnosis not present

## 2021-09-05 DIAGNOSIS — K8080 Other cholelithiasis without obstruction: Secondary | ICD-10-CM | POA: Diagnosis not present

## 2021-09-05 DIAGNOSIS — R0602 Shortness of breath: Secondary | ICD-10-CM | POA: Diagnosis not present

## 2021-09-05 DIAGNOSIS — I2699 Other pulmonary embolism without acute cor pulmonale: Secondary | ICD-10-CM | POA: Diagnosis not present

## 2021-09-05 DIAGNOSIS — N182 Chronic kidney disease, stage 2 (mild): Secondary | ICD-10-CM | POA: Diagnosis not present

## 2021-09-05 DIAGNOSIS — R339 Retention of urine, unspecified: Secondary | ICD-10-CM | POA: Diagnosis not present

## 2021-09-05 DIAGNOSIS — Z741 Need for assistance with personal care: Secondary | ICD-10-CM | POA: Diagnosis not present

## 2021-09-05 DIAGNOSIS — R531 Weakness: Secondary | ICD-10-CM | POA: Diagnosis not present

## 2021-09-05 DIAGNOSIS — I10 Essential (primary) hypertension: Secondary | ICD-10-CM | POA: Diagnosis not present

## 2021-09-05 DIAGNOSIS — I129 Hypertensive chronic kidney disease with stage 1 through stage 4 chronic kidney disease, or unspecified chronic kidney disease: Secondary | ICD-10-CM | POA: Diagnosis not present

## 2021-09-05 DIAGNOSIS — R300 Dysuria: Secondary | ICD-10-CM | POA: Diagnosis not present

## 2021-09-05 DIAGNOSIS — N200 Calculus of kidney: Secondary | ICD-10-CM | POA: Diagnosis not present

## 2021-09-05 DIAGNOSIS — M545 Low back pain, unspecified: Secondary | ICD-10-CM | POA: Diagnosis not present

## 2021-09-05 DIAGNOSIS — Z743 Need for continuous supervision: Secondary | ICD-10-CM | POA: Diagnosis not present

## 2021-09-05 LAB — CBC WITH DIFFERENTIAL/PLATELET
Abs Immature Granulocytes: 0.17 10*3/uL — ABNORMAL HIGH (ref 0.00–0.07)
Basophils Absolute: 0.1 10*3/uL (ref 0.0–0.1)
Basophils Relative: 1 %
Eosinophils Absolute: 0.5 10*3/uL (ref 0.0–0.5)
Eosinophils Relative: 4 %
HCT: 33.2 % — ABNORMAL LOW (ref 39.0–52.0)
Hemoglobin: 10.7 g/dL — ABNORMAL LOW (ref 13.0–17.0)
Immature Granulocytes: 2 %
Lymphocytes Relative: 15 %
Lymphs Abs: 1.6 10*3/uL (ref 0.7–4.0)
MCH: 28.7 pg (ref 26.0–34.0)
MCHC: 32.2 g/dL (ref 30.0–36.0)
MCV: 89 fL (ref 80.0–100.0)
Monocytes Absolute: 0.8 10*3/uL (ref 0.1–1.0)
Monocytes Relative: 8 %
Neutro Abs: 7.5 10*3/uL (ref 1.7–7.7)
Neutrophils Relative %: 70 %
Platelets: 368 10*3/uL (ref 150–400)
RBC: 3.73 MIL/uL — ABNORMAL LOW (ref 4.22–5.81)
RDW: 13.3 % (ref 11.5–15.5)
WBC: 10.6 10*3/uL — ABNORMAL HIGH (ref 4.0–10.5)
nRBC: 0 % (ref 0.0–0.2)

## 2021-09-05 LAB — BASIC METABOLIC PANEL
Anion gap: 8 (ref 5–15)
BUN: 24 mg/dL — ABNORMAL HIGH (ref 8–23)
CO2: 24 mmol/L (ref 22–32)
Calcium: 8.4 mg/dL — ABNORMAL LOW (ref 8.9–10.3)
Chloride: 99 mmol/L (ref 98–111)
Creatinine, Ser: 1.15 mg/dL (ref 0.61–1.24)
GFR, Estimated: >60 mL/min (ref >60)
Glucose, Bld: 99 mg/dL (ref 70–99)
Potassium: 4.1 mmol/L (ref 3.5–5.1)
Sodium: 131 mmol/L — ABNORMAL LOW (ref 135–145)

## 2021-09-05 MED ORDER — SENNOSIDES-DOCUSATE SODIUM 8.6-50 MG PO TABS: 2.0000 | ORAL_TABLET | Freq: Two times a day (BID) | ORAL | Status: DC

## 2021-09-05 MED ORDER — CHLORHEXIDINE GLUCONATE CLOTH 2 % EX PADS
6.0000 | MEDICATED_PAD | Freq: Every day | CUTANEOUS | Status: DC
  Administered 2021-09-05: 6 via TOPICAL

## 2021-09-05 MED ORDER — CEPHALEXIN 500 MG PO CAPS: 500.0000 mg | ORAL_CAPSULE | Freq: Two times a day (BID) | ORAL | Status: DC

## 2021-09-05 MED ORDER — OXYCODONE HCL 5 MG PO TABS: 5.0000 mg | ORAL_TABLET | Freq: Four times a day (QID) | ORAL | 0 refills | Status: DC | PRN

## 2021-09-05 MED ORDER — ALBUTEROL SULFATE (2.5 MG/3ML) 0.083% IN NEBU: 2.5000 mg | INHALATION_SOLUTION | Freq: Four times a day (QID) | RESPIRATORY_TRACT | 12 refills | Status: DC | PRN

## 2021-09-05 MED ORDER — OXYCODONE HCL 5 MG PO TABS: 5.0000 mg | ORAL_TABLET | Freq: Four times a day (QID) | ORAL | 0 refills | Status: AC | PRN

## 2021-09-05 MED ORDER — POLYETHYLENE GLYCOL 3350 17 G PO PACK
17.0000 g | PACK | Freq: Every day | ORAL | 0 refills | Status: DC
Start: 2021-09-05 — End: 2021-12-26

## 2021-09-05 MED ORDER — APIXABAN 5 MG PO TABS: 5.0000 mg | ORAL_TABLET | Freq: Two times a day (BID) | ORAL | Status: DC

## 2021-09-05 MED ORDER — BISOPROLOL FUMARATE 10 MG PO TABS: 10.0000 mg | ORAL_TABLET | Freq: Every day | ORAL | Status: DC

## 2021-09-05 MED ORDER — CEPHALEXIN 500 MG PO CAPS
500.0000 mg | ORAL_CAPSULE | Freq: Every day | ORAL | 0 refills | Status: AC
Start: 2021-09-05 — End: 2021-09-12

## 2021-09-05 NOTE — Progress Notes (Signed)
Pt. Verbalize understanding of discharge instruction. Report giving to [employer]. ?

## 2021-09-05 NOTE — Progress Notes (Signed)
Patient ID: [name] [name]., male   DOB: 08/02/1940, 81 y.o.   MRN: [id number] ?Is now 2 weeks since [name] had surgery on his right knee.  I did remove the staples and place Steri-Strips over the incision.  His knee looks good.  At short-term skilled nursing they can work on his balance and coordination as well as range of motion of that knee.  We will see him back in the office in about 2 weeks. ?

## 2021-09-05 NOTE — Progress Notes (Signed)
Physical Therapy Treatment ?Patient Details ?Name: [name] [name]. ?MRN: [id number] ?DOB: August 11, 1940 ?Today's Date: 09/05/2021 ? ? ?History of Present Illness [name] [name] is a 81 y.o. male presenting to [facility] ED 08/29/21 with severe calf pain, LE weakness, and dizziness. Imaging (+) for small acute PE in R upper lobe and DVT in R calf. 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 PMH includes HTN, CKD, nephrolithiasis, and back surgery. ? ?  ?PT Comments  ? ? Patient is progressing towards goals this session. Patient required supervision for bed mobility and min guard assist with transfers and ambulation while using RW. Pt with increased weight bearing tolerance and improved R heel contact during ambulation following manual gastroc-soleus stretching. He was able to take a few steps with improved step-through pattern with cues for large steps with left foot. Cues given to increase R hip extension to improve upright posture during ambulation.  PT still recommending SNF for pt safety and to decrease the burden of care on his wife. Pt will benefit from continued skilled PT to increase his safety and independence with mobility tasks. ?   ?Recommendations for follow up therapy are one component of a multi-disciplinary discharge planning process, led by the attending physician.  Recommendations may be updated based on patient status, additional functional criteria and insurance authorization. ? ?Follow Up Recommendations ? Follow physician's recommendations for discharge plan and follow up therapies ?  ?  ?Assistance Recommended at Discharge Frequent or constant Supervision/Assistance  ?Patient can return home with the following A little help with walking and/or transfers;A little help with bathing/dressing/bathroom;Assist for transportation;Help with stairs or ramp for entrance ?  ?Equipment Recommendations ? None recommended by PT  ?  ?Recommendations for Other Services   ? ? ?  ?Precautions / Restrictions Precautions ?Precautions:  Knee;Fall ?Precaution Booklet Issued: No ?Required Braces or Orthoses: Other Brace ?Other Brace: missing a piece from his AFO, [employer] notified on 08/31/21 ?Restrictions ?Weight Bearing Restrictions: Yes ?RLE Weight Bearing: Weight bearing as tolerated  ?  ? ?Mobility ? Bed Mobility ?Overal bed mobility: Needs Assistance ?Bed Mobility: Supine to Sit ?  ?  ?Supine to sit: Supervision ?  ?  ?General bed mobility comments: pt required supervision for safety to sit at EOB. ?  ? ?Transfers ?Overall transfer level: Needs assistance ?Equipment used: Rolling walker (2 wheels) ?Transfers: Sit to/from Stand ?Sit to Stand: Min guard ?  ?  ?  ?  ?  ?General transfer comment: min guard A to rise and steady for safety. Pt not placing weight through R LE due to calf pain ?  ? ?Ambulation/Gait ?Ambulation/Gait assistance: Min guard ?Gait Distance (Feet): 30 Feet ?Assistive device: Rolling walker (2 wheels) ?Gait Pattern/deviations: Decreased stride length, Decreased stance time - right, Decreased step length - left, Decreased dorsiflexion - right, Knee flexed in stance - right ?Gait velocity: decreased ?Gait velocity interpretation: 1.31 - 2.62 ft/sec, indicative of limited community ambulator ?Pre-gait activities: SPT instructed pt in hip extension, thus bringing R foot underneath patient in standing and to improve upright posture with glute activation. Pt educated on weight shifting, slowly progressing to increased weight through RLE to stretch out calf in standing. Weightshifts performed x6 reps to each side ?General Gait Details: pt with slow gait requiring min guard A for safety and steadying. Pt required cues for postural awareness, R hip extension, and bringing R heel as close to the ground as possible to stretch calf in stance phase. Pt progressed today to improved R  heel contact in stance phase and less calf pain with ambulation. Pt cued for short R step length and step through pattern with L foot. ? ? ?Stairs ?  ?  ?  ?  ?   ? ? ?Wheelchair Mobility ?  ? ?Modified Rankin (Stroke Patients Only) ?  ? ? ?  ?Balance Overall balance assessment: Needs assistance ?Sitting-balance support: No upper extremity supported, Feet supported ?Sitting balance-Leahy Scale: Good ?  ?  ?Standing balance support: Bilateral upper extremity supported, During functional activity, Reliant on assistive device for balance ?Standing balance-Leahy Scale: Poor ?Standing balance comment: Reliant on BUE with RW with min guard assist. Pt pra ?  ?  ?  ?  ?  ?  ?  ?  ?  ?  ?  ?  ? ?  ?Cognition Arousal/Alertness: Awake/alert ?[facility] for tasks assessed/performed ?Overall Cognitive Status: Within Functional Limits for tasks assessed ?  ?  ?  ?  ?  ?  ?  ?  ?  ?  ?  ?  ?  ?  ?  ?  ?  ?  ?  ? ?  ?Exercises Total Joint Exercises ?Heel Slides: 10 reps, Right, AAROM, Supine ?Other Exercises ?Other Exercises: supine gastroc stretch 3x10 seconds - manual stretching improved weight bearing tolerance in standing ?Other Exercises: supine soleus stretch 3x10 seconds - manual stretching improved weightbearing tolerance in standing ? ?  ?General Comments General comments (skin integrity, edema, etc.): Pt asymptomatic today with mobility, just painful in R calf. ?  ?  ? ?Pertinent Vitals/Pain Pain Assessment ?Pain Assessment: Faces ?Faces Pain Scale: Hurts whole lot ?Pain Location: R calf ?Pain Descriptors / Indicators: Grimacing, Discomfort ?Pain Intervention(s): Monitored during session  ? ? ?Home Living   ?  ?  ?  ?  ?  ?  ?  ?  ?  ?   ?  ?Prior Function    ?  ?  ?   ? ?PT Goals (current goals can now be found in the care plan section) Acute Rehab PT Goals ?Patient Stated Goal: to get better ?PT Goal Formulation: With patient ?Time For Goal Achievement: 09/13/21 ?Potential to Achieve Goals: Good ?Progress towards PT goals: Progressing toward goals ? ?  ?Frequency ? ? ? Min 3X/week ? ? ? ?  ?PT Plan Current plan remains appropriate  ? ? ?Co-evaluation   ?  ?  ?   ?  ? ?  ?AM-PAC PT "6 Clicks" Mobility   ?Outcome Measure ? Help needed turning from your back to your side while in a flat bed without using bedrails?: None ?Help needed moving from lying on your back to sitting on the side of a flat bed without using bedrails?: A Little ?Help needed moving to and from a bed to a chair (including a wheelchair)?: A Little ?Help needed standing up from a chair using your arms (e.g., wheelchair or bedside chair)?: A Little ?Help needed to walk in hospital room?: A Little ?Help needed climbing 3-5 steps with a railing? : A Lot ?6 Click Score: 18 ? ?  ?End of Session Equipment Utilized During Treatment: Gait belt ?Activity Tolerance: Patient limited by pain ?Patient left: Other (comment) (on EMS stretcher ready for transport to SNF.) ?Nurse Communication: Mobility status ?PT Visit Diagnosis: Other abnormalities of gait and mobility (R26.89);Pain;Unsteadiness on feet (R26.81);Muscle weakness (generalized) (M62.81);Difficulty in walking, not elsewhere classified (R26.2) ?Pain - Right/Left: Right ?Pain - part of body: Leg ?  ? ? ?  Time: 4650-3546 ?PT Time Calculation (min) (ACUTE ONLY): 29 min ? ?Charges:  $Gait Training: 8-22 mins ?$Therapeutic Exercise: 8-22 mins          ?          ? ?Enis Slipper, SPT ? ? ?Enis Slipper ?09/05/2021, 2:55 PM ? ?

## 2021-09-05 NOTE — Discharge Summary (Signed)
? ?Physician Discharge Summary  ?[name] [name]. [id number] DOB: 07-Apr-1941 DOA: 08/29/2021 ? ?PCP: Lawerance Cruel, MD ? ?Admit date: 08/29/2021 ?Discharge date: 09/05/2021 ? ?Admitted From: Home ?Discharge disposition: SNF ? ?Recommendations at discharge:  ?You have been started on Eliquis which is a blood thinner for DVT/PE.  Watch out for any excessive bruising, bleeding or black stool. ?Follow-up with urologist Dr. Junious Silk on 4/25 at 8:15 AM ? ? ?Brief narrative: ?[name] [name]. is a 81 y.o. male with PMH significant for HTN, HLD, CKD 2/3, BPH, nephrolithiasis who recently had right total knee replacement with Dr. Ninfa Linden on 4/4 following which he was placed on aspirin 81 mg twice daily for DVT prophylaxis. ?On the day of discharge on 4/7, Foley catheter was removed.   ?However on 4/11, patient presented again with urinary retention.  Foley catheter was inserted in the ED with drainage of 780 mL of urine.  He also mentioned generalized weakness at that time. ?Further work-up showed an acute DVT in the right lower extremity and small pulm embolism on CT angio chest. ?Admitted to hospital service and started on heparin drip. ? ?Subjective: ?Patient was seen and examined morning.  Pleasant elderly Caucasian male.  Lying down on bed.  Not in physical distress. ?Principal Problem: ?  Acute pulmonary embolus and right leg DVT status post total right knee replacement ?Active Problems: ?  Status post total knee replacement, right ?  Chronic kidney disease, stage IIIa (moderate) (HCC) ?  Leukocytosis ?  Essential hypertension ?  Normocytic anemia ?  BPH with urinary obstruction ?  Right leg DVT ([place]) ?  Cholelithiasis ?  Acute pulmonary embolism ([place]) ?  ? ?Hospital course: ?Acute pulmonary embolus  ?right leg DVT following right TKA  ?-Underwent TKA on 4/4.  Was on aspirin twice daily for DVT prophylaxis  ?-Presented with right lower extremity acute DVT as well as right upper lobe pulm embolism. ?-Initially  treated with heparin drip.  Subsequently switched to Eliquis. ?-No evidence for right heart strain was noted on CT angiogram.   ?-Patient is tolerating his anticoagulation well. ? ?Acute urinary retention ?BPH ?-Foley catheter inserted in the ED, drained 780 mL of urine. ?-Voiding trial was attempted this hospitalization, failed.  Foley catheter reinserted ?-Currently on Flomax 0.4 mg twice daily and Proscar 5 mg daily. ?-To follow-up with urology Dr. Junious Silk as an outpatient.  Has an appointment on 4/25. ?-I had a conversation with urologist Dr. Junious Silk today.  He recommended to continue Keflex nightly for 7 days.  Patient has an appointment with him on 4/25 at 8:15 AM. ?  ?Chronic kidney disease, stage IIIa ?-Renal function close to baseline.   ?Recent Labs  ?  08/14/21 ?0816 08/23/21 ?[id number] 08/29/21 ?0704 08/30/21 ?0421 08/31/21 ?0151 09/01/21 ?0240 09/02/21 ?0136 09/04/21 ?0224 09/05/21 ?0200  ?BUN 32* 24* 23 27* 23 22 22 23  24*  ?CREATININE 1.31* 1.22 1.26* 1.15 1.12 1.28* 1.19 1.29* 1.15  ? ?Impaired mobility  ?recent right total knee replacement ?-PT eval obtained.  SNF recommended.   ?-Currently pain controlled with home oxycodone as needed, Tylenol as needed. ?  ?Essential hypertension ?Noted to be on bisoprolol-HCTZ and Avapro prior to admission.  ?His HCTZ and Avapro were held due to elevated creatinine.  Blood pressure is reasonably well controlled. ?Continue bisoprolol only for now.  Continue to hold HCTZ and Avapro at discharge. ?   ?Cholelithiasis ?-Incidentally noted on CT scan.  Asymptomatic.  LFTs reviewed. ?  ?Goals of care ?  Code Status: Full Code  ? ? ?Mobility: Encourage ambulation ? ?Skin assessment:  ?  ? ?Nutritional status:  ?Body mass index is 31.78 kg/m?.  ?  ?  ? ? ? ? ?Wounds:  ?- ?Incision 05/26/13 Groin Right (Active)  ?Date First Assessed/Time First Assessed: 05/26/13 0915   Location: Groin  Location Orientation: Right  ?  ?Assessments 05/26/2013 11:05 AM 05/26/2013 12:40 PM  ?Dressing  Type Liquid skin adhesive Liquid skin adhesive  ?Dressing Clean;Dry;Intact Clean;Dry;Intact  ?   ?No Linked orders to display  ?   ?Incision (Closed) 08/22/21 Knee Right (Active)  ?Date First Assessed/Time First Assessed: 08/22/21 1103   Location: Knee  Location Orientation: Right  ?  ?Assessments 08/22/2021 11:30 AM 09/05/2021  9:36 AM  ?Dressing Type Compression wrap Adhesive strips  ?Dressing Clean, Dry, Intact Clean, Dry, Intact  ?Site / Wound Assessment Dressing in place / Unable to assess --  ?Drainage Amount None --  ?   ?No Linked orders to display  ? ? ?Discharge Exam:  ? ?Vitals:  ? 09/04/21 1451 09/04/21 2025 09/05/21 0422 09/05/21 0800  ?BP: (!) 142/77 (!) 145/85 136/79 128/73  ?Pulse: 60 65 65 60  ?Resp: 18 20 18 17   ?Temp: 98.3 ?F (36.8 ?C) 98.6 ?F (37 ?C) 97.9 ?F (36.6 ?C) 98.2 ?F (36.8 ?C)  ?TempSrc: Oral Oral  Oral  ?SpO2: 100% 100% 97% 97%  ?Weight:      ?Height:      ? ? ?Body mass index is 31.78 kg/m?.  ?General exam: Pleasant, elderly Caucasian male.  Not in physical distress ?Skin: No rashes, lesions or ulcers. ?HEENT: Atraumatic, normocephalic, no obvious bleeding ?Lungs: Clear to auscultation bilaterally ?CVS: Regular rate and rhythm, no murmur ?GI/Abd soft, nontender, nondistended, bowel sound present ?CNS: Alert, awake, oriented x3 ?Psychiatry: Mood appropriate ?Extremities: No pedal edema, no calf tenderness ? ?Follow ups:  ? ? Contact information for follow-up providers   ? ? Lawerance Cruel, MD Follow up.   ?Specialty: Family Medicine ?Contact information: ?[address] ?[place] [address] ?[phone] ? ? ?  ?  ? ? Mcarthur Rossetti, MD. Schedule an appointment as soon as possible for a visit in 2 week(s).   ?Specialty: Orthopedic Surgery ?Contact information: ?[address] ?[place] [address] ?[phone] ? ? ?  ?  ? ?  ?  ? ? Contact information for after-discharge care   ? ? Destination   ? ? [facility] .    ?Service: Skilled Nursing ?Contact information: ?[address] ?[place] ?[phone] ? ?  ?  ? ?  ?  ? ?  ?  ? ?  ? ? ?Discharge Instructions:  ? ?Discharge Instructions   ? ? Call MD for:  difficulty breathing, headache or visual disturbances   Complete by: As directed ?  ? Call MD for:  extreme fatigue   Complete by: As directed ?  ? Call MD for:  hives   Complete by: As directed ?  ? Call MD for:  persistant dizziness or light-headedness   Complete by: As directed ?  ? Call MD for:  persistant nausea and vomiting   Complete by: As directed ?  ? Call MD for:  severe uncontrolled pain   Complete by: As directed ?  ? Call MD for:  temperature >100.4   Complete by: As directed ?  ? Diet - low sodium heart healthy   Complete by: As  directed ?  ? Discharge instructions   Complete by: As directed ?  ? Recommendations at discharge:  ?? You have been started on Eliquis which is a blood thinner for DVT/PE.  Watch out for any excessive bruising, bleeding or black stool. ?? Follow-up with urologist Dr. Junious Silk on 4/25 at 8:15 AM ? ?General discharge instructions: ?Follow with Primary MD Lawerance Cruel, MD in 7 days  ?Please request your PCP  to go over your hospital tests, procedures, radiology results at the follow up. Please get your medicines reviewed and adjusted.  Your PCP may decide to repeat certain labs or tests as needed. ?Do not drive, operate heavy machinery, perform activities at heights, swimming or participation in water activities or provide baby sitting services if your were admitted for syncope or siezures until you have seen by Primary MD or a Neurologist and advised to do so again. ?[place] Controlled Substance Reporting System database was reviewed. Do not drive, operate heavy machinery, perform activities at heights, swim, participate in water activities or provide baby-sitting services while on medications for pain, sleep and mood until your outpatient physician  has reevaluated you and advised to do so again.  You are strongly recommended to comply with the dose, frequency and duration of prescribed medications. ?Activity: As tolerated with Full fall precautions use wal

## 2021-09-05 NOTE — Plan of Care (Signed)

## 2021-09-05 NOTE — TOC Transition Note (Signed)
Transition of Care (TOC) - CM/SW Discharge Note ? ? ?Patient Details  ?Name: [name] [name]. ?MRN: [id number] ?Date of Birth: [birth year] ? ?Transition of Care [facility]) CM/SW Contact:  ?Joanne Chars, LCSW ?Phone Number: ?09/05/2021, 11:40 AM ? ? ?Clinical Narrative:   Pt discharging to [facility], room 38.  RN call report to [phone].   ? ? ? ?Final next level of care: [facility] ?Barriers to Discharge: Barriers Resolved ? ? ?Patient Goals and CMS Choice ?  ?  ?  ? ?Discharge Placement ?  ?           ?Patient chooses bed at:  [facility]) ?Patient to be transferred to facility by: [facility] ?Name of family member notified: wife Clyde Canterbury ?Patient and family notified of of transfer: 09/05/21 ? ?Discharge Plan and Services ?  ?  ?           ?  ?  ?  ?  ?  ?  ?  ?  ?  ?  ? ?Social Determinants of Health (SDOH) Interventions ?  ? ? ?Readmission Risk Interventions ?   ? View : No data to display.  ?  ?  ?  ? ? ? ? ? ?

## 2021-09-05 NOTE — TOC Progression Note (Signed)
Transition of Care (TOC) - Progression Note  ? ? ?Patient Details  ?Name: [name] [name]. ?MRN: [id number] ?Date of Birth: [birth year] ? ?Transition of Care (TOC) CM/SW Contact  ?Lorri Frederick, LCSW ?Phone Number: ?09/05/2021, 8:25 AM ? ?Clinical Narrative:  Berkley Harvey approved in [place]: [id number], 3 days: 4/18-4/20.  ? ? ? ?Expected Discharge Plan: Skilled Nursing Facility ?Barriers to Discharge: Continued Medical Work up, SNF Pending bed offer, Insurance Authorization ? ?Expected Discharge Plan and Services ?Expected Discharge Plan: Skilled Nursing Facility ?  ?  ?  ?Living arrangements for the past 2 months: Single Family Home ?                ?  ?  ?  ?  ?  ?  ?  ?  ?  ?  ? ? ?Social Determinants of Health (SDOH) Interventions ?  ? ?Readmission Risk Interventions ?   ? View : No data to display.  ?  ?  ?  ? ? ?

## 2021-09-06 ENCOUNTER — Telehealth: Payer: Self-pay

## 2021-09-06 NOTE — Telephone Encounter (Signed)
Pt's wife wanted an order faxed to [facility] saying Dr. Magnus Ivan wants them to continue icing her husbands knee. She wants it faxed to them @ [phone] ?

## 2021-09-06 NOTE — Telephone Encounter (Signed)
Note completed. Can you please fax it? ?

## 2021-09-12 DIAGNOSIS — R338 Other retention of urine: Secondary | ICD-10-CM | POA: Diagnosis not present

## 2021-09-12 DIAGNOSIS — N200 Calculus of kidney: Secondary | ICD-10-CM | POA: Diagnosis not present

## 2021-09-12 DIAGNOSIS — N182 Chronic kidney disease, stage 2 (mild): Secondary | ICD-10-CM | POA: Diagnosis not present

## 2021-09-12 DIAGNOSIS — I10 Essential (primary) hypertension: Secondary | ICD-10-CM | POA: Diagnosis not present

## 2021-09-12 DIAGNOSIS — E785 Hyperlipidemia, unspecified: Secondary | ICD-10-CM | POA: Diagnosis not present

## 2021-09-13 ENCOUNTER — Ambulatory Visit (INDEPENDENT_AMBULATORY_CARE_PROVIDER_SITE_OTHER): Payer: Medicare Other | Admitting: Orthopaedic Surgery

## 2021-09-13 ENCOUNTER — Encounter: Payer: Self-pay | Admitting: Orthopaedic Surgery

## 2021-09-13 ENCOUNTER — Telehealth: Payer: Self-pay | Admitting: *Deleted

## 2021-09-13 ENCOUNTER — Telehealth: Payer: Self-pay | Admitting: Orthopaedic Surgery

## 2021-09-13 ENCOUNTER — Ambulatory Visit (INDEPENDENT_AMBULATORY_CARE_PROVIDER_SITE_OTHER): Payer: Medicare Other

## 2021-09-13 DIAGNOSIS — Z96651 Presence of right artificial knee joint: Secondary | ICD-10-CM

## 2021-09-13 NOTE — Telephone Encounter (Signed)
In office 14 day visit completed. This is slightly delayed due to hospital readmission and discharge to SNF for STR after readmit. Will continue to follow for needs. ?

## 2021-09-13 NOTE — Telephone Encounter (Signed)
Pt wife called and was trying to use the "ice man" for her husband but is missing the part that goes into the wall. ? ?//cb [phone] ?

## 2021-09-13 NOTE — Progress Notes (Signed)
The patient is being seen 3 weeks status post a right total knee arthroplasty.  His postoperative course was complicated by a DVT and PE.  He also has some chronic urologic issues and now has an indwelling Foley catheter.  He is followed by [facility] and Dr. Mena Goes.  He is on blood thinning medication as well.  He has been staying in [place] working on rehab.  Also postoperatively he developed foot drop on that right side.  He is pleased with what therapy is doing for him he just needs to get stronger he states. ? ?His extension is almost full and his flexion is to just at 90 degrees of his right knee.  I think there was some setbacks from just the hospitalization ordeal and what he was dealing with medically.  He is making progress.  He obviously still has foot drop as well. ? ?2 views of the right knee show well-seated total knee arthroplasty with no complicating features. ? ?He will work on aggressive physical therapy on his right knee.  The foot drop will hopefully resolve over the next year.  I would like to see him back in 4 weeks to see how is doing overall but no x-rays are needed.  Eventually we will repeat a Doppler ultrasound in about 3 months. ?

## 2021-09-13 NOTE — Telephone Encounter (Signed)
I called patient's wife and left her a message. I had an extra cord for the ice machine. I left at front desk.  ?

## 2021-09-13 NOTE — Telephone Encounter (Signed)
Thank you :)

## 2021-09-13 NOTE — Telephone Encounter (Signed)
Pt called back and she wants to know if she can buy them locally.  ?

## 2021-09-13 NOTE — Telephone Encounter (Signed)
Sent message to Ryan/Donjoy to assist with patients questions  ?

## 2021-09-15 ENCOUNTER — Encounter: Payer: Self-pay | Admitting: Orthopaedic Surgery

## 2021-09-15 NOTE — Telephone Encounter (Signed)
Do you know how to get this for pt? ?

## 2021-09-18 ENCOUNTER — Encounter: Payer: Medicare Other | Admitting: Orthopaedic Surgery

## 2021-09-18 DIAGNOSIS — R197 Diarrhea, unspecified: Secondary | ICD-10-CM | POA: Diagnosis not present

## 2021-09-18 DIAGNOSIS — R300 Dysuria: Secondary | ICD-10-CM | POA: Diagnosis not present

## 2021-09-18 DIAGNOSIS — D508 Other iron deficiency anemias: Secondary | ICD-10-CM | POA: Diagnosis not present

## 2021-09-18 DIAGNOSIS — N182 Chronic kidney disease, stage 2 (mild): Secondary | ICD-10-CM | POA: Diagnosis not present

## 2021-09-18 DIAGNOSIS — R509 Fever, unspecified: Secondary | ICD-10-CM | POA: Diagnosis not present

## 2021-09-18 DIAGNOSIS — R0602 Shortness of breath: Secondary | ICD-10-CM | POA: Diagnosis not present

## 2021-09-18 DIAGNOSIS — R339 Retention of urine, unspecified: Secondary | ICD-10-CM | POA: Diagnosis not present

## 2021-09-18 DIAGNOSIS — R5381 Other malaise: Secondary | ICD-10-CM | POA: Diagnosis not present

## 2021-09-18 DIAGNOSIS — I2699 Other pulmonary embolism without acute cor pulmonale: Secondary | ICD-10-CM | POA: Diagnosis not present

## 2021-09-20 DIAGNOSIS — I82401 Acute embolism and thrombosis of unspecified deep veins of right lower extremity: Secondary | ICD-10-CM | POA: Diagnosis not present

## 2021-09-20 DIAGNOSIS — I2699 Other pulmonary embolism without acute cor pulmonale: Secondary | ICD-10-CM | POA: Diagnosis not present

## 2021-09-21 DIAGNOSIS — Z7982 Long term (current) use of aspirin: Secondary | ICD-10-CM | POA: Diagnosis not present

## 2021-09-21 DIAGNOSIS — N182 Chronic kidney disease, stage 2 (mild): Secondary | ICD-10-CM | POA: Diagnosis not present

## 2021-09-21 DIAGNOSIS — I82401 Acute embolism and thrombosis of unspecified deep veins of right lower extremity: Secondary | ICD-10-CM | POA: Diagnosis not present

## 2021-09-21 DIAGNOSIS — Z7901 Long term (current) use of anticoagulants: Secondary | ICD-10-CM | POA: Diagnosis not present

## 2021-09-21 DIAGNOSIS — Z471 Aftercare following joint replacement surgery: Secondary | ICD-10-CM | POA: Diagnosis not present

## 2021-09-21 DIAGNOSIS — E785 Hyperlipidemia, unspecified: Secondary | ICD-10-CM | POA: Diagnosis not present

## 2021-09-21 DIAGNOSIS — R338 Other retention of urine: Secondary | ICD-10-CM | POA: Diagnosis not present

## 2021-09-21 DIAGNOSIS — I129 Hypertensive chronic kidney disease with stage 1 through stage 4 chronic kidney disease, or unspecified chronic kidney disease: Secondary | ICD-10-CM | POA: Diagnosis not present

## 2021-09-21 DIAGNOSIS — N200 Calculus of kidney: Secondary | ICD-10-CM | POA: Diagnosis not present

## 2021-09-21 DIAGNOSIS — Z96651 Presence of right artificial knee joint: Secondary | ICD-10-CM | POA: Diagnosis not present

## 2021-09-25 DIAGNOSIS — Z7901 Long term (current) use of anticoagulants: Secondary | ICD-10-CM | POA: Diagnosis not present

## 2021-09-25 DIAGNOSIS — Z96651 Presence of right artificial knee joint: Secondary | ICD-10-CM | POA: Diagnosis not present

## 2021-09-25 DIAGNOSIS — N182 Chronic kidney disease, stage 2 (mild): Secondary | ICD-10-CM | POA: Diagnosis not present

## 2021-09-25 DIAGNOSIS — I82401 Acute embolism and thrombosis of unspecified deep veins of right lower extremity: Secondary | ICD-10-CM | POA: Diagnosis not present

## 2021-09-25 DIAGNOSIS — E785 Hyperlipidemia, unspecified: Secondary | ICD-10-CM | POA: Diagnosis not present

## 2021-09-25 DIAGNOSIS — I129 Hypertensive chronic kidney disease with stage 1 through stage 4 chronic kidney disease, or unspecified chronic kidney disease: Secondary | ICD-10-CM | POA: Diagnosis not present

## 2021-09-25 DIAGNOSIS — Z471 Aftercare following joint replacement surgery: Secondary | ICD-10-CM | POA: Diagnosis not present

## 2021-09-25 DIAGNOSIS — R338 Other retention of urine: Secondary | ICD-10-CM | POA: Diagnosis not present

## 2021-09-25 DIAGNOSIS — Z7982 Long term (current) use of aspirin: Secondary | ICD-10-CM | POA: Diagnosis not present

## 2021-09-25 DIAGNOSIS — N200 Calculus of kidney: Secondary | ICD-10-CM | POA: Diagnosis not present

## 2021-09-26 DIAGNOSIS — R338 Other retention of urine: Secondary | ICD-10-CM | POA: Diagnosis not present

## 2021-09-27 ENCOUNTER — Encounter (HOSPITAL_COMMUNITY): Payer: Self-pay | Admitting: Emergency Medicine

## 2021-09-27 ENCOUNTER — Emergency Department (HOSPITAL_COMMUNITY)
Admission: EM | Admit: 2021-09-27 | Discharge: 2021-09-27 | Disposition: A | Discharge status: Home / Self Care | Payer: Medicare Other | Attending: Student | Admitting: Student

## 2021-09-27 ENCOUNTER — Other Ambulatory Visit: Payer: Self-pay

## 2021-09-27 DIAGNOSIS — N1831 Chronic kidney disease, stage 3a: Secondary | ICD-10-CM | POA: Diagnosis not present

## 2021-09-27 DIAGNOSIS — I129 Hypertensive chronic kidney disease with stage 1 through stage 4 chronic kidney disease, or unspecified chronic kidney disease: Secondary | ICD-10-CM | POA: Insufficient documentation

## 2021-09-27 DIAGNOSIS — Z87891 Personal history of nicotine dependence: Secondary | ICD-10-CM | POA: Insufficient documentation

## 2021-09-27 DIAGNOSIS — R339 Retention of urine, unspecified: Secondary | ICD-10-CM | POA: Diagnosis not present

## 2021-09-27 DIAGNOSIS — I82401 Acute embolism and thrombosis of unspecified deep veins of right lower extremity: Secondary | ICD-10-CM | POA: Diagnosis not present

## 2021-09-27 DIAGNOSIS — E785 Hyperlipidemia, unspecified: Secondary | ICD-10-CM | POA: Diagnosis not present

## 2021-09-27 DIAGNOSIS — Z7901 Long term (current) use of anticoagulants: Secondary | ICD-10-CM | POA: Diagnosis not present

## 2021-09-27 DIAGNOSIS — R7989 Other specified abnormal findings of blood chemistry: Secondary | ICD-10-CM | POA: Insufficient documentation

## 2021-09-27 DIAGNOSIS — N182 Chronic kidney disease, stage 2 (mild): Secondary | ICD-10-CM | POA: Diagnosis not present

## 2021-09-27 DIAGNOSIS — R197 Diarrhea, unspecified: Secondary | ICD-10-CM | POA: Diagnosis not present

## 2021-09-27 DIAGNOSIS — D72829 Elevated white blood cell count, unspecified: Secondary | ICD-10-CM | POA: Insufficient documentation

## 2021-09-27 DIAGNOSIS — Z7982 Long term (current) use of aspirin: Secondary | ICD-10-CM | POA: Diagnosis not present

## 2021-09-27 DIAGNOSIS — Z96651 Presence of right artificial knee joint: Secondary | ICD-10-CM | POA: Insufficient documentation

## 2021-09-27 DIAGNOSIS — N200 Calculus of kidney: Secondary | ICD-10-CM | POA: Diagnosis not present

## 2021-09-27 DIAGNOSIS — R338 Other retention of urine: Secondary | ICD-10-CM | POA: Diagnosis not present

## 2021-09-27 DIAGNOSIS — Z471 Aftercare following joint replacement surgery: Secondary | ICD-10-CM | POA: Diagnosis not present

## 2021-09-27 LAB — BASIC METABOLIC PANEL
Anion gap: 11 (ref 5–15)
BUN: 22 mg/dL (ref 8–23)
CO2: 23 mmol/L (ref 22–32)
Calcium: 9.6 mg/dL (ref 8.9–10.3)
Chloride: 99 mmol/L (ref 98–111)
Creatinine, Ser: 1.46 mg/dL — ABNORMAL HIGH (ref 0.61–1.24)
GFR, Estimated: 49 mL/min — ABNORMAL LOW (ref >60)
Glucose, Bld: 124 mg/dL — ABNORMAL HIGH (ref 70–99)
Potassium: 4.1 mmol/L (ref 3.5–5.1)
Sodium: 133 mmol/L — ABNORMAL LOW (ref 135–145)

## 2021-09-27 LAB — CBC WITH DIFFERENTIAL/PLATELET
Abs Immature Granulocytes: 0.14 10*3/uL — ABNORMAL HIGH (ref 0.00–0.07)
Basophils Absolute: 0 10*3/uL (ref 0.0–0.1)
Basophils Relative: 0 %
Eosinophils Absolute: 0 10*3/uL (ref 0.0–0.5)
Eosinophils Relative: 0 %
HCT: 38.3 % — ABNORMAL LOW (ref 39.0–52.0)
Hemoglobin: 12 g/dL — ABNORMAL LOW (ref 13.0–17.0)
Immature Granulocytes: 1 %
Lymphocytes Relative: 10 %
Lymphs Abs: 1.7 10*3/uL (ref 0.7–4.0)
MCH: 28 pg (ref 26.0–34.0)
MCHC: 31.3 g/dL (ref 30.0–36.0)
MCV: 89.3 fL (ref 80.0–100.0)
Monocytes Absolute: 0.7 10*3/uL (ref 0.1–1.0)
Monocytes Relative: 4 %
Neutro Abs: 14.1 10*3/uL — ABNORMAL HIGH (ref 1.7–7.7)
Neutrophils Relative %: 85 %
Platelets: 311 10*3/uL (ref 150–400)
RBC: 4.29 MIL/uL (ref 4.22–5.81)
RDW: 14.5 % (ref 11.5–15.5)
WBC: 16.7 10*3/uL — ABNORMAL HIGH (ref 4.0–10.5)
nRBC: 0 % (ref 0.0–0.2)

## 2021-09-27 LAB — URINALYSIS, ROUTINE W REFLEX MICROSCOPIC
Bilirubin Urine: NEGATIVE
Glucose, UA: NEGATIVE mg/dL
Ketones, ur: NEGATIVE mg/dL
Leukocytes,Ua: NEGATIVE
Nitrite: POSITIVE — AB
Protein, ur: NEGATIVE mg/dL
Specific Gravity, Urine: 1.014 (ref 1.005–1.030)
pH: 7 (ref 5.0–8.0)

## 2021-09-27 NOTE — ED Triage Notes (Signed)
Pt had catheter removed yesterday and had normal urination yesterday afternoon. Last urinated last night. Pain in lower abdomen.  Had had that catheter since April 10th ?

## 2021-09-27 NOTE — ED Provider Triage Note (Signed)
Emergency Medicine Provider Triage Evaluation Note ? ?[name] [name]. , a 81 y.o. male  was evaluated in triage.  Pt complains of urinary retention since yesterday.  Denies fever, chills.  Had Foley catheter removed yesterday by [facility]. ? ?Review of Systems  ?Positive: As above ?Negative: As above ? ?Physical Exam  ?BP (!) 162/92   Pulse 90   Temp 97.9 ?F (36.6 ?C)   Resp 16   SpO2 97%  ?Gen:   Awake, no distress   ?Resp:  Normal effort  ?MSK:   Moves extremities without difficulty  ?Other:   ? ?Medical Decision Making  ?Medically screening exam initiated at 9:30 PM.  Appropriate orders placed.  [name] [name]. was informed that the remainder of the evaluation will be completed by another provider, this initial triage assessment does not replace that evaluation, and the importance of remaining in the ED until their evaluation is complete. ? ? ?  ?Marita Kansas, PA-C ?09/27/21 2130 ? ?

## 2021-09-28 NOTE — ED Provider Notes (Signed)
?[facility] ?Provider Note ? ?CSN: [id number] ?Arrival date & time: 09/27/21 2051 ? ?Chief Complaint(s) ?Urinary Retention ? ?HPI ?[name] [name]. is a 81 y.o. male with PMH BPH, HTN, HLD, DVT PE presents emergency department for evaluation of urinary retention.  Patient states that he has had his Foley in since August 28, 2021 and he recently had the Foley removed yesterday afternoon.  He states that he had a single episode of urination at home but has since been unable to urinate and feels a sense of pressure and suprapubic abdominal pain.  He spoke with his urologist who instructed him to go to the emergency department for likely catheterization.  Patient states that he has been feeling constipated and he has been using laxatives and now is having diarrhea.  Denies chest pain, shortness of breath, headache, fever or other systemic symptoms. ? ?Past Medical History ?Past Medical History:  ?Diagnosis Date  ? BPH (benign prostatic hyperplasia)   ? Chronic kidney disease   ? kidney stones  ? Headache(784.0)   ? migraines  ? History of kidney stones   ? Hyperlipidemia   ? Hypertension   ? ?Patient Active Problem List  ? Diagnosis Date Noted  ? Cholelithiasis 08/30/2021  ? Acute pulmonary embolism ([place]) 08/30/2021  ? Acute pulmonary embolus and right leg DVT status post total right knee replacement 08/29/2021  ? Right leg DVT ([place]) 08/29/2021  ? Chronic kidney disease, stage IIIa (moderate) ([place]) 08/29/2021  ? Leukocytosis 08/29/2021  ? Normocytic anemia 08/29/2021  ? 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 Essential hypertension 08/29/2021  ? Status post total knee replacement, right 08/24/2021  ? Status post right knee replacement 08/22/2021  ? Unilateral primary osteoarthritis, right knee 10/19/2020  ? Facet joint disease of lumbosacral region 08/12/2017  ? Chronic bilateral low back pain 06/24/2017  ? Status post arthroscopy of right knee 06/06/2017  ? Acute pain of  right knee 09/10/2016  ? ?Home Medication(s) ?Prior to Admission medications   ?Medication Sig Start Date End Date Taking? Authorizing Provider  ?acetaminophen (TYLENOL) 325 MG tablet Take 325 mg by mouth daily as needed for moderate pain.    [provider]  ?acetaminophen (TYLENOL) 500 MG tablet Take 500 mg by mouth daily as needed.    [provider]  ?albuterol (PROVENTIL) (2.5 MG/3ML) 0.083% nebulizer solution Take 3 mLs (2.5 mg total) by nebulization every 6 (six) hours as needed for wheezing or shortness of breath. 09/05/21   Terrilee Croak, MD  ?apixaban (ELIQUIS) 5 MG TABS tablet Take 1 tablet (5 mg total) by mouth 2 (two) times daily. 09/05/21   Terrilee Croak, MD  ?bisoprolol (ZEBETA) 10 MG tablet Take 1 tablet (10 mg total) by mouth daily. 09/06/21   Terrilee Croak, MD  ?Ferrous Sulfate (IRON PO) Take 1 tablet by mouth daily as needed (Before blood draw).    [provider]  ?finasteride (PROSCAR) 5 MG tablet Take 5 mg by mouth daily. 05/03/21   [provider]  ?methocarbamol (ROBAXIN) 500 MG tablet Take 1 tablet (500 mg total) by mouth every 6 (six) hours as needed for muscle spasms. 08/25/21   Mcarthur Rossetti, MD  ?polyethylene glycol (MIRALAX / GLYCOLAX) 17 g packet Take 17 g by mouth daily. 09/05/21   Terrilee Croak, MD  ?senna-docusate (SENOKOT-S) 8.6-50 MG tablet Take 2 tablets by mouth 2 (two) times daily. 09/05/21   Terrilee Croak, MD  ?tamsulosin (FLOMAX) 0.4 MG CAPS capsule Take 0.4 mg  by mouth in the morning and at bedtime.    [provider]  ?                                                                                                                                  ?Past Surgical History ?Past Surgical History:  ?Procedure Laterality Date  ? ACHILLES TENDON SURGERY    ? APPENDECTOMY    ? BACK SURGERY    ? COLONOSCOPY    ? HERNIA REPAIR    ? INGUINAL HERNIA REPAIR Right 05/26/2013  ? Procedure: RIGHT INGUINAL HERNIA REPAIR ;  Surgeon: Joyice Faster.  Cornett, MD;  Location: [facility];  Service: General;  Laterality: Right;  ? INSERTION OF MESH Right 05/26/2013  ? Procedure: INSERTION OF MESH;  Surgeon: Joyice Faster. Cornett, MD;  Location: [facility];  Service: General;  Laterality: Right;  ? NASAL SEPTUM SURGERY    ? right hand surgery    ? TONSILLECTOMY    ? TOTAL KNEE ARTHROPLASTY Right 08/22/2021  ? Procedure: RIGHT TOTAL KNEE ARTHROPLASTY;  Surgeon: Mcarthur Rossetti, MD;  Location: [facility];  Service: Orthopedics;  Laterality: Right;  ? ?Family History ?Family History  ?Problem Relation Age of Onset  ? Diabetes Father   ? Hypertension Father   ? Heart disease Father   ? Uterine cancer Mother   ? Hypertension Mother   ? ? ?Social History ?Social History  ? ?Tobacco Use  ? Smoking status: Former  ?  Packs/day: 1.00  ?  Years: 5.00  ?  Pack years: 5.00  ?  Types: Cigarettes  ?  Quit date: 05/21/1964  ?  Years since quitting: 57.3  ? Smokeless tobacco: Never  ?Substance Use Topics  ? Alcohol use: No  ? Drug use: No  ? ?Allergies ?Patient has no known allergies. ? ?Review of Systems ?Review of Systems  ?Gastrointestinal:  Positive for abdominal pain.  ?Genitourinary:  Positive for difficulty urinating.  ? ?Physical Exam ?Vital Signs  ?I have reviewed the triage vital signs ?BP 132/86 (BP Location: Left Arm)   Pulse 83   Temp 98 ?F (36.7 ?C) (Oral)   Resp 18   SpO2 99%  ? ?Physical Exam ?Vitals and nursing note reviewed.  ?Constitutional:   ?   General: He is not in acute distress. ?   Appearance: He is well-developed.  ?HENT:  ?   Head: Normocephalic and atraumatic.  ?Eyes:  ?   Conjunctiva/sclera: Conjunctivae normal.  ?Cardiovascular:  ?   Rate and Rhythm: Normal rate and regular rhythm.  ?   Heart sounds: No murmur heard. ?Pulmonary:  ?   Effort: Pulmonary effort is normal. No respiratory distress.  ?   Breath sounds: Normal breath sounds.  ?Abdominal:  ?   General: There is distension.  ?   Palpations: Abdomen is soft.  ?   Tenderness: There is abdominal tenderness.   ?Musculoskeletal:     ?  General: No swelling.  ?   Cervical back: Neck supple.  ?Skin: ?   General: Skin is warm and dry.  ?   Capillary Refill: Capillary refill takes less than 2 seconds.  ?Neurological:  ?   Mental Status: He is alert.  ?Psychiatric:     ?   Mood and Affect: Mood normal.  ? ? ?ED Results and Treatments ?Labs ?(all labs ordered are listed, but only abnormal results are displayed) ?Labs Reviewed  ?CBC WITH DIFFERENTIAL/PLATELET - Abnormal; Notable for the following components:  ?    Result Value  ? WBC 16.7 (*)   ? Hemoglobin 12.0 (*)   ? HCT 38.3 (*)   ? Neutro Abs 14.1 (*)   ? Abs Immature Granulocytes 0.14 (*)   ? All other components within normal limits  ?BASIC METABOLIC PANEL - Abnormal; Notable for the following components:  ? Sodium 133 (*)   ? Glucose, Bld 124 (*)   ? Creatinine, Ser 1.46 (*)   ? GFR, Estimated 49 (*)   ? All other components within normal limits  ?URINALYSIS, ROUTINE W REFLEX MICROSCOPIC - Abnormal; Notable for the following components:  ? APPearance HAZY (*)   ? Hgb urine dipstick MODERATE (*)   ? Nitrite POSITIVE (*)   ? Bacteria, UA MANY (*)   ? All other components within normal limits  ?URINE CULTURE  ?                                                                                                                       ? ?Radiology ?No results found. ? ?Pertinent labs & imaging results that were available during my care of the patient were reviewed by me and considered in my medical decision making (see MDM for details). ? ?Medications Ordered in ED ?Medications - No data to display                                                               ?                                                                    ?Procedures ?Ultrasound ED Abd ? ?Date/Time: 09/28/2021 12:10 AM ?Performed by: Glendora Score, MD ?Authorized by: Glendora Score, MD  ? ?Procedure details:  ?  Indications: decreased urinary output   ?  Scope of abdominal ultrasound: Retained urine. ?   Bladder:  Visualized  ?   ?   ?Bladder findings:  ?  Volume:  800 cc ?Comments:  ?  Significant amount of retained urine ? ?(including critical care time) ? ?Medical Decision Making / ED Course ? ? ?This patient pre

## 2021-09-29 DIAGNOSIS — Z96651 Presence of right artificial knee joint: Secondary | ICD-10-CM | POA: Diagnosis not present

## 2021-09-29 DIAGNOSIS — N200 Calculus of kidney: Secondary | ICD-10-CM | POA: Diagnosis not present

## 2021-09-29 DIAGNOSIS — R338 Other retention of urine: Secondary | ICD-10-CM | POA: Diagnosis not present

## 2021-09-29 DIAGNOSIS — Z7901 Long term (current) use of anticoagulants: Secondary | ICD-10-CM | POA: Diagnosis not present

## 2021-09-29 DIAGNOSIS — I129 Hypertensive chronic kidney disease with stage 1 through stage 4 chronic kidney disease, or unspecified chronic kidney disease: Secondary | ICD-10-CM | POA: Diagnosis not present

## 2021-09-29 DIAGNOSIS — Z471 Aftercare following joint replacement surgery: Secondary | ICD-10-CM | POA: Diagnosis not present

## 2021-09-29 DIAGNOSIS — Z7982 Long term (current) use of aspirin: Secondary | ICD-10-CM | POA: Diagnosis not present

## 2021-09-29 DIAGNOSIS — N182 Chronic kidney disease, stage 2 (mild): Secondary | ICD-10-CM | POA: Diagnosis not present

## 2021-09-29 DIAGNOSIS — I82401 Acute embolism and thrombosis of unspecified deep veins of right lower extremity: Secondary | ICD-10-CM | POA: Diagnosis not present

## 2021-09-29 DIAGNOSIS — E785 Hyperlipidemia, unspecified: Secondary | ICD-10-CM | POA: Diagnosis not present

## 2021-09-30 LAB — URINE CULTURE: Culture: >=100000 — AB

## 2021-10-01 ENCOUNTER — Telehealth (HOSPITAL_BASED_OUTPATIENT_CLINIC_OR_DEPARTMENT_OTHER): Payer: Self-pay | Admitting: *Deleted

## 2021-10-01 NOTE — Telephone Encounter (Signed)
Post ED Visit - Positive Culture Follow-up ? ?Culture report reviewed by antimicrobial stewardship pharmacist: ?[facility] ?[]  , Enzo Bi.D. ?[]  [address], .D., BCPS AQ-ID ?[]  Celedonio Miyamoto, Pharm.D., BCPS ?[]  [address], .D., BCPS ?[]  [place], .D., BCPS, AAHIVP ?[]  Georgina Pillion, Pharm.D., BCPS, AAHIVP ?[]  [address], PharmD, BCPS ?[]  , PharmD, BCPS ?[]  [place], PharmD, BCPS ?[]  [address], PharmD ?[]  , PharmD, BCPS ?[x]  Estella Husk, PharmD ? ? [facility] ?[]  Lysle Pearl, PharmD ?[]  , PharmD ?[]  Phillips Climes, PharmD ?[]  , Rph ?[]  Agapito Games) , PharmD ?[]  Verlan Friends, PharmD ?[]  , PharmD ?[]  Mervyn Gay, PharmD ?[]  , PharmD ?[]  Vinnie Level, PharmD ?[]  Gerri Spore, PharmD ?[]  , PharmD ?[]  Len Childs, PharmD ? ? ?Positive urine culture ?Called patient for symptom check. Patient is currently not having any fever, distention, abdominal pain, or pain with urination. Pt stated he has follow up appt with urologist on Wednesday. No further patient follow-up is required at this time. ? ?Greer Pickerel ?10/01/2021, 10:55 AM ?  ?

## 2021-10-01 NOTE — Progress Notes (Signed)
ED Antimicrobial Stewardship Positive Culture Follow Up  ? ?[name] [name]. is an 81 y.o. male who presented to [facility] on 09/27/2021 with a chief complaint of  ?Chief Complaint  ?Patient presents with  ? Urinary Retention  ? ? ?Recent Results (from the past 720 hour(s))  ?Urine Culture     Status: Abnormal  ? Collection Time: 09/27/21 11:07 PM  ? Specimen: Urine, Catheterized  ?Result Value Ref Range Status  ? Specimen Description URINE, CATHETERIZED  Final  ? Special Requests   Final  ?  NONE ?Performed at [facility], [address] [place], [place] [address] ?  ? Culture >=100,000 COLONIES/mL STAPHYLOCOCCUS EPIDERMIDIS (A)  Final  ? Report Status 09/30/2021 FINAL  Final  ? Organism ID, Bacteria STAPHYLOCOCCUS EPIDERMIDIS (A)  Final  ?    Susceptibility  ? Staphylococcus epidermidis - MIC*  ?  CIPROFLOXACIN >=8 RESISTANT Resistant   ?  GENTAMICIN 8 INTERMEDIATE Intermediate   ?  NITROFURANTOIN <=16 SENSITIVE Sensitive   ?  OXACILLIN >=4 RESISTANT Resistant   ?  TETRACYCLINE >=16 RESISTANT Resistant   ?  VANCOMYCIN 2 SENSITIVE Sensitive   ?  TRIMETH/SULFA <=10 SENSITIVE Sensitive   ?  CLINDAMYCIN >=8 RESISTANT Resistant   ?  RIFAMPIN <=0.5 SENSITIVE Sensitive   ?  Inducible Clindamycin NEGATIVE Sensitive   ?  * >=100,000 COLONIES/mL STAPHYLOCOCCUS EPIDERMIDIS  ? ? ?Patient placement to call patient for symptom check for UTI. If asymptomatic, then no antibiotics indicated. If patient has symptoms of UTI, will prescribe bactrim DS 1 tablet twice daily x 7days.  ? ? ?ED Provider: Lurena Nida, PA-C  ? ? ?Vinnie Level, PharmD., BCCCP ?Clinical Pharmacist ?Please refer to [facility] for unit-specific pharmacist  ? ? ?

## 2021-10-03 DIAGNOSIS — Z7901 Long term (current) use of anticoagulants: Secondary | ICD-10-CM | POA: Diagnosis not present

## 2021-10-03 DIAGNOSIS — Z96651 Presence of right artificial knee joint: Secondary | ICD-10-CM | POA: Diagnosis not present

## 2021-10-03 DIAGNOSIS — Z471 Aftercare following joint replacement surgery: Secondary | ICD-10-CM | POA: Diagnosis not present

## 2021-10-03 DIAGNOSIS — I82401 Acute embolism and thrombosis of unspecified deep veins of right lower extremity: Secondary | ICD-10-CM | POA: Diagnosis not present

## 2021-10-03 DIAGNOSIS — N182 Chronic kidney disease, stage 2 (mild): Secondary | ICD-10-CM | POA: Diagnosis not present

## 2021-10-03 DIAGNOSIS — R338 Other retention of urine: Secondary | ICD-10-CM | POA: Diagnosis not present

## 2021-10-03 DIAGNOSIS — Z7982 Long term (current) use of aspirin: Secondary | ICD-10-CM | POA: Diagnosis not present

## 2021-10-03 DIAGNOSIS — I129 Hypertensive chronic kidney disease with stage 1 through stage 4 chronic kidney disease, or unspecified chronic kidney disease: Secondary | ICD-10-CM | POA: Diagnosis not present

## 2021-10-03 DIAGNOSIS — E785 Hyperlipidemia, unspecified: Secondary | ICD-10-CM | POA: Diagnosis not present

## 2021-10-03 DIAGNOSIS — N200 Calculus of kidney: Secondary | ICD-10-CM | POA: Diagnosis not present

## 2021-10-04 DIAGNOSIS — I129 Hypertensive chronic kidney disease with stage 1 through stage 4 chronic kidney disease, or unspecified chronic kidney disease: Secondary | ICD-10-CM | POA: Diagnosis not present

## 2021-10-04 DIAGNOSIS — R338 Other retention of urine: Secondary | ICD-10-CM | POA: Diagnosis not present

## 2021-10-04 DIAGNOSIS — N200 Calculus of kidney: Secondary | ICD-10-CM | POA: Diagnosis not present

## 2021-10-04 DIAGNOSIS — Z7901 Long term (current) use of anticoagulants: Secondary | ICD-10-CM | POA: Diagnosis not present

## 2021-10-04 DIAGNOSIS — N182 Chronic kidney disease, stage 2 (mild): Secondary | ICD-10-CM | POA: Diagnosis not present

## 2021-10-04 DIAGNOSIS — I82401 Acute embolism and thrombosis of unspecified deep veins of right lower extremity: Secondary | ICD-10-CM | POA: Diagnosis not present

## 2021-10-04 DIAGNOSIS — E785 Hyperlipidemia, unspecified: Secondary | ICD-10-CM | POA: Diagnosis not present

## 2021-10-04 DIAGNOSIS — Z7982 Long term (current) use of aspirin: Secondary | ICD-10-CM | POA: Diagnosis not present

## 2021-10-04 DIAGNOSIS — Z471 Aftercare following joint replacement surgery: Secondary | ICD-10-CM | POA: Diagnosis not present

## 2021-10-04 DIAGNOSIS — Z96651 Presence of right artificial knee joint: Secondary | ICD-10-CM | POA: Diagnosis not present

## 2021-10-05 DIAGNOSIS — I82401 Acute embolism and thrombosis of unspecified deep veins of right lower extremity: Secondary | ICD-10-CM | POA: Diagnosis not present

## 2021-10-05 DIAGNOSIS — N182 Chronic kidney disease, stage 2 (mild): Secondary | ICD-10-CM | POA: Diagnosis not present

## 2021-10-05 DIAGNOSIS — Z7982 Long term (current) use of aspirin: Secondary | ICD-10-CM | POA: Diagnosis not present

## 2021-10-05 DIAGNOSIS — Z96651 Presence of right artificial knee joint: Secondary | ICD-10-CM | POA: Diagnosis not present

## 2021-10-05 DIAGNOSIS — Z471 Aftercare following joint replacement surgery: Secondary | ICD-10-CM | POA: Diagnosis not present

## 2021-10-05 DIAGNOSIS — N200 Calculus of kidney: Secondary | ICD-10-CM | POA: Diagnosis not present

## 2021-10-05 DIAGNOSIS — R338 Other retention of urine: Secondary | ICD-10-CM | POA: Diagnosis not present

## 2021-10-05 DIAGNOSIS — Z7901 Long term (current) use of anticoagulants: Secondary | ICD-10-CM | POA: Diagnosis not present

## 2021-10-05 DIAGNOSIS — E785 Hyperlipidemia, unspecified: Secondary | ICD-10-CM | POA: Diagnosis not present

## 2021-10-05 DIAGNOSIS — I129 Hypertensive chronic kidney disease with stage 1 through stage 4 chronic kidney disease, or unspecified chronic kidney disease: Secondary | ICD-10-CM | POA: Diagnosis not present

## 2021-10-09 ENCOUNTER — Telehealth: Payer: Self-pay | Admitting: *Deleted

## 2021-10-09 DIAGNOSIS — N182 Chronic kidney disease, stage 2 (mild): Secondary | ICD-10-CM | POA: Diagnosis not present

## 2021-10-09 DIAGNOSIS — R338 Other retention of urine: Secondary | ICD-10-CM | POA: Diagnosis not present

## 2021-10-09 DIAGNOSIS — N200 Calculus of kidney: Secondary | ICD-10-CM | POA: Diagnosis not present

## 2021-10-09 DIAGNOSIS — Z7901 Long term (current) use of anticoagulants: Secondary | ICD-10-CM | POA: Diagnosis not present

## 2021-10-09 DIAGNOSIS — Z7982 Long term (current) use of aspirin: Secondary | ICD-10-CM | POA: Diagnosis not present

## 2021-10-09 DIAGNOSIS — Z471 Aftercare following joint replacement surgery: Secondary | ICD-10-CM | POA: Diagnosis not present

## 2021-10-09 DIAGNOSIS — I129 Hypertensive chronic kidney disease with stage 1 through stage 4 chronic kidney disease, or unspecified chronic kidney disease: Secondary | ICD-10-CM | POA: Diagnosis not present

## 2021-10-09 DIAGNOSIS — I82401 Acute embolism and thrombosis of unspecified deep veins of right lower extremity: Secondary | ICD-10-CM | POA: Diagnosis not present

## 2021-10-09 DIAGNOSIS — Z96651 Presence of right artificial knee joint: Secondary | ICD-10-CM | POA: Diagnosis not present

## 2021-10-09 DIAGNOSIS — E785 Hyperlipidemia, unspecified: Secondary | ICD-10-CM | POA: Diagnosis not present

## 2021-10-09 NOTE — Telephone Encounter (Signed)
Ortho bundle call back to patient after wife leaving a message. She states that he is having some blood in his catheter. Explained he should most likely contact his PCP or Urology to discuss on how to handle this since he has an indwelling foley catheter presently instead of Orthopedics. He verbalized understanding.

## 2021-10-10 DIAGNOSIS — Z96651 Presence of right artificial knee joint: Secondary | ICD-10-CM | POA: Diagnosis not present

## 2021-10-10 DIAGNOSIS — I82401 Acute embolism and thrombosis of unspecified deep veins of right lower extremity: Secondary | ICD-10-CM | POA: Diagnosis not present

## 2021-10-10 DIAGNOSIS — N182 Chronic kidney disease, stage 2 (mild): Secondary | ICD-10-CM | POA: Diagnosis not present

## 2021-10-10 DIAGNOSIS — N200 Calculus of kidney: Secondary | ICD-10-CM | POA: Diagnosis not present

## 2021-10-10 DIAGNOSIS — I129 Hypertensive chronic kidney disease with stage 1 through stage 4 chronic kidney disease, or unspecified chronic kidney disease: Secondary | ICD-10-CM | POA: Diagnosis not present

## 2021-10-10 DIAGNOSIS — E785 Hyperlipidemia, unspecified: Secondary | ICD-10-CM | POA: Diagnosis not present

## 2021-10-10 DIAGNOSIS — R338 Other retention of urine: Secondary | ICD-10-CM | POA: Diagnosis not present

## 2021-10-10 DIAGNOSIS — Z471 Aftercare following joint replacement surgery: Secondary | ICD-10-CM | POA: Diagnosis not present

## 2021-10-10 DIAGNOSIS — Z7982 Long term (current) use of aspirin: Secondary | ICD-10-CM | POA: Diagnosis not present

## 2021-10-10 DIAGNOSIS — Z7901 Long term (current) use of anticoagulants: Secondary | ICD-10-CM | POA: Diagnosis not present

## 2021-10-11 ENCOUNTER — Encounter: Payer: Self-pay | Admitting: Orthopaedic Surgery

## 2021-10-11 ENCOUNTER — Ambulatory Visit (INDEPENDENT_AMBULATORY_CARE_PROVIDER_SITE_OTHER): Payer: Medicare Other | Admitting: Orthopaedic Surgery

## 2021-10-11 ENCOUNTER — Telehealth: Payer: Self-pay | Admitting: *Deleted

## 2021-10-11 DIAGNOSIS — Z96651 Presence of right artificial knee joint: Secondary | ICD-10-CM | POA: Diagnosis not present

## 2021-10-11 DIAGNOSIS — Z471 Aftercare following joint replacement surgery: Secondary | ICD-10-CM | POA: Diagnosis not present

## 2021-10-11 DIAGNOSIS — N200 Calculus of kidney: Secondary | ICD-10-CM | POA: Diagnosis not present

## 2021-10-11 DIAGNOSIS — E785 Hyperlipidemia, unspecified: Secondary | ICD-10-CM | POA: Diagnosis not present

## 2021-10-11 DIAGNOSIS — I129 Hypertensive chronic kidney disease with stage 1 through stage 4 chronic kidney disease, or unspecified chronic kidney disease: Secondary | ICD-10-CM | POA: Diagnosis not present

## 2021-10-11 DIAGNOSIS — Z7901 Long term (current) use of anticoagulants: Secondary | ICD-10-CM | POA: Diagnosis not present

## 2021-10-11 DIAGNOSIS — I82401 Acute embolism and thrombosis of unspecified deep veins of right lower extremity: Secondary | ICD-10-CM | POA: Diagnosis not present

## 2021-10-11 DIAGNOSIS — M21371 Foot drop, right foot: Secondary | ICD-10-CM

## 2021-10-11 DIAGNOSIS — N182 Chronic kidney disease, stage 2 (mild): Secondary | ICD-10-CM | POA: Diagnosis not present

## 2021-10-11 DIAGNOSIS — R338 Other retention of urine: Secondary | ICD-10-CM | POA: Diagnosis not present

## 2021-10-11 DIAGNOSIS — Z7982 Long term (current) use of aspirin: Secondary | ICD-10-CM | POA: Diagnosis not present

## 2021-10-11 NOTE — Progress Notes (Signed)
HPI: 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  He is overall doing well.  Again unfortunately he had complications postop which consisted of a DVT/PE, urological issues significant continued indwelling Foley and right foot drop.  He is wearing a AFO feels this is beneficial.  States he hopes to get the Foley out next week.  He is on Eliquis for the DVT/PE.  Rates his knee pain to be 5 out of 10 pain at worst.  He is ready to start outpatient therapy at this point.  He is back at home he was initially at [facility] for rehab.  Review of systems: See HPI otherwise negative  Physical exam: General well-developed well-nourished male in ambulates with walker. Right knee full extension flexion to approximately 115 degrees.  No instability valgus varus stressing.  Surgical incisions well-healed.  Sensation grossly intact throughout the foot to light touch.  He has full plantarflexion of the right foot unable to dorsiflex the foot.  With the brace on shoe on able to perform slight dorsiflexion right foot.  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  Start outpatient physical therapy at [facility] Maisie Fus will help facilitate this.  This will be for his foot drop and strengthening of the right knee.  Questions encouraged and answered at length.  He will follow-up with [place] in 1 month.

## 2021-10-11 NOTE — Telephone Encounter (Signed)
Ortho bundle in office meeting completed for 6 week check up.

## 2021-10-18 DIAGNOSIS — R338 Other retention of urine: Secondary | ICD-10-CM | POA: Diagnosis not present

## 2021-10-19 ENCOUNTER — Emergency Department (HOSPITAL_COMMUNITY)
Admission: EM | Admit: 2021-10-19 | Discharge: 2021-10-19 | Disposition: A | Discharge status: Home / Self Care | Payer: Medicare Other | Attending: Student | Admitting: Student

## 2021-10-19 ENCOUNTER — Encounter (HOSPITAL_COMMUNITY): Payer: Self-pay | Admitting: Emergency Medicine

## 2021-10-19 ENCOUNTER — Other Ambulatory Visit: Payer: Self-pay

## 2021-10-19 DIAGNOSIS — R339 Retention of urine, unspecified: Secondary | ICD-10-CM | POA: Diagnosis not present

## 2021-10-19 DIAGNOSIS — M1711 Unilateral primary osteoarthritis, right knee: Secondary | ICD-10-CM | POA: Diagnosis not present

## 2021-10-19 LAB — URINALYSIS, ROUTINE W REFLEX MICROSCOPIC
Bacteria, UA: NONE SEEN
Bilirubin Urine: NEGATIVE
Glucose, UA: NEGATIVE mg/dL
Ketones, ur: NEGATIVE mg/dL
Nitrite: NEGATIVE
Protein, ur: 100 mg/dL — AB
RBC / HPF: >50 RBC/hpf — ABNORMAL HIGH (ref 0–5)
Specific Gravity, Urine: 1.01 (ref 1.005–1.030)
WBC, UA: >50 WBC/hpf — ABNORMAL HIGH (ref 0–5)
pH: 6 (ref 5.0–8.0)

## 2021-10-19 NOTE — ED Provider Notes (Signed)
[facility]EM[facility]ENCY DEPT [facility] Emergency Department Provider Note MRN:  [id number]  Arrival date & time: 10/19/21     Chief Complaint   Urinary Retention   History of Present Illness   [name] [name]. is a 81 y.o. year-old male presents to the ED with chief complaint of urinary retention.  States that he was seen about 2 weeks ago for the same.  Had a Foley catheter placed.  He states that he saw urology this morning and had the catheter removed.  States that he was able to urinate in the office, but has not been able to since to go home.  He states that his bladder feels very full.  He has been drinking water today.  He denies any other symptoms..  History provided by patient.   Review of Systems  Pertinent review of systems noted in HPI.    Physical Exam   Vitals:   10/19/21 0103  BP: (!) 143/82  Pulse: 77  Resp: 16  Temp: 97.7 F (36.5 C)  SpO2: 99%    CONSTITUTIONAL:  well-appearing, NAD NEURO:  Alert and oriented x 3, CN 3-12 grossly intact EYES:  eyes equal and reactive ENT/NECK:  Supple, no stridor  CARDIO:  appears well-perfused  PULM:  No respiratory distress,  GI/GU:  suprapubic ttp,  MSK/SPINE:  No gross deformities, no edema, moves all extremities  SKIN:  no rash, atraumatic   *Additional and/or pertinent findings included in MDM below  Diagnostic and Interventional Summary    EKG Interpretation  Date/Time:    Ventricular Rate:    PR Interval:    QRS Duration:   QT Interval:    QTC Calculation:   R Axis:     Text Interpretation:         Labs Reviewed  URINALYSIS, ROUTINE W REFLEX MICROSCOPIC - Abnormal; Notable for the following components:      Result Value   APPearance HAZY (*)    Hgb urine dipstick LARGE (*)    Protein, ur 100 (*)    Leukocytes,Ua MODERATE (*)    RBC / HPF >50 (*)    WBC, UA >50 (*)    All other components within normal limits  URINE CULTURE    No orders to display    Medications - No data to display    Procedures  /  Critical Care Procedures  ED Course and Medical Decision Making  I have reviewed the triage vital signs, the nursing notes, and pertinent available records from the EMR.  Social Determinants Affecting Complexity of Care: Patient has no clinically significant social determinants affecting this chief complaint..   ED Course:   Patient here with urinary retention. Medical Decision Making Amount and/or Complexity of Data Reviewed Labs: ordered.    Details: Urinalysis shows white cells and red cells, but no bacteria, thought secondary to trauma from catheter placement.  Urine culture is pending.     Consultants: No consultations were needed in caring for this patient.   Treatment and Plan: Emergency department workup does not suggest an emergent condition requiring admission or immediate intervention beyond  what has been performed at this time. The patient is safe for discharge and has  been instructed to return immediately for worsening symptoms, change in  symptoms or any other concerns    Final Clinical Impressions(s) / ED Diagnoses     ICD-10-CM   1. Urinary retention  R33.9       ED Discharge Orders     None  Discharge Instructions Discussed with and Provided to Patient:   Discharge Instructions   None      Rayhaan, [place]uster, PA-C 10/19/21 0151    Glendora Score, MD 10/19/21 [phone]

## 2021-10-19 NOTE — ED Triage Notes (Signed)
Patient had catheter removed yesterday and has not been able to urinate at this time.  Patient having a lot of abdominal pain.

## 2021-10-20 LAB — URINE CULTURE: Culture: NO GROWTH

## 2021-10-21 DIAGNOSIS — M1711 Unilateral primary osteoarthritis, right knee: Secondary | ICD-10-CM | POA: Diagnosis not present

## 2021-10-24 DIAGNOSIS — M1711 Unilateral primary osteoarthritis, right knee: Secondary | ICD-10-CM | POA: Diagnosis not present

## 2021-10-26 DIAGNOSIS — M1711 Unilateral primary osteoarthritis, right knee: Secondary | ICD-10-CM | POA: Diagnosis not present

## 2021-10-31 DIAGNOSIS — M1711 Unilateral primary osteoarthritis, right knee: Secondary | ICD-10-CM | POA: Diagnosis not present

## 2021-11-02 DIAGNOSIS — M1711 Unilateral primary osteoarthritis, right knee: Secondary | ICD-10-CM | POA: Diagnosis not present

## 2021-11-07 DIAGNOSIS — M1711 Unilateral primary osteoarthritis, right knee: Secondary | ICD-10-CM | POA: Diagnosis not present

## 2021-11-09 DIAGNOSIS — R338 Other retention of urine: Secondary | ICD-10-CM | POA: Diagnosis not present

## 2021-11-10 DIAGNOSIS — M1711 Unilateral primary osteoarthritis, right knee: Secondary | ICD-10-CM | POA: Diagnosis not present

## 2021-11-13 ENCOUNTER — Encounter: Payer: Self-pay | Admitting: Orthopaedic Surgery

## 2021-11-13 ENCOUNTER — Ambulatory Visit (INDEPENDENT_AMBULATORY_CARE_PROVIDER_SITE_OTHER): Payer: Medicare Other | Admitting: Orthopaedic Surgery

## 2021-11-13 DIAGNOSIS — Z96651 Presence of right artificial knee joint: Secondary | ICD-10-CM

## 2021-11-14 ENCOUNTER — Other Ambulatory Visit: Payer: Self-pay | Admitting: Urology

## 2021-11-14 DIAGNOSIS — M1711 Unilateral primary osteoarthritis, right knee: Secondary | ICD-10-CM | POA: Diagnosis not present

## 2021-11-15 ENCOUNTER — Telehealth: Payer: Self-pay | Admitting: *Deleted

## 2021-11-15 DIAGNOSIS — R310 Gross hematuria: Secondary | ICD-10-CM | POA: Diagnosis not present

## 2021-11-15 DIAGNOSIS — R338 Other retention of urine: Secondary | ICD-10-CM | POA: Diagnosis not present

## 2021-11-15 NOTE — Telephone Encounter (Signed)
90 day Ortho bundle in office meeting completed.

## 2021-11-16 DIAGNOSIS — M1711 Unilateral primary osteoarthritis, right knee: Secondary | ICD-10-CM | POA: Diagnosis not present

## 2021-11-20 DIAGNOSIS — R310 Gross hematuria: Secondary | ICD-10-CM | POA: Diagnosis not present

## 2021-11-20 DIAGNOSIS — R338 Other retention of urine: Secondary | ICD-10-CM | POA: Diagnosis not present

## 2021-11-22 DIAGNOSIS — M1711 Unilateral primary osteoarthritis, right knee: Secondary | ICD-10-CM | POA: Diagnosis not present

## 2021-11-24 DIAGNOSIS — M1711 Unilateral primary osteoarthritis, right knee: Secondary | ICD-10-CM | POA: Diagnosis not present

## 2021-11-27 DIAGNOSIS — R338 Other retention of urine: Secondary | ICD-10-CM | POA: Diagnosis not present

## 2021-11-28 DIAGNOSIS — M1711 Unilateral primary osteoarthritis, right knee: Secondary | ICD-10-CM | POA: Diagnosis not present

## 2021-11-30 DIAGNOSIS — R339 Retention of urine, unspecified: Secondary | ICD-10-CM | POA: Diagnosis not present

## 2021-11-30 DIAGNOSIS — M1711 Unilateral primary osteoarthritis, right knee: Secondary | ICD-10-CM | POA: Diagnosis not present

## 2021-12-05 DIAGNOSIS — M1711 Unilateral primary osteoarthritis, right knee: Secondary | ICD-10-CM | POA: Diagnosis not present

## 2021-12-07 DIAGNOSIS — M1711 Unilateral primary osteoarthritis, right knee: Secondary | ICD-10-CM | POA: Diagnosis not present

## 2021-12-12 DIAGNOSIS — M1711 Unilateral primary osteoarthritis, right knee: Secondary | ICD-10-CM | POA: Diagnosis not present

## 2021-12-14 DIAGNOSIS — M1711 Unilateral primary osteoarthritis, right knee: Secondary | ICD-10-CM | POA: Diagnosis not present

## 2021-12-26 ENCOUNTER — Other Ambulatory Visit: Payer: Self-pay

## 2021-12-26 ENCOUNTER — Encounter (HOSPITAL_BASED_OUTPATIENT_CLINIC_OR_DEPARTMENT_OTHER): Payer: Self-pay | Admitting: Urology

## 2021-12-26 NOTE — Progress Notes (Addendum)
Spoke w/ via phone for pre-op interview---pt Lab needs dos----  I-stat             Lab results------EKG in epic 08/29/2021 COVID test -----patient states asymptomatic no test needed Arrive at -------0730 NPO after MN NO Solid Food.  Clear liquids from MN until---0630 Med rec completed Medications to take morning of surgery -----Flomax, Proscar and tylenol if needed Diabetic medication -----n/a Patient instructed no nail polish to be worn day of surgery Patient instructed to bring photo id and insurance card day of surgery Patient aware to have Driver (ride ) / caregiver  wife [name] [name]  for 24 hours after surgery  Patient Special Instructions -----Hold Eliquis 2 days before surgery  Pre-Op special Istructions -----n/a Patient verbalized understanding of instructions that were given at this phone interview. Patient denies shortness of breath, chest pain, fever, cough at this phone interview.

## 2022-01-01 NOTE — Anesthesia Preprocedure Evaluation (Signed)
Anesthesia Evaluation  Patient identified by MRN, date of birth, ID band Patient awake    Reviewed: Allergy & Precautions, NPO status , Patient's Chart, lab work & pertinent test results  Airway Mallampati: II  TM Distance: >3 FB Neck ROM: Full    Dental no notable dental hx. (+) Dental Advisory Given, Partial Upper,    Pulmonary former smoker, PE   Pulmonary exam normal breath sounds clear to auscultation       Cardiovascular hypertension, + DVT (on eliquis)  Normal cardiovascular exam Rhythm:Regular Rate:Normal     Neuro/Psych  Headaches,    GI/Hepatic negative GI ROS, Neg liver ROS,   Endo/Other  negative endocrine ROS  Renal/GU Renal disease     Musculoskeletal  (+) Arthritis ,   Abdominal   Peds  Hematology Lab Results      Component                Value               Date                               HGB                      13.3                01/02/2022                HCT                      39.0                01/02/2022                Anesthesia Other Findings   Reproductive/Obstetrics                           Anesthesia Physical Anesthesia Plan  ASA: 3  Anesthesia Plan: General   Post-op Pain Management:    Induction: Intravenous  PONV Risk Score and Plan:   Airway Management Planned: LMA  Additional Equipment: None  Intra-op Plan:   Post-operative Plan:   Informed Consent: I have reviewed the patients History and Physical, chart, labs and discussed the procedure including the risks, benefits and alternatives for the proposed anesthesia with the patient or authorized representative who has indicated his/her understanding and acceptance.     Dental advisory given  Plan Discussed with:   Anesthesia Plan Comments:         Anesthesia Quick Evaluation

## 2022-01-02 ENCOUNTER — Ambulatory Visit (HOSPITAL_BASED_OUTPATIENT_CLINIC_OR_DEPARTMENT_OTHER): Payer: Medicare Other | Admitting: Anesthesiology

## 2022-01-02 ENCOUNTER — Ambulatory Visit (HOSPITAL_BASED_OUTPATIENT_CLINIC_OR_DEPARTMENT_OTHER)
Admission: RE | Admit: 2022-01-02 | Discharge: 2022-01-02 | Disposition: A | Discharge status: Home / Self Care | Payer: Medicare Other | Attending: Urology | Admitting: Urology

## 2022-01-02 ENCOUNTER — Encounter (HOSPITAL_BASED_OUTPATIENT_CLINIC_OR_DEPARTMENT_OTHER): Payer: Self-pay | Admitting: Urology

## 2022-01-02 ENCOUNTER — Encounter (HOSPITAL_BASED_OUTPATIENT_CLINIC_OR_DEPARTMENT_OTHER)
Admission: RE | Disposition: A | Discharge status: Home / Self Care | Payer: Self-pay | Source: Home / Self Care | Attending: Urology

## 2022-01-02 DIAGNOSIS — Z87891 Personal history of nicotine dependence: Secondary | ICD-10-CM | POA: Insufficient documentation

## 2022-01-02 DIAGNOSIS — I10 Essential (primary) hypertension: Secondary | ICD-10-CM | POA: Diagnosis not present

## 2022-01-02 DIAGNOSIS — I129 Hypertensive chronic kidney disease with stage 1 through stage 4 chronic kidney disease, or unspecified chronic kidney disease: Secondary | ICD-10-CM | POA: Diagnosis not present

## 2022-01-02 DIAGNOSIS — M1990 Unspecified osteoarthritis, unspecified site: Secondary | ICD-10-CM

## 2022-01-02 DIAGNOSIS — N138 Other obstructive and reflux uropathy: Secondary | ICD-10-CM

## 2022-01-02 DIAGNOSIS — Z86718 Personal history of other venous thrombosis and embolism: Secondary | ICD-10-CM | POA: Insufficient documentation

## 2022-01-02 DIAGNOSIS — N401 Enlarged prostate with lower urinary tract symptoms: Secondary | ICD-10-CM | POA: Diagnosis present

## 2022-01-02 DIAGNOSIS — R338 Other retention of urine: Secondary | ICD-10-CM

## 2022-01-02 DIAGNOSIS — Z87442 Personal history of urinary calculi: Secondary | ICD-10-CM | POA: Diagnosis not present

## 2022-01-02 DIAGNOSIS — Z7901 Long term (current) use of anticoagulants: Secondary | ICD-10-CM | POA: Diagnosis not present

## 2022-01-02 DIAGNOSIS — Z86711 Personal history of pulmonary embolism: Secondary | ICD-10-CM | POA: Insufficient documentation

## 2022-01-02 DIAGNOSIS — N189 Chronic kidney disease, unspecified: Secondary | ICD-10-CM | POA: Diagnosis not present

## 2022-01-02 HISTORY — DX: Unspecified hearing loss, unspecified ear: H91.90

## 2022-01-02 HISTORY — DX: Presence of spectacles and contact lenses: Z97.3

## 2022-01-02 HISTORY — PX: TRANSURETHRAL RESECTION OF PROSTATE: SHX73

## 2022-01-02 HISTORY — DX: Presence of dental prosthetic device (complete) (partial): Z97.2

## 2022-01-02 HISTORY — PX: THULIUM LASER TURP (TRANSURETHRAL RESECTION OF PROSTATE): SHX6744

## 2022-01-02 LAB — POCT I-STAT, CHEM 8
BUN: 31 mg/dL — ABNORMAL HIGH (ref 8–23)
Calcium, Ion: 1.25 mmol/L (ref 1.15–1.40)
Chloride: 105 mmol/L (ref 98–111)
Creatinine, Ser: 1.2 mg/dL (ref 0.61–1.24)
Glucose, Bld: 105 mg/dL — ABNORMAL HIGH (ref 70–99)
HCT: 39 % (ref 39.0–52.0)
Hemoglobin: 13.3 g/dL (ref 13.0–17.0)
Potassium: 4.3 mmol/L (ref 3.5–5.1)
Sodium: 140 mmol/L (ref 135–145)
TCO2: 23 mmol/L (ref 22–32)

## 2022-01-02 SURGERY — THULIUM LASER TURP (TRANSURETHRAL RESECTION OF PROSTATE)
Anesthesia: General | Site: Prostate

## 2022-01-02 MED ORDER — DEXAMETHASONE SODIUM PHOSPHATE 4 MG/ML IJ SOLN
INTRAMUSCULAR | Status: DC | PRN
  Administered 2022-01-02: 4 mg via INTRAVENOUS

## 2022-01-02 MED ORDER — CEFAZOLIN SODIUM-DEXTROSE 2-4 GM/100ML-% IV SOLN
INTRAVENOUS | Status: AC
  Filled 2022-01-02: qty 100

## 2022-01-02 MED ORDER — SODIUM CHLORIDE 0.9 % IR SOLN
Status: DC | PRN
  Administered 2022-01-02 (×4): 6000 mL

## 2022-01-02 MED ORDER — ONDANSETRON HCL 4 MG/2ML IJ SOLN
INTRAMUSCULAR | Status: DC | PRN
  Administered 2022-01-02: 4 mg via INTRAVENOUS

## 2022-01-02 MED ORDER — PHENYLEPHRINE HCL (PRESSORS) 10 MG/ML IV SOLN
INTRAVENOUS | Status: DC | PRN
  Administered 2022-01-02: 80 ug via INTRAVENOUS
  Administered 2022-01-02: 160 ug via INTRAVENOUS
  Administered 2022-01-02 (×5): 80 ug via INTRAVENOUS

## 2022-01-02 MED ORDER — LIDOCAINE HCL (CARDIAC) PF 100 MG/5ML IV SOSY
PREFILLED_SYRINGE | INTRAVENOUS | Status: DC | PRN
  Administered 2022-01-02: 40 mg via INTRAVENOUS

## 2022-01-02 MED ORDER — PROPOFOL 10 MG/ML IV BOLUS
INTRAVENOUS | Status: DC | PRN
  Administered 2022-01-02: 130 mg via INTRAVENOUS

## 2022-01-02 MED ORDER — FENTANYL CITRATE (PF) 100 MCG/2ML IJ SOLN: 25.0000 ug | INTRAMUSCULAR | Status: DC | PRN

## 2022-01-02 MED ORDER — EPHEDRINE SULFATE (PRESSORS) 50 MG/ML IJ SOLN
INTRAMUSCULAR | Status: DC | PRN
  Administered 2022-01-02 (×6): 10 mg via INTRAVENOUS

## 2022-01-02 MED ORDER — CEPHALEXIN 500 MG PO CAPS: 500.0000 mg | ORAL_CAPSULE | Freq: Every evening | ORAL | 0 refills | Status: AC

## 2022-01-02 MED ORDER — CEFAZOLIN SODIUM-DEXTROSE 2-4 GM/100ML-% IV SOLN
2.0000 g | INTRAVENOUS | Status: AC
  Administered 2022-01-02: 2 g via INTRAVENOUS

## 2022-01-02 MED ORDER — ONDANSETRON HCL 4 MG/2ML IJ SOLN: 4.0000 mg | Freq: Once | INTRAMUSCULAR | Status: DC | PRN

## 2022-01-02 MED ORDER — FENTANYL CITRATE (PF) 100 MCG/2ML IJ SOLN
INTRAMUSCULAR | Status: DC | PRN
Start: 2022-01-02 — End: 2022-01-02
  Administered 2022-01-02: 25 ug via INTRAVENOUS
  Administered 2022-01-02: 50 ug via INTRAVENOUS

## 2022-01-02 MED ORDER — STERILE WATER FOR IRRIGATION IR SOLN
Status: DC | PRN
  Administered 2022-01-02: 500 mL

## 2022-01-02 MED ORDER — FENTANYL CITRATE (PF) 100 MCG/2ML IJ SOLN
INTRAMUSCULAR | Status: AC
  Filled 2022-01-02: qty 2

## 2022-01-02 MED ORDER — SODIUM CHLORIDE 0.9 % IV SOLN: INTRAVENOUS | Status: DC

## 2022-01-02 MED ORDER — ACETAMINOPHEN 10 MG/ML IV SOLN: 1000.0000 mg | Freq: Once | INTRAVENOUS | Status: DC | PRN

## 2022-01-02 MED ORDER — APIXABAN 5 MG PO TABS: 5.0000 mg | ORAL_TABLET | Freq: Two times a day (BID) | ORAL | Status: AC

## 2022-01-02 SURGICAL SUPPLY — 24 items
BAG DRAIN URO-CYSTO SKYTR STRL (DRAIN) ×2 IMPLANT
BAG DRN RND TRDRP ANRFLXCHMBR (UROLOGICAL SUPPLIES) ×1
BAG DRN UROCATH (DRAIN) ×1
BAG URINE DRAIN 2000ML AR STRL (UROLOGICAL SUPPLIES) ×2 IMPLANT
CATH COUDE FOLEY 2W 5CC 20FR (CATHETERS) ×1 IMPLANT
CATH FOLEY 3WAY 30CC 22FR (CATHETERS) ×1 IMPLANT
CLOTH BEACON ORANGE TIMEOUT ST (SAFETY) ×2 IMPLANT
GLOVE BIO SURGEON STRL SZ7.5 (GLOVE) ×2 IMPLANT
GLOVE BIO SURGEON STRL SZ8 (GLOVE) ×1 IMPLANT
GLOVE BIOGEL PI IND STRL 7.0 (GLOVE) IMPLANT
GLOVE BIOGEL PI INDICATOR 7.0 (GLOVE) ×2
GOWN STRL REUS W/TWL LRG LVL3 (GOWN DISPOSABLE) ×3 IMPLANT
HOLDER FOLEY CATH W/STRAP (MISCELLANEOUS) ×1 IMPLANT
IV NS IRRIG 3000ML ARTHROMATIC (IV SOLUTION) ×9 IMPLANT
KIT TURNOVER CYSTO (KITS) ×2 IMPLANT
LASER REVOLIX HI ENERGY 1000 (Laser) ×2 IMPLANT
LASER REVOLIX PROCEDURE (MISCELLANEOUS) ×2 IMPLANT
LOOP CUT BIPOLAR 24F LRG (ELECTROSURGICAL) ×1 IMPLANT
MANIFOLD NEPTUNE II (INSTRUMENTS) ×1 IMPLANT
PACK CYSTO (CUSTOM PROCEDURE TRAY) ×2 IMPLANT
SYR 30ML LL (SYRINGE) ×1 IMPLANT
TUBE CONNECTING 12X1/4 (SUCTIONS) ×1 IMPLANT
TUBING UROLOGY SET (TUBING) ×1 IMPLANT
WATER STERILE IRR 500ML POUR (IV SOLUTION) ×1 IMPLANT

## 2022-01-02 NOTE — Anesthesia Procedure Notes (Signed)
Procedure Name: LMA Insertion Date/Time: 01/02/2022 9:01 AM  Performed by: Earmon Phoenix, CRNAPre-anesthesia Checklist: Patient identified, Emergency Drugs available, Suction available, Patient being monitored and Timeout performed Patient Re-evaluated:Patient Re-evaluated prior to induction Oxygen Delivery Method: Circle system utilized Preoxygenation: Pre-oxygenation with 100% oxygen Induction Type: IV induction Ventilation: Mask ventilation without difficulty LMA: LMA inserted LMA Size: 4.0 Number of attempts: 1 Placement Confirmation: CO2 detector, breath sounds checked- equal and bilateral and positive ETCO2 Tube secured with: Tape Dental Injury: Teeth and Oropharynx as per pre-operative assessment

## 2022-01-02 NOTE — Transfer of Care (Signed)
Immediate Anesthesia Transfer of Care Note  Patient: Jash Wahlen.  Procedure(s) Performed: Morton Peters LASER TURP (TRANSURETHRAL RESECTION OF PROSTATE) (Prostate) TRANSURETHRAL RESECTION OF THE PROSTATE (TURP) (Prostate)  Patient Location: PACU  Anesthesia Type:General  Level of Consciousness: awake, alert  and patient cooperative  Airway & Oxygen Therapy: Patient Spontanous Breathing  Post-op Assessment: Report given to RN and Post -op Vital signs reviewed and stable  Post vital signs: Reviewed and stable  Last Vitals:  Vitals Value Taken Time  BP 121/75 01/02/22 1049  Temp    Pulse 74 01/02/22 1053  Resp 17 01/02/22 1053  SpO2 94 % 01/02/22 1053  Vitals shown include unvalidated device data.  Last Pain:  Vitals:   01/02/22 0750  TempSrc: Oral  PainSc: 7       Patients Stated Pain Goal: 7 (01/02/22 0750)  Complications: No notable events documented.

## 2022-01-02 NOTE — Op Note (Signed)
Preoperative diagnosis: BPH with urinary retention Postoperative diagnosis: Same  Procedure: Thulium laser vaporization of the prostate and transurethral resection of the prostate  Surgeon: Mena Goes  Anesthesia: General  Indication for procedure: [name] [name] is a 81 year old male with a history of BPH.  He had a knee replacement and has been in retention.  Foley was removed and he is learn CIC.  He is voiding some but has to use CIC a few times a week due to lower urinary tract symptoms and incomplete emptying.  Findings: On examination under anesthesia the penis was circumcised without mass or lesion.  Scrotum appeared normal.  Testicles descended bilaterally and palpably normal.  On DRE prostate was about 50 g and smooth without hard area or nodule.  On cystoscopy the urethra was unremarkable, the prostatic urethra was obstructed by tall lateral lobes and median lobe hypertrophy.  Bladder had moderate trabeculation with multiple cellules.  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  There was no stone or foreign body in the bladder.  No mucosal lesions.  I have to switch over to the loop and do minimal resection along the right apex to smooth out and uncover a bleeding vessel.  I would estimate the TURP component was about 5% of the case.  Description of procedure: After consent was obtained patient brought to the operating room.  After adequate anesthesia he was placed lithotomy position and prepped and draped in the usual sterile fashion.  Timeout was performed to confirm the patient and procedure.  Cystoscope was passed per urethra with the laser continuous-flow sheath.  Bladder was inspected.  The ureteral orifice ease were located and marked.  I then started at the patient 7:00 on the right side and made an incision through the prostate and bladder neck and brought that down  to the verumontanum.  Similar incision was made at 5:00 and brought down to the verumontanum.  I started with some of the lateral lobe hypertrophy from anterior to posterior on the right and left to create a little bit more space.  The median lobe was then vaporized.  I then worked my way down the lateral lobes from the bladder neck to the verumontanum.  This created an excellent channel.  There were typical bleeders at the apical lateral region and I got into a larger one I could not control with the laser.  Therefore I swapped it out with the continuous-flow sheath and visual obturator and then switch that with the loop and the handle.  I smoothed out the right side of the prostate and found the bleeding vessel near the right apex.  It was cauterized and there was excellent hemostasis.  All the chips were evacuated.  I then switched back to the laser continuous-flow sheath and finished off the left lateral lobe.  This created an excellent channel the ureteral orifice ease were noted to be normal without injury.  No resection or vaporization was carried out past the verumontanum.  Hemostasis was excellent low-pressure.  Scope was backed out and a 20 [place] coud catheter placed and left to gravity drainage was 70 cc in the balloon.  He was awakened taken recovery room in stable condition.  Complications: None  Blood loss: 50 mL  Specimens: TURP chips  Drains: 20 French coud catheter  Disposition: Patient stable to PACU

## 2022-01-02 NOTE — Discharge Instructions (Signed)

## 2022-01-02 NOTE — H&P (Signed)
H&P  Chief Complaint: BPH, urinary retention.  History of Present Illness: [name] [name] is a 81 year old male with a history of BPH and urinary retention.  He has been on maximal medical therapy with tamsulosin and finasteride.  He developed urinary retention following knee replacement surgery and failed several voiding trials.  He learned CIC.  Office cystoscopy revealed obstructing lateral lobe hypertrophy.  His prostate was 111 g on imaging and then down to 61 g on recent ultrasound June 2023 with 5 alpha reductase use.  His last urine culture grew mixed growth in office and negative in hospital. He was started on cephalexin 3 days ago preoperatively to cover the colonization.  He is well today without congestion or fever.  No dysuria or gross hematuria.  The Foley was removed and the learn CIC.  He is voiding but after a day or 2 he will start to get some urgency, hesitancy frequency and feeling of incomplete emptying and has to catheterize and gets a "good amount" out.  He does this "a few times per week but I could probably do more".  He has had no dysuria or gross hematuria.  He did start his antibiotics a few days ago.  He did stop his Eliquis.   Past Medical History:  Diagnosis Date   BPH (benign prostatic hyperplasia)    Chronic kidney disease    kidney stones   Hard of hearing    wears hearing aides x2. 08/082023   Headache(784.0)    migraines   History of kidney stones    Hyperlipidemia    Hypertension    Wears glasses    12/26/2021   Wears partial dentures    Upper 12/26/2021   Past Surgical History:  Procedure Laterality Date   ACHILLES TENDON SURGERY     APPENDECTOMY     BACK SURGERY     COLONOSCOPY     HERNIA REPAIR     INGUINAL HERNIA REPAIR Right 05/26/2013   Procedure: RIGHT INGUINAL HERNIA REPAIR ;  Surgeon: Maisie Fus A. Cornett, MD;  Location: [facility];  Service: General;  Laterality: Right;   INSERTION OF MESH Right 05/26/2013   Procedure: INSERTION OF MESH;  Surgeon: Clovis Pu.  Cornett, MD;  Location: [facility] OR;  Service: General;  Laterality: Right;   NASAL SEPTUM SURGERY     right hand surgery     TONSILLECTOMY     TOTAL KNEE ARTHROPLASTY Right 08/22/2021   Procedure: RIGHT TOTAL KNEE ARTHROPLASTY;  Surgeon: Kathryne Hitch, MD;  Location: [facility] OR;  Service: Orthopedics;  Laterality: Right;    Home Medications:  Medications Prior to Admission  Medication Sig Dispense Refill Last Dose   bisoprolol-hydrochlorothiazide (ZIAC) 10-6.25 MG tablet Take 1 tablet by mouth daily.      irbesartan (AVAPRO) 150 MG tablet Take 150 mg by mouth daily. Takes in  am will hold day of surgery on 01/02/2022      acetaminophen (TYLENOL) 325 MG tablet Take 325 mg by mouth daily as needed for moderate pain.      acetaminophen (TYLENOL) 500 MG tablet Take 500 mg by mouth daily as needed.      apixaban (ELIQUIS) 5 MG TABS tablet Take 1 tablet (5 mg total) by mouth 2 (two) times daily. 60 tablet     finasteride (PROSCAR) 5 MG tablet Take 5 mg by mouth daily.      tamsulosin (FLOMAX) 0.4 MG CAPS capsule Take 0.4 mg by mouth in the morning and at bedtime.  Allergies: No Known Allergies  Family History  Problem Relation Age of Onset   Diabetes Father    Hypertension Father    Heart disease Father    Uterine cancer Mother    Hypertension Mother    Social History:  reports that he quit smoking about 57 years ago. His smoking use included cigarettes. He has a 5.00 pack-year smoking history. He has never used smokeless tobacco. He reports that he does not drink alcohol and does not use drugs.  ROS: A complete review of systems was performed.  All systems are negative except for pertinent findings as noted. Review of Systems  All other systems reviewed and are negative.    Physical Exam:  Vital signs in last 24 hours:   General:  Alert and oriented, No acute distress HEENT: Normocephalic, atraumatic Cardiovascular: Regular rate and rhythm Lungs: Regular rate and  effort Abdomen: Soft, nontender, nondistended, no abdominal masses Back: No CVA tenderness Extremities: No edema, no calf pain or swelling Neurologic: Grossly intact  Laboratory Data:  No results found for this or any previous visit (from the past 24 hour(s)). No results found for this or any previous visit (from the past 240 hour(s)). Creatinine: No results for input(s): "CREATININE" in the last 168 hours.  Impression/Assessment:  BPH, urinary retention-  Plan:  Again I discussed with the patient the nature, potential benefits, risks and alternatives to thulium laser vaporization of the prostate possible TURP, including side effects of the proposed treatment, the likelihood of the patient achieving the goals of the procedure, and any potential problems that might occur during the procedure or recuperation.  We discussed he may remain with urinary retention/incomplete emptying and require CIC due to bladder and other factors.  Discussed postop care and Foley.  We also discussed canceling the procedure since the Foley is out and he is managing with CIC, but again patient would like to proceed.  All questions answered. Patient elects to proceed.   Jerilee Field 01/02/2022

## 2022-01-02 NOTE — Anesthesia Postprocedure Evaluation (Signed)
Anesthesia Post Note  Patient: [name] [name].  Procedure(s) Performed: Morton Peters LASER TURP (TRANSURETHRAL RESECTION OF PROSTATE) (Prostate) TRANSURETHRAL RESECTION OF THE PROSTATE (TURP) (Prostate)     Patient location during evaluation: PACU Anesthesia Type: General Level of consciousness: awake and alert Pain management: pain level controlled Vital Signs Assessment: post-procedure vital signs reviewed and stable Respiratory status: spontaneous breathing, nonlabored ventilation, respiratory function stable and patient connected to nasal cannula oxygen Cardiovascular status: blood pressure returned to baseline and stable Postop Assessment: no apparent nausea or vomiting Anesthetic complications: no   No notable events documented.  Last Vitals:  Vitals:   01/02/22 1115 01/02/22 1149  BP: 134/69 130/74  Pulse: 71 64  Resp: 15 16  Temp: (!) 36.1 C (!) 36.3 C  SpO2: 97% 99%    Last Pain:  Vitals:   01/02/22 1149  TempSrc:   PainSc: 0-No pain                 Trevor Iha

## 2022-01-02 NOTE — Anesthesia Postprocedure Evaluation (Signed)
Anesthesia Post Note  Patient: Jawuan Rohde Jr.  Procedure(s) Performed: THULIUM LASER TURP (TRANSURETHRAL RESECTION OF PROSTATE) (Prostate) TRANSURETHRAL RESECTION OF THE PROSTATE (TURP) (Prostate)     Patient location during evaluation: PACU Anesthesia Type: General Level of consciousness: awake and alert Pain management: pain level controlled Vital Signs Assessment: post-procedure vital signs reviewed and stable Respiratory status: spontaneous breathing, nonlabored ventilation, respiratory function stable and patient connected to nasal cannula oxygen Cardiovascular status: blood pressure returned to baseline and stable Postop Assessment: no apparent nausea or vomiting Anesthetic complications: no   No notable events documented.  Last Vitals:  Vitals:   01/02/22 1115 01/02/22 1149  BP: 134/69 130/74  Pulse: 71 64  Resp: 15 16  Temp: (!) 36.1 C (!) 36.3 C  SpO2: 97% 99%    Last Pain:  Vitals:   01/02/22 1149  TempSrc:   PainSc: 0-No pain                 Etola Mull A Aaryana Betke     

## 2022-01-03 ENCOUNTER — Encounter (HOSPITAL_BASED_OUTPATIENT_CLINIC_OR_DEPARTMENT_OTHER): Payer: Self-pay | Admitting: Urology

## 2022-01-03 LAB — SURGICAL PATHOLOGY

## 2022-01-04 DIAGNOSIS — E7800 Pure hypercholesterolemia, unspecified: Secondary | ICD-10-CM | POA: Diagnosis not present

## 2022-01-04 DIAGNOSIS — I10 Essential (primary) hypertension: Secondary | ICD-10-CM | POA: Diagnosis not present

## 2022-01-11 ENCOUNTER — Encounter: Payer: Self-pay | Admitting: Physical Medicine and Rehabilitation

## 2022-01-11 DIAGNOSIS — Z0000 Encounter for general adult medical examination without abnormal findings: Secondary | ICD-10-CM | POA: Diagnosis not present

## 2022-01-11 DIAGNOSIS — I10 Essential (primary) hypertension: Secondary | ICD-10-CM | POA: Diagnosis not present

## 2022-01-11 DIAGNOSIS — M21371 Foot drop, right foot: Secondary | ICD-10-CM | POA: Diagnosis not present

## 2022-01-11 DIAGNOSIS — Z86718 Personal history of other venous thrombosis and embolism: Secondary | ICD-10-CM | POA: Diagnosis not present

## 2022-01-16 ENCOUNTER — Ambulatory Visit: Payer: Medicare Other | Admitting: Physical Medicine and Rehabilitation

## 2022-01-16 ENCOUNTER — Encounter: Payer: Self-pay | Admitting: Physical Medicine and Rehabilitation

## 2022-01-16 VITALS — BP 126/72 | HR 74

## 2022-01-16 DIAGNOSIS — M21371 Foot drop, right foot: Secondary | ICD-10-CM | POA: Diagnosis not present

## 2022-01-16 DIAGNOSIS — M48061 Spinal stenosis, lumbar region without neurogenic claudication: Secondary | ICD-10-CM | POA: Diagnosis not present

## 2022-01-16 DIAGNOSIS — Z96651 Presence of right artificial knee joint: Secondary | ICD-10-CM | POA: Diagnosis not present

## 2022-01-16 DIAGNOSIS — M545 Low back pain, unspecified: Secondary | ICD-10-CM | POA: Diagnosis not present

## 2022-01-16 DIAGNOSIS — G8929 Other chronic pain: Secondary | ICD-10-CM | POA: Diagnosis not present

## 2022-01-16 DIAGNOSIS — M47816 Spondylosis without myelopathy or radiculopathy, lumbar region: Secondary | ICD-10-CM | POA: Diagnosis not present

## 2022-01-16 NOTE — Progress Notes (Unsigned)
Pt state lower back pain. Pt state getting out of bed, sitting and laying down makes the pain worse. Pt state he takes over the counter pain meds and uses heat to help ease his pain.  Numeric Pain Rating Scale and Functional Assessment Average Pain 9 Pain Right Now 5 My pain is intermittent, dull, stabbing, and aching Pain is worse with: sitting, standing, some activites, and laying down Pain improves with: rest, heat/ice, and medication   In the last MONTH (on 0-10 scale) has pain interfered with the following?  1. General activity like being  able to carry out your everyday physical activities such as walking, climbing stairs, carrying groceries, or moving a chair?  Rating(5)  2. Relation with others like being able to carry out your usual social activities and roles such as  activities at home, at work and in your community. Rating(6)  3. Enjoyment of life such that you have  been bothered by emotional problems such as feeling anxious, depressed or irritable?  Rating(7)

## 2022-01-16 NOTE — Progress Notes (Addendum)
[name] [name]. - 81 y.o. male MRN [id number]  Date of birth: Jul 09, 1940  Office Visit Note: Visit Date: 01/16/2022 PCP: Lawerance Cruel, MD Referred by: Lawerance Cruel, MD  Subjective: Chief Complaint  Patient presents with   Lower Back - Pain   HPI: [name] Bend. is a 81 y.o. male who comes in today for evaluation of chronic, worsening and severe bilateral lower back pain. Pain ongoing for several years and is exacerbated when moving from sitting to standing position. He describes pain as sore and aching sensation, currently rates as 7 out of 10, states his pain is most severe in the mornings after waking up. Some relief of pain with home exercise regimen, heating pad, rest and use of medications. Patients lumbar MRI imaging from 2019 exhibits multilevel facet arthropathy and moderate central canal stenosis at L2-L3. Patient has history of bilateral L4-L5 and L5-S1 radiofrequency ablation on 06/20/2021, reports greater than 80% relief of pain for approximately 6-8 months with this procedure. Patient underwent right total knee arthroscopy by Dr. Jean Rosenthal on 08/22/2021, he reports right foot drop post procedure. Dr. Ninfa Linden is following patient closely, per his notes right foot drop could take 13 months to resolve. He does have right AFO brace, however patient states brace is uncomfortable and does not wear often. Patient recently completed regimen of physical therapy post op at [facility]. Patient feels lower back pain did worsen after right total knee procedure due to inactivity and being in rehab facility, however he does believe radiofrequency ablation procedure was significantly beneficial in alleviating his pain. Patient recently underwent TURP procedure by Dr. Festus Aloe at [facility]. Patient continues with Eliquis for DVT/PE. Patient denies recent trauma or falls.    Review of Systems  Musculoskeletal:  Positive for back pain.  Neurological:   Positive for weakness. Negative for tingling and sensory change.  All other systems reviewed and are negative.  Otherwise per HPI.  Assessment & Plan: Visit Diagnoses:    ICD-10-CM   1. Chronic bilateral low back pain without sciatica  M54.50    G89.29     2. Spondylosis without myelopathy or radiculopathy, lumbar region  M47.816 MR LUMBAR SPINE WO CONTRAST    3. Facet arthropathy, lumbar  M47.816     4. Spinal stenosis of lumbar region without neurogenic claudication  M48.061     5. Status post right knee replacement  Z96.651     6. Foot drop, right  M21.371        Plan: Findings:  Chronic, worsening and severe bilateral lower back pain. Patient continues to have severe pain despite good conservative therapies such as home exercise regimen, heating pad, rest and use of medications. Patients clinical presentation and exam are consistent with facet mediated pain, however he does have moderate canal stenosis on lumbar MRI imaging from 2019 that could be contributing to symptoms. We do not believe right foot drop is spine related due to onset of symptoms immediately after surgery and will have patient continue follow up with Dr. Ninfa Linden. Patient is aware this could take many weeks to resolve, I did encourage use of right AFO brace. Previous radiofrequency ablation procedure did seem to provide significant relief of pain, however it is unclear how long as pain relief was followed by right total knee arthroscopy procedure. Next step is to obtain lumbar MRI imaging to assess for new findings and worsening spinal canal stenosis. I will have patient follow back up with [place] for  lumbar MRI review and to discuss treatment options. If warranted, we could repeat radiofrequency ablation or look at lumbar epidural steroid injection if we think symptoms are related to spinal canal stenosis. No new red flag symptoms noted upon exam today.     Meds & Orders: No orders of the defined types were placed in this  encounter.   Orders Placed This Encounter  Procedures   MR LUMBAR SPINE WO CONTRAST    Follow-up: Return for follow up for lumbar MRI review.   Procedures: No procedures performed      Clinical History: EXAM: MRI LUMBAR SPINE WITHOUT CONTRAST   TECHNIQUE: Multiplanar, multisequence MR imaging of the lumbar spine was performed. No intravenous contrast was administered.   COMPARISON:  None Available.   FINDINGS: Segmentation:  Standard.   Alignment:  No significant anteroposterior listhesis.   Vertebrae: Diffuse degenerative endplate irregularity and marrow changes. There is mild edema at L1-L2 and L2-L3. No suspicious osseous lesion. 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   Conus medullaris and cauda equina: Conus extends to the L1-L2 level. Conus and cauda equina appear normal.   Paraspinal and other soft tissues: Unremarkable.   Disc levels:   L1-L2: Disc bulge with endplate osteophytic ridging. Prominent dorsal epidural fat. Mild canal stenosis. Minimal foraminal stenosis.   L2-L3: Disc bulge with endplate osteophytic ridging. Facet arthropathy with ligamentum flavum infolding. Mild to moderate canal stenosis. Partial effacement of subarticular recesses. Mild foraminal stenosis.   L3-L4: Disc bulge with endplate osteophytic ridging. Facet arthropathy with ligamentum flavum infolding. Minimal canal stenosis. Mild foraminal stenosis.   L4-L5: Disc bulge with endplate osteophytic ridging. Facet arthropathy with ligamentum flavum infolding. Mild canal stenosis. Partial effacement of subarticular recesses. Mild to moderate right and mild left foraminal stenosis.   L5-S1: Disc bulge with superimposed central protrusion and endplate osteophytic ridging. Facet arthropathy. No canal stenosis. Moderate right and minimal left foraminal stenosis.   IMPRESSION: Multilevel degenerative changes as detailed above without high-grade stenosis.     Electronically  Signed   By: Macy Mis M.D.   On: 01/24/2022 08:25   He reports that he quit smoking about 57 years ago. His smoking use included cigarettes. He has a 5.00 pack-year smoking history. He has never used smokeless tobacco. No results for input(s): "HGBA1C", "LABURIC" in the last 8760 hours.  Objective:  VS:  HT:    WT:   BMI:     BP:126/72  HR:74bpm  TEMP: ( )  RESP:  Physical Exam Vitals and nursing note reviewed.  HENT:     Head: Normocephalic and atraumatic.     Right Ear: External ear normal.     Left Ear: External ear normal.     Nose: Nose normal.     Mouth/Throat:     Mouth: Mucous membranes are moist.  Eyes:     Extraocular Movements: Extraocular movements intact.  Cardiovascular:     Rate and Rhythm: Normal rate.     Pulses: Normal pulses.  Pulmonary:     Effort: Pulmonary effort is normal.  Abdominal:     General: Abdomen is flat. There is no distension.  Musculoskeletal:        General: Tenderness present.     Cervical back: Normal range of motion.     Comments: Pt is slow to rise from seated position to standing. Concordant low back pain with facet loading, lumbar spine extension and rotation. Weakness noted upon dorsiflexion of right foot, strong plantarflexion noted. No pain upon  palpation of greater trochanters. Sensation intact bilaterally. Walks independently, slapping of right foot noted with ambulation.  Skin:    General: Skin is warm and dry.     Capillary Refill: Capillary refill takes less than 2 seconds.  Neurological:     Mental Status: He is alert and oriented to person, place, and time.     Motor: Weakness present.  Psychiatric:        Mood and Affect: Mood normal.        Behavior: Behavior normal.     Ortho Exam  Imaging: No results found.  Past Medical/Family/Surgical/Social History: Medications & Allergies reviewed per EMR, new medications updated. Patient Active Problem List   Diagnosis Date Noted   Cholelithiasis 08/30/2021    Acute pulmonary embolism (HCC) 08/30/2021   Acute pulmonary embolus and right leg DVT status post total right knee replacement 08/29/2021   Right leg DVT (HCC) 08/29/2021   Chronic kidney disease, stage IIIa (moderate) (HCC) 08/29/2021   Leukocytosis 08/29/2021   Normocytic anemia 08/29/2021   BPH with urinary obstruction 08/29/2021   Essential hypertension 08/29/2021   Status post total knee replacement, right 08/24/2021   Status post right knee replacement 08/22/2021   Unilateral primary osteoarthritis, right knee 10/19/2020   Facet joint disease of lumbosacral region 08/12/2017   Chronic bilateral low back pain 06/24/2017   Status post arthroscopy of right knee 06/06/2017   Acute pain of right knee 09/10/2016   Past Medical History:  Diagnosis Date   BPH (benign prostatic hyperplasia)    Chronic kidney disease    kidney stones   Hard of hearing    wears hearing aides x2. 08/082023   [id number](102.7)    migraines   History of kidney stones    Hyperlipidemia    Hypertension    Wears glasses    12/26/2021   Wears partial dentures    Upper 12/26/2021   Family History  Problem Relation Age of Onset   Diabetes Father    Hypertension Father    Heart disease Father    Uterine cancer Mother    Hypertension Mother    Past Surgical History:  Procedure Laterality Date   ACHILLES TENDON SURGERY     APPENDECTOMY     BACK SURGERY     COLONOSCOPY     HERNIA REPAIR     INGUINAL HERNIA REPAIR Right 05/26/2013   Procedure: RIGHT INGUINAL HERNIA REPAIR ;  Surgeon: Maisie Fus A. Cornett, MD;  Location: [facility] OR;  Service: General;  Laterality: Right;   INSERTION OF MESH Right 05/26/2013   Procedure: INSERTION OF MESH;  Surgeon: Clovis Pu. Cornett, MD;  Location: [facility] OR;  Service: General;  Laterality: Right;   NASAL SEPTUM SURGERY     right hand surgery     THULIUM LASER TURP (TRANSURETHRAL RESECTION OF PROSTATE) N/A 01/02/2022   Procedure: THULIUM LASER TURP (TRANSURETHRAL RESECTION OF  PROSTATE);  Surgeon: Jerilee Field, MD;  Location: [facility];  Service: Urology;  Laterality: N/A;   TONSILLECTOMY     TOTAL KNEE ARTHROPLASTY Right 08/22/2021   Procedure: RIGHT TOTAL KNEE ARTHROPLASTY;  Surgeon: Kathryne Hitch, MD;  Location: [facility] OR;  Service: Orthopedics;  Laterality: Right;   TRANSURETHRAL RESECTION OF PROSTATE N/A 01/02/2022   Procedure: TRANSURETHRAL RESECTION OF THE PROSTATE (TURP);  Surgeon: Jerilee Field, MD;  Location: [facility];  Service: Urology;  Laterality: N/A;   Social History   Occupational History   Not on file  Tobacco Use  Smoking status: Former    Packs/day: 1.00    Years: 5.00    Total pack years: 5.00    Types: Cigarettes    Quit date: 05/21/1964    Years since quitting: 57.7   Smokeless tobacco: Never  Vaping Use   Vaping Use: Never used  Substance and Sexual Activity   Alcohol use: No   Drug use: No   Sexual activity: Not on file

## 2022-01-17 DIAGNOSIS — X32XXXD Exposure to sunlight, subsequent encounter: Secondary | ICD-10-CM | POA: Diagnosis not present

## 2022-01-17 DIAGNOSIS — L570 Actinic keratosis: Secondary | ICD-10-CM | POA: Diagnosis not present

## 2022-01-23 ENCOUNTER — Ambulatory Visit
Admission: RE | Admit: 2022-01-23 | Discharge: 2022-01-23 | Disposition: A | Discharge status: Home / Self Care | Payer: Medicare Other | Source: Ambulatory Visit | Attending: Physical Medicine and Rehabilitation | Admitting: Physical Medicine and Rehabilitation

## 2022-01-23 DIAGNOSIS — M545 Low back pain, unspecified: Secondary | ICD-10-CM | POA: Diagnosis not present

## 2022-01-23 DIAGNOSIS — M47816 Spondylosis without myelopathy or radiculopathy, lumbar region: Secondary | ICD-10-CM

## 2022-01-23 DIAGNOSIS — M48061 Spinal stenosis, lumbar region without neurogenic claudication: Secondary | ICD-10-CM | POA: Diagnosis not present

## 2022-01-31 DIAGNOSIS — R3915 Urgency of urination: Secondary | ICD-10-CM | POA: Diagnosis not present

## 2022-02-01 ENCOUNTER — Ambulatory Visit: Payer: Medicare Other | Admitting: Physical Medicine and Rehabilitation

## 2022-02-01 ENCOUNTER — Encounter: Payer: Self-pay | Admitting: Physical Medicine and Rehabilitation

## 2022-02-01 VITALS — BP 146/74 | HR 60

## 2022-02-01 DIAGNOSIS — M47816 Spondylosis without myelopathy or radiculopathy, lumbar region: Secondary | ICD-10-CM

## 2022-02-01 DIAGNOSIS — M48061 Spinal stenosis, lumbar region without neurogenic claudication: Secondary | ICD-10-CM | POA: Diagnosis not present

## 2022-02-01 DIAGNOSIS — M545 Low back pain, unspecified: Secondary | ICD-10-CM | POA: Diagnosis not present

## 2022-02-01 DIAGNOSIS — Z96651 Presence of right artificial knee joint: Secondary | ICD-10-CM | POA: Diagnosis not present

## 2022-02-01 DIAGNOSIS — M21371 Foot drop, right foot: Secondary | ICD-10-CM

## 2022-02-01 DIAGNOSIS — G8929 Other chronic pain: Secondary | ICD-10-CM | POA: Diagnosis not present

## 2022-02-01 NOTE — Progress Notes (Unsigned)
[name] [name]. - 81 y.o. male MRN [id number]  Date of birth: January 31, 1941  Office Visit Note: Visit Date: 02/01/2022 PCP: Lawerance Cruel, MD Referred by: Lawerance Cruel, MD  Subjective: Chief Complaint  Patient presents with   Lower Back - Pain   HPI: [name] [name]. is a 81 y.o. male who comes in today for evaluation of chronic, worsening and severe bilateral lower back pain.  Pain ongoing for several years and is exacerbated by standing and activity.  He reports severe pain when moving from a sitting to standing position.  Describes his pain as a constant sore and aching sensation, currently rates a 7 out of 10.  Patient states his pain is most severe after waking up in the mornings.  Some relief of pain with home exercise regimen, rest and use of medications.  Recent lumbar MRI imaging exhibits multilevel degenerative changes, there is mild to moderate spinal canal stenosis noted at L2-L3, no high-grade spinal canal stenosis noted.  Patient has history of bilateral L4-L5 and L5-S1 radiofrequency ablation on 06/20/2021, reports greater than 80% relief of pain for approximately 6-8 months. Patient underwent right total knee arthroscopy by Dr. Jean Rosenthal on 08/22/2021, he reports right foot drop post procedure. Dr. Ninfa Linden is following patient closely, per his notes right foot drop could take 13 months to resolve. He does have right AFO brace, however patient states brace is uncomfortable and does not wear often. Patient states pain is negatively impacting his daily life. States difficulty performing daily tasks especially yard work, has to take breaks frequently. Patient denies recent trauma or falls.    Review of Systems  Musculoskeletal:  Positive for back pain.  Neurological:  Positive for weakness.  All other systems reviewed and are negative.  Otherwise per HPI.  Assessment & Plan: Visit Diagnoses:    ICD-10-CM   1. Chronic bilateral low back pain without  sciatica  M54.50 Ambulatory referral to Physical Medicine Rehab   G89.29     2. Spondylosis without myelopathy or radiculopathy, lumbar region  M47.816 Ambulatory referral to Physical Medicine Rehab    3. Facet arthropathy, lumbar  M47.816 Ambulatory referral to Physical Medicine Rehab    4. Spinal stenosis of lumbar region without neurogenic claudication  M48.061 Ambulatory referral to Physical Medicine Rehab    5. Status post right knee replacement  Z96.651 Ambulatory referral to Physical Medicine Rehab    6. Foot drop, right  M21.371 Ambulatory referral to Physical Medicine Rehab       Plan: Findings:  Chronic, worsening and severe bilateral axial back pain. No radicular symptoms. Patient continues to have severe pain despite good conservative therapies such as home exercise regimen, rest and use of medications. Patients clinical presentation and exam are consistent with facet medicated pain. Severe pain noted with lumbar extension upon exam today. There is mild to moderate spinal canal stenosis at L2-L3 on new lumbar MRI imaging, however there are no findings that correlate with right foot drop. Next step is to repeat bilateral L4-L5 and L5-S1 radiofrequency ablation under fluoroscopic guidance. Patient encouraged to use AFO brace, he is scheduled to follow up with Dr. Ninfa Linden on 02/14/22. No new red flag symptoms noted upon exam today.     Meds & Orders: No orders of the defined types were placed in this encounter.   Orders Placed This Encounter  Procedures   Ambulatory referral to Physical Medicine Rehab    Follow-up: Return for Bilateal L4-L5 and L5-S1 radiofrequency ablation.  Procedures: No procedures performed      Clinical History: EXAM: MRI LUMBAR SPINE WITHOUT CONTRAST   TECHNIQUE: Multiplanar, multisequence MR imaging of the lumbar spine was performed. No intravenous contrast was administered.   COMPARISON:  None Available.   FINDINGS: Segmentation:   Standard.   Alignment:  No significant anteroposterior listhesis.   Vertebrae: Diffuse degenerative endplate irregularity and marrow changes. There is mild edema at L1-L2 and L2-L3. No suspicious osseous lesion. 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   Conus medullaris and cauda equina: Conus extends to the L1-L2 level. Conus and cauda equina appear normal.   Paraspinal and other soft tissues: Unremarkable.   Disc levels:   L1-L2: Disc bulge with endplate osteophytic ridging. Prominent dorsal epidural fat. Mild canal stenosis. Minimal foraminal stenosis.   L2-L3: Disc bulge with endplate osteophytic ridging. Facet arthropathy with ligamentum flavum infolding. Mild to moderate canal stenosis. Partial effacement of subarticular recesses. Mild foraminal stenosis.   L3-L4: Disc bulge with endplate osteophytic ridging. Facet arthropathy with ligamentum flavum infolding. Minimal canal stenosis. Mild foraminal stenosis.   L4-L5: Disc bulge with endplate osteophytic ridging. Facet arthropathy with ligamentum flavum infolding. Mild canal stenosis. Partial effacement of subarticular recesses. Mild to moderate right and mild left foraminal stenosis.   L5-S1: Disc bulge with superimposed central protrusion and endplate osteophytic ridging. Facet arthropathy. No canal stenosis. Moderate right and minimal left foraminal stenosis.   IMPRESSION: Multilevel degenerative changes as detailed above without high-grade stenosis.     Electronically Signed   By: Guadlupe Spanish M.D.   On: 01/24/2022 08:25   He reports that he quit smoking about 57 years ago. His smoking use included cigarettes. He has a 5.00 pack-year smoking history. He has never used smokeless tobacco. No results for input(s): "HGBA1C", "LABURIC" in the last 8760 hours.  Objective:  VS:  HT:    WT:   BMI:     BP:(!) 146/74  HR:60bpm  TEMP: ( )  RESP:95 % Physical Exam Vitals and nursing note reviewed.  HENT:      Head: Normocephalic and atraumatic.     Right Ear: External ear normal.     Left Ear: External ear normal.     Nose: Nose normal.     Mouth/Throat:     Mouth: Mucous membranes are moist.  Eyes:     Extraocular Movements: Extraocular movements intact.  Cardiovascular:     Rate and Rhythm: Normal rate.     Pulses: Normal pulses.  Pulmonary:     Effort: Pulmonary effort is normal.  Abdominal:     General: Abdomen is flat. There is no distension.  Musculoskeletal:        General: Tenderness present.     Cervical back: Normal range of motion.     Comments: Pt is slow to rise from seated position to standing. Concordant low back pain with facet loading, lumbar spine extension and rotation. Weakness noted upon dorsiflexion of right foot, strong plantarflexion noted. No pain upon palpation of greater trochanters. Sensation intact bilaterally. Walks independently, slapping of right foot noted with ambulation.   Skin:    General: Skin is warm and dry.     Capillary Refill: Capillary refill takes less than 2 seconds.  Neurological:     General: No focal deficit present.     Mental Status: He is alert and oriented to person, place, and time.  Psychiatric:        Mood and Affect: Mood normal.  Behavior: Behavior normal.     Ortho Exam  Imaging: No results found.  Past Medical/Family/Surgical/Social History: Medications & Allergies reviewed per EMR, new medications updated. Patient Active Problem List   Diagnosis Date Noted   Cholelithiasis 08/30/2021   Acute pulmonary embolism (HCC) 08/30/2021   Acute pulmonary embolus and right leg DVT status post total right knee replacement 08/29/2021   Right leg DVT (HCC) 08/29/2021   Chronic kidney disease, stage IIIa (moderate) (HCC) 08/29/2021   Leukocytosis 08/29/2021   Normocytic anemia 08/29/2021   BPH with urinary obstruction 08/29/2021   Essential hypertension 08/29/2021   Status post total knee replacement, right 08/24/2021    Status post right knee replacement 08/22/2021   Unilateral primary osteoarthritis, right knee 10/19/2020   Facet joint disease of lumbosacral region 08/12/2017   Chronic bilateral low back pain 06/24/2017   Status post arthroscopy of right knee 06/06/2017   Acute pain of right knee 09/10/2016   Past Medical History:  Diagnosis Date   BPH (benign prostatic hyperplasia)    Chronic kidney disease    kidney stones   Hard of hearing    wears hearing aides x2. 08/082023   UMPNTIRW(431.5)    migraines   History of kidney stones    Hyperlipidemia    Hypertension    Wears glasses    12/26/2021   Wears partial dentures    Upper 12/26/2021   Family History  Problem Relation Age of Onset   Diabetes Father    Hypertension Father    Heart disease Father    Uterine cancer Mother    Hypertension Mother    Past Surgical History:  Procedure Laterality Date   ACHILLES TENDON SURGERY     APPENDECTOMY     BACK SURGERY     COLONOSCOPY     HERNIA REPAIR     INGUINAL HERNIA REPAIR Right 05/26/2013   Procedure: RIGHT INGUINAL HERNIA REPAIR ;  Surgeon: Maisie Fus A. Cornett, MD;  Location: [facility] OR;  Service: General;  Laterality: Right;   INSERTION OF MESH Right 05/26/2013   Procedure: INSERTION OF MESH;  Surgeon: Clovis Pu. Cornett, MD;  Location: [facility] OR;  Service: General;  Laterality: Right;   NASAL SEPTUM SURGERY     right hand surgery     THULIUM LASER TURP (TRANSURETHRAL RESECTION OF PROSTATE) N/A 01/02/2022   Procedure: THULIUM LASER TURP (TRANSURETHRAL RESECTION OF PROSTATE);  Surgeon: Jerilee Field, MD;  Location[facility];  Service: Urology;  Laterality: N/A;   TONSILLECTOMY     TOTAL KNEE ARTHROPLASTY Right 08/22/2021   Procedure: RIGHT TOTAL KNEE ARTHROPLASTY;  Surgeon: Kathryne Hitch, MD;  Location: [facility] OR;  Service: Orthopedics;  Laterality: Right;   TRANSURETHRAL RESECTION OF PROSTATE N/A 01/02/2022   Procedure: TRANSURETHRAL RESECTION OF THE PROSTATE (TURP);   Surgeon: Jerilee Field, MD;  Location: [facility];  Service: Urology;  Laterality: N/A;   Social History   Occupational History   Not on file  Tobacco Use   Smoking status: Former    Packs/day: 1.00    Years: 5.00    Total pack years: 5.00    Types: Cigarettes    Quit date: 05/21/1964    Years since quitting: 57.7   Smokeless tobacco: Never  Vaping Use   Vaping Use: Never used  Substance and Sexual Activity   Alcohol use: No   Drug use: No   Sexual activity: Not on file

## 2022-02-14 ENCOUNTER — Ambulatory Visit (INDEPENDENT_AMBULATORY_CARE_PROVIDER_SITE_OTHER): Payer: Medicare Other

## 2022-02-14 ENCOUNTER — Encounter: Payer: Self-pay | Admitting: Orthopaedic Surgery

## 2022-02-14 ENCOUNTER — Ambulatory Visit (INDEPENDENT_AMBULATORY_CARE_PROVIDER_SITE_OTHER): Payer: Medicare Other | Admitting: Orthopaedic Surgery

## 2022-02-14 DIAGNOSIS — Z96651 Presence of right artificial knee joint: Secondary | ICD-10-CM | POA: Diagnosis not present

## 2022-02-14 NOTE — Progress Notes (Signed)
[name] [name] is now 6 months status post a right total knee arthroplasty.  His postoperative course was complicated by foot drop.  He says sometimes knee hurts and there are some popping and grinding and he understands as be expected as the knee gets stronger.  Overall though he looks like he is doing well.  He still has some foot drop but on exam there is evidence of the motor function is returning.  He can dorsiflex his foot at this point which she could not before but is still weak and not full.  His right knee feels limply stable with full range of motion.  There is swelling but it is minimal.  2 views of the right knee show well-seated total knee arthroplasty with no complicating features.  He is 81 years old and he is getting return of function of the nerve.  It will still take a while to get full function back.  This does inhibit his mobility in terms of feeling that the foot is slaps against the ground and him being a potential fall risk.  He is going to watch himself closely and work on strengthening.  I would like to see him back in 6 months to see how he is doing overall.  We will need x-rays of the knee if he is having issues with the knee.

## 2022-02-27 ENCOUNTER — Ambulatory Visit (INDEPENDENT_AMBULATORY_CARE_PROVIDER_SITE_OTHER): Payer: Medicare Other | Admitting: Physical Medicine and Rehabilitation

## 2022-02-27 ENCOUNTER — Telehealth: Payer: Self-pay | Admitting: Physical Medicine and Rehabilitation

## 2022-02-27 ENCOUNTER — Ambulatory Visit: Payer: Self-pay

## 2022-02-27 VITALS — BP 139/75 | HR 53

## 2022-02-27 DIAGNOSIS — M47816 Spondylosis without myelopathy or radiculopathy, lumbar region: Secondary | ICD-10-CM | POA: Diagnosis not present

## 2022-02-27 MED ORDER — METHYLPREDNISOLONE ACETATE 80 MG/ML IJ SUSP
40.0000 mg | Freq: Once | INTRAMUSCULAR | Status: AC
  Administered 2022-02-27: 40 mg

## 2022-02-27 NOTE — Progress Notes (Signed)
Numeric Pain Rating Scale and Functional Assessment Average Pain 3   In the last MONTH (on 0-10 scale) has pain interfered with the following?  1. General activity like being  able to carry out your everyday physical activities such as walking, climbing stairs, carrying groceries, or moving a chair?  Rating( Flutuates )   +Driver, -BT- Eliquis, -Dye Allergies.   Pain across the lower back, worse on right than left recently.

## 2022-02-27 NOTE — Patient Instructions (Signed)

## 2022-02-27 NOTE — Telephone Encounter (Signed)
Patient needs to be schedule for second radiofrequency ablation appointment. We can fit in where possible, sooner the better. If you have questions call me.

## 2022-02-28 NOTE — Telephone Encounter (Signed)
Scheduled but holding as reminder to assign tech with Dr Ernestina Patches for 10/17 to perform procedure.

## 2022-03-06 ENCOUNTER — Ambulatory Visit: Payer: Self-pay

## 2022-03-06 ENCOUNTER — Ambulatory Visit: Payer: Medicare Other | Admitting: Physical Medicine and Rehabilitation

## 2022-03-06 VITALS — BP 132/79 | HR 59

## 2022-03-06 DIAGNOSIS — M47816 Spondylosis without myelopathy or radiculopathy, lumbar region: Secondary | ICD-10-CM

## 2022-03-06 MED ORDER — METHYLPREDNISOLONE ACETATE 80 MG/ML IJ SUSP
80.0000 mg | Freq: Once | INTRAMUSCULAR | Status: AC
  Administered 2022-03-06: 80 mg

## 2022-03-06 NOTE — Procedures (Signed)
Lumbar Facet Joint Nerve Denervation  Patient: [name] [name].      Date of Birth: 01-30-1941 MRN: [id number] PCP: Lawerance Cruel, MD      Visit Date: 03/06/2022   Universal Protocol:    Date/Time: 10/17/239:12 AM  Consent Given By: the patient  Position: PRONE  Additional Comments: Vital signs were monitored before and after the procedure. Patient was prepped and draped in the usual sterile fashion. The correct patient, procedure, and site was verified.   Injection Procedure Details:   Procedure diagnoses:  1. Spondylosis without myelopathy or radiculopathy, lumbar region      Meds Administered:  Meds ordered this encounter  Medications   methylPREDNISolone acetate (DEPO-MEDROL) injection 80 mg     Laterality: Left  Location/Site:  L4-L5, L3 and L4 medial branches and L5-S1, L4 medial branch and L5 dorsal ramus  Needle: 18 ga.,  80mm active tip, 169mm RF Cannula  Needle Placement: Along juncture of superior articular process and transverse pocess  Findings:  -Comments:  Procedure Details: For each desired target nerve, the corresponding transverse process (sacral ala for the L5 dorsal rami) was identified and the fluoroscope was positioned to square off the endplates of the corresponding vertebral body to achieve a true AP midline view.  The beam was then obliqued 15 to 20 degrees and caudally tilted 15 to 20 degrees to line up a trajectory along the target nerves. The skin over the target of the junction of superior articulating process and transverse process (sacral ala for the L5 dorsal rami) was infiltrated with 23ml of 1% Lidocaine without Epinephrine.  The 18 gauge 52mm active tip outer cannula was advanced in trajectory view to the target.  This procedure was repeated for each target nerve.  Then, for all levels, the outer cannula placement was fine-tuned and the position was then confirmed with bi-planar imaging.    Test stimulation was done both at  sensory and motor levels to ensure there was no radicular stimulation. The target tissues were then infiltrated with 1 ml of 1% Lidocaine without Epinephrine. Subsequently, a percutaneous neurotomy was carried out for 90 seconds at 80 degrees Celsius.  After the completion of the lesion, 1 ml of injectate was delivered. It was then repeated for each facet joint nerve mentioned above. Appropriate radiographs were obtained to verify the probe placement during the neurotomy.   Additional Comments:  The patient tolerated the procedure well Dressing: 2 x 2 sterile gauze and Band-Aid    Post-procedure details: Patient was observed during the procedure. Post-procedure instructions were reviewed.  Patient left the clinic in stable condition.

## 2022-03-06 NOTE — Procedures (Signed)
Lumbar Facet Joint Nerve Denervation  Patient: [name] [name].      Date of Birth: [birth year] MRN: [id number] PCP: Lawerance Cruel, MD      Visit Date: 02/27/2022   Universal Protocol:    Date/Time: 10/17/238:40 AM  Consent Given By: the patient  Position: PRONE  Additional Comments: Vital signs were monitored before and after the procedure. Patient was prepped and draped in the usual sterile fashion. The correct patient, procedure, and site was verified.   Injection Procedure Details:   Procedure diagnoses:  1. Spondylosis without myelopathy or radiculopathy, lumbar region      Meds Administered:  Meds ordered this encounter  Medications   methylPREDNISolone acetate (DEPO-MEDROL) injection 40 mg     Laterality: Right  Location/Site:  L4-L5, L3 and L4 medial branches and L5-S1, L4 medial branch and L5 dorsal ramus  Needle: 18 ga.,  40mm active tip, 121mm RF Cannula  Needle Placement: Along juncture of superior articular process and transverse pocess  Findings:  -Comments:  Procedure Details: For each desired target nerve, the corresponding transverse process (sacral ala for the L5 dorsal rami) was identified and the fluoroscope was positioned to square off the endplates of the corresponding vertebral body to achieve a true AP midline view.  The beam was then obliqued 15 to 20 degrees and caudally tilted 15 to 20 degrees to line up a trajectory along the target nerves. The skin over the target of the junction of superior articulating process and transverse process (sacral ala for the L5 dorsal rami) was infiltrated with 25ml of 1% Lidocaine without Epinephrine.  The 18 gauge 9mm active tip outer cannula was advanced in trajectory view to the target.  This procedure was repeated for each target nerve.  Then, for all levels, the outer cannula placement was fine-tuned and the position was then confirmed with bi-planar imaging.    Test stimulation was done both at  sensory and motor levels to ensure there was no radicular stimulation. The target tissues were then infiltrated with 1 ml of 1% Lidocaine without Epinephrine. Subsequently, a percutaneous neurotomy was carried out for 90 seconds at 80 degrees Celsius.  After the completion of the lesion, 1 ml of injectate was delivered. It was then repeated for each facet joint nerve mentioned above. Appropriate radiographs were obtained to verify the probe placement during the neurotomy.   Additional Comments:  The patient tolerated the procedure well Dressing: 2 x 2 sterile gauze and Band-Aid    Post-procedure details: Patient was observed during the procedure. Post-procedure instructions were reviewed.  Patient left the clinic in stable condition.

## 2022-03-06 NOTE — Patient Instructions (Signed)

## 2022-03-06 NOTE — Progress Notes (Signed)
Numeric Pain Rating Scale and Functional Assessment Average Pain 3   In the last MONTH (on 0-10 scale) has pain interfered with the following?  1. General activity like being  able to carry out your everyday physical activities such as walking, climbing stairs, carrying groceries, or moving a chair?  Rating(8)   +Driver, -BT- Eliquis, -Dye Allergies.  Bending and getting up after sitting a while is the worse. No pain radiation. Extra strength Tylenol for pain

## 2022-03-06 NOTE — Progress Notes (Signed)
[name] [name]. - 81 y.o. male MRN [id number]  Date of birth: 11-26-1940  Office Visit Note: Visit Date: 02/27/2022 PCP: Lawerance Cruel, MD Referred by: Lawerance Cruel, MD  Subjective: Chief Complaint  Patient presents with   Lower Back - Pain   HPI:  [name] Cassone. is a 81 y.o. male who comes in todayfor planned repeat radiofrequency ablation of the Right L4-5, L5-S1, and L4-L5  Lumbar facet joints. This would be ablation of the corresponding medial branches and/or dorsal rami.  Patient has had double diagnostic blocks with more than 70% relief.  Subsequent ablation gave them more than 6 months of over 60% relief.  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  They have no radicular pain.  Their axial pain is worse with standing and ambulating and on exam today with facet loading.  They have had physical therapy as well as home exercise program.  The imaging noted in the chart below indicated facet pathology. Accordingly they meet all the criteria and qualification for for radiofrequency ablation and we are going to complete this today hopefully for more longer term relief as part of comprehensive management program.   ROS Otherwise per HPI.  Assessment & Plan: Visit Diagnoses:    ICD-10-CM   1. Spondylosis without myelopathy or radiculopathy, lumbar region  M47.816 XR C-ARM NO REPORT    Radiofrequency,Lumbar    methylPREDNISolone acetate (DEPO-MEDROL) injection 40 mg      Plan: No additional findings.   Meds & Orders:  Meds ordered this encounter  Medications   methylPREDNISolone acetate (DEPO-MEDROL) injection 40 mg    Orders Placed This Encounter  Procedures   Radiofrequency,Lumbar   XR C-ARM NO REPORT    Follow-up: Return if symptoms worsen or fail to improve.   Procedures: No procedures performed  Lumbar Facet Joint Nerve Denervation  Patient: [name] [name].      Date of Birth: 1940-07-16 MRN: [id number] PCP: Lawerance Cruel, MD      Visit Date: 02/27/2022   Universal Protocol:    Date/Time: 10/17/238:40 AM  Consent Given By: the patient  Position: PRONE  Additional Comments: Vital signs were monitored before and after the procedure. Patient was prepped and draped in the usual sterile fashion. The correct patient, procedure, and site was verified.   Injection Procedure Details:   Procedure diagnoses:  1. Spondylosis without myelopathy or radiculopathy, lumbar region      Meds Administered:  Meds ordered this encounter  Medications   methylPREDNISolone acetate (DEPO-MEDROL) injection 40 mg     Laterality: Right  Location/Site:  L4-L5, L3 and L4 medial branches and L5-S1, L4 medial branch and L5 dorsal ramus  Needle: 18 ga.,  69mm active tip, 157mm RF Cannula  Needle Placement: Along juncture of superior articular process and transverse pocess  Findings:  -Comments:  Procedure Details: For each desired target nerve, the corresponding transverse process (sacral ala for the L5 dorsal rami) was identified and the fluoroscope was positioned to square off the endplates of the corresponding vertebral body to achieve a true AP midline view.  The beam was then obliqued 15 to 20 degrees and caudally tilted 15 to 20 degrees to line up a trajectory along the target nerves. The skin over the target of the junction of superior articulating process and transverse process (sacral ala for the L5 dorsal rami) was infiltrated with 75ml  of 1% Lidocaine without Epinephrine.  The 18 gauge 35mm active tip outer cannula was advanced in trajectory view to the target.  This procedure was repeated for each target nerve.  Then, for all levels, the outer cannula placement was fine-tuned and the position was then confirmed with bi-planar imaging.    Test stimulation was done both at sensory and motor levels to ensure there was no radicular  stimulation. The target tissues were then infiltrated with 1 ml of 1% Lidocaine without Epinephrine. Subsequently, a percutaneous neurotomy was carried out for 90 seconds at 80 degrees Celsius.  After the completion of the lesion, 1 ml of injectate was delivered. It was then repeated for each facet joint nerve mentioned above. Appropriate radiographs were obtained to verify the probe placement during the neurotomy.   Additional Comments:  The patient tolerated the procedure well Dressing: 2 x 2 sterile gauze and Band-Aid    Post-procedure details: Patient was observed during the procedure. Post-procedure instructions were reviewed.  Patient left the clinic in stable condition.      Clinical History: EXAM: MRI LUMBAR SPINE WITHOUT CONTRAST   TECHNIQUE: Multiplanar, multisequence MR imaging of the lumbar spine was performed. No intravenous contrast was administered.   COMPARISON:  None Available.   FINDINGS: Segmentation:  Standard.   Alignment:  No significant anteroposterior listhesis.   Vertebrae: Diffuse degenerative endplate irregularity and marrow changes. There is mild edema at L1-L2 and L2-L3. No suspicious osseous lesion. 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   Conus medullaris and cauda equina: Conus extends to the L1-L2 level. Conus and cauda equina appear normal.   Paraspinal and other soft tissues: Unremarkable.   Disc levels:   L1-L2: Disc bulge with endplate osteophytic ridging. Prominent dorsal epidural fat. Mild canal stenosis. Minimal foraminal stenosis.   L2-L3: Disc bulge with endplate osteophytic ridging. Facet arthropathy with ligamentum flavum infolding. Mild to moderate canal stenosis. Partial effacement of subarticular recesses. Mild foraminal stenosis.   L3-L4: Disc bulge with endplate osteophytic ridging. Facet arthropathy with ligamentum flavum infolding. Minimal canal stenosis. Mild foraminal stenosis.   L4-L5: Disc bulge with  endplate osteophytic ridging. Facet arthropathy with ligamentum flavum infolding. Mild canal stenosis. Partial effacement of subarticular recesses. Mild to moderate right and mild left foraminal stenosis.   L5-S1: Disc bulge with superimposed central protrusion and endplate osteophytic ridging. Facet arthropathy. No canal stenosis. Moderate right and minimal left foraminal stenosis.   IMPRESSION: Multilevel degenerative changes as detailed above without high-grade stenosis.     Electronically Signed   By: Macy Mis M.D.   On: 01/24/2022 08:25     Objective:  VS:  HT:    WT:   BMI:     BP:139/75  HR:(!) 53bpm  TEMP: ( )  RESP:  Physical Exam Vitals and nursing note reviewed.  Constitutional:      General: He is not in acute distress.    Appearance: Normal appearance. He is not ill-appearing.  HENT:     Head: Normocephalic and atraumatic.     Right Ear: External ear normal.     Left Ear: External ear normal.     Nose: No congestion.  Eyes:     Extraocular Movements: Extraocular movements intact.  Cardiovascular:     Rate and Rhythm: Normal rate.     Pulses: Normal pulses.  Pulmonary:     Effort: Pulmonary effort is normal. No respiratory distress.  Abdominal:     General: There is no distension.  Palpations: Abdomen is soft.  Musculoskeletal:        General: No tenderness or signs of injury.     Cervical back: Neck supple.     Right lower leg: No edema.     Left lower leg: No edema.     Comments: Patient has good distal strength without clonus. Patient somewhat slow to rise from a seated position to full extension.  There is concordant low back pain with facet loading and lumbar spine extension rotation.  There are no definitive trigger points but the patient is somewhat tender across the lower back and PSIS.  There is no pain with hip rotation.   Skin:    Findings: No erythema or rash.  Neurological:     General: No focal deficit present.     Mental  Status: He is alert and oriented to person, place, and time.     Sensory: No sensory deficit.     Motor: No weakness or abnormal muscle tone.     Coordination: Coordination normal.  Psychiatric:        Mood and Affect: Mood normal.        Behavior: Behavior normal.      Imaging: No results found.

## 2022-03-06 NOTE — Progress Notes (Signed)
[name] [name]. - 81 y.o. male MRN [id number]  Date of birth: 17-May-1941  Office Visit Note: Visit Date: 03/06/2022 PCP: Lawerance Cruel, MD Referred by: Lawerance Cruel, MD  Subjective: Chief Complaint  Patient presents with   Lower Back - Pain   HPI:  [name] Irons. is a 81 y.o. male who comes in todayfor planned repeat radiofrequency ablation of the Left L4-5, L5-S1, and L4-L5  Lumbar facet joints. This would be ablation of the corresponding medial branches and/or dorsal rami.  Patient has had double diagnostic blocks with more than 70% relief.  Subsequent ablation gave them more than 6 months of over 60% relief.  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  They have no radicular pain.  Their axial pain is worse with standing and ambulating and on exam today with facet loading.  They have had physical therapy as well as home exercise program.  The imaging noted in the chart below indicated facet pathology. Accordingly they meet all the criteria and qualification for for radiofrequency ablation and we are going to complete this today hopefully for more longer term relief as part of comprehensive management program.   ROS Otherwise per HPI.  Assessment & Plan: Visit Diagnoses:    ICD-10-CM   1. Spondylosis without myelopathy or radiculopathy, lumbar region  M47.816 XR C-ARM NO REPORT    Radiofrequency,Lumbar    methylPREDNISolone acetate (DEPO-MEDROL) injection 80 mg      Plan: No additional findings.   Meds & Orders:  Meds ordered this encounter  Medications   methylPREDNISolone acetate (DEPO-MEDROL) injection 80 mg    Orders Placed This Encounter  Procedures   Radiofrequency,Lumbar   XR C-ARM NO REPORT    Follow-up: No follow-ups on file.   Procedures: No procedures performed  Lumbar Facet Joint Nerve Denervation  Patient: [name] [name].      Date of Birth: [birth year] MRN: [id number] PCP: Lawerance Cruel, MD      Visit Date: 03/06/2022   Universal Protocol:    Date/Time: 10/17/239:12 AM  Consent Given By: the patient  Position: PRONE  Additional Comments: Vital signs were monitored before and after the procedure. Patient was prepped and draped in the usual sterile fashion. The correct patient, procedure, and site was verified.   Injection Procedure Details:   Procedure diagnoses:  1. Spondylosis without myelopathy or radiculopathy, lumbar region      Meds Administered:  Meds ordered this encounter  Medications   methylPREDNISolone acetate (DEPO-MEDROL) injection 80 mg     Laterality: Left  Location/Site:  L4-L5, L3 and L4 medial branches and L5-S1, L4 medial branch and L5 dorsal ramus  Needle: 18 ga.,  20mm active tip, 129mm RF Cannula  Needle Placement: Along juncture of superior articular process and transverse pocess  Findings:  -Comments:  Procedure Details: For each desired target nerve, the corresponding transverse process (sacral ala for the L5 dorsal rami) was identified and the fluoroscope was positioned to square off the endplates of the corresponding vertebral body to achieve a true AP midline view.  The beam was then obliqued 15 to 20 degrees and caudally tilted 15 to 20 degrees to line up a trajectory along the target nerves. The skin over the target of the junction of superior articulating process and transverse process (sacral ala for the L5 dorsal rami) was infiltrated with 62ml of 1% Lidocaine without  Epinephrine.  The 18 gauge 71mm active tip outer cannula was advanced in trajectory view to the target.  This procedure was repeated for each target nerve.  Then, for all levels, the outer cannula placement was fine-tuned and the position was then confirmed with bi-planar imaging.    Test stimulation was done both at sensory and motor levels to ensure there was no radicular  stimulation. The target tissues were then infiltrated with 1 ml of 1% Lidocaine without Epinephrine. Subsequently, a percutaneous neurotomy was carried out for 90 seconds at 80 degrees Celsius.  After the completion of the lesion, 1 ml of injectate was delivered. It was then repeated for each facet joint nerve mentioned above. Appropriate radiographs were obtained to verify the probe placement during the neurotomy.   Additional Comments:  The patient tolerated the procedure well Dressing: 2 x 2 sterile gauze and Band-Aid    Post-procedure details: Patient was observed during the procedure. Post-procedure instructions were reviewed.  Patient left the clinic in stable condition.      Clinical History: EXAM: MRI LUMBAR SPINE WITHOUT CONTRAST   TECHNIQUE: Multiplanar, multisequence MR imaging of the lumbar spine was performed. No intravenous contrast was administered.   COMPARISON:  None Available.   FINDINGS: Segmentation:  Standard.   Alignment:  No significant anteroposterior listhesis.   Vertebrae: Diffuse degenerative endplate irregularity and marrow changes. There is mild edema at L1-L2 and L2-L3. No suspicious osseous lesion. 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   Conus medullaris and cauda equina: Conus extends to the L1-L2 level. Conus and cauda equina appear normal.   Paraspinal and other soft tissues: Unremarkable.   Disc levels:   L1-L2: Disc bulge with endplate osteophytic ridging. Prominent dorsal epidural fat. Mild canal stenosis. Minimal foraminal stenosis.   L2-L3: Disc bulge with endplate osteophytic ridging. Facet arthropathy with ligamentum flavum infolding. Mild to moderate canal stenosis. Partial effacement of subarticular recesses. Mild foraminal stenosis.   L3-L4: Disc bulge with endplate osteophytic ridging. Facet arthropathy with ligamentum flavum infolding. Minimal canal stenosis. Mild foraminal stenosis.   L4-L5: Disc bulge with  endplate osteophytic ridging. Facet arthropathy with ligamentum flavum infolding. Mild canal stenosis. Partial effacement of subarticular recesses. Mild to moderate right and mild left foraminal stenosis.   L5-S1: Disc bulge with superimposed central protrusion and endplate osteophytic ridging. Facet arthropathy. No canal stenosis. Moderate right and minimal left foraminal stenosis.   IMPRESSION: Multilevel degenerative changes as detailed above without high-grade stenosis.     Electronically Signed   By: Macy Mis M.D.   On: 01/24/2022 08:25     Objective:  VS:  HT:    WT:   BMI:     BP:132/79  HR:(!) 59bpm  TEMP: ( )  RESP:  Physical Exam Vitals and nursing note reviewed.  Constitutional:      General: He is not in acute distress.    Appearance: Normal appearance. He is not ill-appearing.  HENT:     Head: Normocephalic and atraumatic.     Right Ear: External ear normal.     Left Ear: External ear normal.     Nose: No congestion.  Eyes:     Extraocular Movements: Extraocular movements intact.  Cardiovascular:     Rate and Rhythm: Normal rate.     Pulses: Normal pulses.  Pulmonary:     Effort: Pulmonary effort is normal. No respiratory distress.  Abdominal:     General: There is no distension.     Palpations: Abdomen is soft.  Musculoskeletal:        General: No tenderness or signs of injury.     Cervical back: Neck supple.     Right lower leg: No edema.     Left lower leg: No edema.     Comments: Patient has good distal strength without clonus.  Skin:    Findings: No erythema or rash.  Neurological:     General: No focal deficit present.     Mental Status: He is alert and oriented to person, place, and time.     Sensory: No sensory deficit.     Motor: No weakness or abnormal muscle tone.     Coordination: Coordination normal.  Psychiatric:        Mood and Affect: Mood normal.        Behavior: Behavior normal.      Imaging: No results found.

## 2022-05-02 DIAGNOSIS — R3912 Poor urinary stream: Secondary | ICD-10-CM | POA: Diagnosis not present

## 2022-07-10 DIAGNOSIS — I10 Essential (primary) hypertension: Secondary | ICD-10-CM | POA: Diagnosis not present

## 2022-07-10 DIAGNOSIS — M179 Osteoarthritis of knee, unspecified: Secondary | ICD-10-CM | POA: Diagnosis not present

## 2022-07-10 DIAGNOSIS — E7800 Pure hypercholesterolemia, unspecified: Secondary | ICD-10-CM | POA: Diagnosis not present

## 2022-08-15 ENCOUNTER — Ambulatory Visit: Payer: Medicare Other | Admitting: Orthopaedic Surgery

## 2022-08-20 ENCOUNTER — Other Ambulatory Visit (INDEPENDENT_AMBULATORY_CARE_PROVIDER_SITE_OTHER): Payer: Medicare Other

## 2022-08-20 ENCOUNTER — Ambulatory Visit: Payer: Medicare Other | Admitting: Orthopaedic Surgery

## 2022-08-20 ENCOUNTER — Encounter: Payer: Self-pay | Admitting: Orthopaedic Surgery

## 2022-08-20 DIAGNOSIS — Z96651 Presence of right artificial knee joint: Secondary | ICD-10-CM

## 2022-08-20 NOTE — Progress Notes (Signed)
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  He says it just feels odd to him because he can feel and hear the clicking in the knee but overall it is not unstable to him.  He is able to walk easily.  He does deal with some chronic back issues.  He did have foot drop after surgery which is completely resolved.  On exam his right knee has normal range of motion.  It feels ligamentously stable.  There is no significant swelling or effusion.  His foot drop is resolved.  2 views of the right knee show well-seated total knee arthroplasty with no complicating features.  At this point follow-up for his knee can be as needed.  He understands that total knees can have mechanical clicking symptoms but he is not unstable.  If he does develop any issues he will let us know.

## 2022-08-23 DIAGNOSIS — R3915 Urgency of urination: Secondary | ICD-10-CM | POA: Diagnosis not present

## 2022-12-05 ENCOUNTER — Telehealth: Payer: Self-pay | Admitting: *Deleted

## 2022-12-05 NOTE — Telephone Encounter (Signed)
1 year Ortho bundle call completed. ?

## 2022-12-18 DIAGNOSIS — G8929 Other chronic pain: Secondary | ICD-10-CM | POA: Diagnosis not present

## 2022-12-19 DIAGNOSIS — X32XXXD Exposure to sunlight, subsequent encounter: Secondary | ICD-10-CM | POA: Diagnosis not present

## 2022-12-19 DIAGNOSIS — L570 Actinic keratosis: Secondary | ICD-10-CM | POA: Diagnosis not present

## 2022-12-27 DIAGNOSIS — M5459 Other low back pain: Secondary | ICD-10-CM | POA: Diagnosis not present

## 2022-12-31 DIAGNOSIS — M5459 Other low back pain: Secondary | ICD-10-CM | POA: Diagnosis not present

## 2023-01-01 ENCOUNTER — Telehealth: Payer: Self-pay | Admitting: Physical Medicine and Rehabilitation

## 2023-01-01 NOTE — Telephone Encounter (Signed)
Received call from [facility] at [facility]. requesting MRI report. I faxed [phone]

## 2023-01-02 DIAGNOSIS — M5459 Other low back pain: Secondary | ICD-10-CM | POA: Diagnosis not present

## 2023-01-07 DIAGNOSIS — M5459 Other low back pain: Secondary | ICD-10-CM | POA: Diagnosis not present

## 2023-01-10 DIAGNOSIS — M5459 Other low back pain: Secondary | ICD-10-CM | POA: Diagnosis not present

## 2023-01-14 DIAGNOSIS — M5459 Other low back pain: Secondary | ICD-10-CM | POA: Diagnosis not present

## 2023-01-17 DIAGNOSIS — M5459 Other low back pain: Secondary | ICD-10-CM | POA: Diagnosis not present

## 2023-01-22 DIAGNOSIS — M5459 Other low back pain: Secondary | ICD-10-CM | POA: Diagnosis not present

## 2023-01-24 DIAGNOSIS — M5459 Other low back pain: Secondary | ICD-10-CM | POA: Diagnosis not present

## 2023-01-30 DIAGNOSIS — E7800 Pure hypercholesterolemia, unspecified: Secondary | ICD-10-CM | POA: Diagnosis not present

## 2023-01-30 DIAGNOSIS — I10 Essential (primary) hypertension: Secondary | ICD-10-CM | POA: Diagnosis not present

## 2023-01-30 DIAGNOSIS — Z0000 Encounter for general adult medical examination without abnormal findings: Secondary | ICD-10-CM | POA: Diagnosis not present

## 2023-03-19 DIAGNOSIS — M47816 Spondylosis without myelopathy or radiculopathy, lumbar region: Secondary | ICD-10-CM | POA: Diagnosis not present

## 2023-03-25 ENCOUNTER — Other Ambulatory Visit: Payer: Self-pay | Admitting: Student

## 2023-03-25 DIAGNOSIS — M47816 Spondylosis without myelopathy or radiculopathy, lumbar region: Secondary | ICD-10-CM

## 2023-04-03 NOTE — Discharge Instructions (Addendum)

## 2023-04-04 ENCOUNTER — Ambulatory Visit
Admission: RE | Admit: 2023-04-04 | Discharge: 2023-04-04 | Disposition: A | Discharge status: Home / Self Care | Payer: Medicare Other | Source: Ambulatory Visit | Attending: Student | Admitting: Student

## 2023-04-04 DIAGNOSIS — M4807 Spinal stenosis, lumbosacral region: Secondary | ICD-10-CM | POA: Diagnosis not present

## 2023-04-04 DIAGNOSIS — M47816 Spondylosis without myelopathy or radiculopathy, lumbar region: Secondary | ICD-10-CM

## 2023-04-04 DIAGNOSIS — I700 Atherosclerosis of aorta: Secondary | ICD-10-CM | POA: Diagnosis not present

## 2023-04-04 DIAGNOSIS — M5136 Other intervertebral disc degeneration, lumbar region with discogenic back pain only: Secondary | ICD-10-CM | POA: Diagnosis not present

## 2023-04-04 DIAGNOSIS — M48061 Spinal stenosis, lumbar region without neurogenic claudication: Secondary | ICD-10-CM | POA: Diagnosis not present

## 2023-04-04 MED ORDER — DIAZEPAM 5 MG PO TABS: 5.0000 mg | ORAL_TABLET | Freq: Once | ORAL | Status: DC

## 2023-04-04 MED ORDER — IOPAMIDOL (ISOVUE-M 200) INJECTION 41%
20.0000 mL | Freq: Once | INTRAMUSCULAR | Status: AC
  Administered 2023-04-04: 20 mL via INTRATHECAL

## 2023-04-04 MED ORDER — MEPERIDINE HCL 50 MG/ML IJ SOLN: 50.0000 mg | Freq: Once | INTRAMUSCULAR | Status: DC | PRN

## 2023-04-04 MED ORDER — ONDANSETRON HCL 4 MG/2ML IJ SOLN: 4.0000 mg | Freq: Once | INTRAMUSCULAR | Status: DC | PRN

## 2023-08-21 DIAGNOSIS — L570 Actinic keratosis: Secondary | ICD-10-CM | POA: Diagnosis not present

## 2023-08-21 DIAGNOSIS — L905 Scar conditions and fibrosis of skin: Secondary | ICD-10-CM | POA: Diagnosis not present

## 2023-08-21 DIAGNOSIS — X32XXXD Exposure to sunlight, subsequent encounter: Secondary | ICD-10-CM | POA: Diagnosis not present

## 2023-08-29 DIAGNOSIS — H903 Sensorineural hearing loss, bilateral: Secondary | ICD-10-CM | POA: Diagnosis not present

## 2023-11-11 ENCOUNTER — Ambulatory Visit: Admitting: Orthopaedic Surgery

## 2023-11-11 ENCOUNTER — Other Ambulatory Visit (INDEPENDENT_AMBULATORY_CARE_PROVIDER_SITE_OTHER)

## 2023-11-11 DIAGNOSIS — Z96651 Presence of right artificial knee joint: Secondary | ICD-10-CM

## 2023-11-11 NOTE — Progress Notes (Signed)
 The patient is well-known to us .  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  He says the knee sometimes feels like is giving way when he gets up from sitting position he gets a sharp pain every once in a while.  He says sometimes there is an ache to the knee and some swelling.  He has no known injury.  Examination his right knee shows full extension and full flexion.  There is no significant swelling today and the knee feels stable to me but certainly what he is describing are symptoms of instability.  2 views of the right knee show well-seated total knee arthroplasty with no complicating features.  His operative note was reviewed and he does have a 12 mm thickness polythene liner.  I went over knee model with him and have recommended physical therapy for his knee with quad strengthening exercises to see how this helps him.  Another option if he does not get any relief would be considering an open arthrotomy with upsizing his poly liner.  I do recommend a knee sleeve.  I gave him a handout about quad strengthening exercises and I have encouraged him to reach back out to us  if this is not getting better and he agrees with that as well.

## 2023-11-27 ENCOUNTER — Ambulatory Visit: Admitting: Orthopaedic Surgery

## 2024-01-30 DIAGNOSIS — I10 Essential (primary) hypertension: Secondary | ICD-10-CM | POA: Diagnosis not present

## 2024-01-30 DIAGNOSIS — E7800 Pure hypercholesterolemia, unspecified: Secondary | ICD-10-CM | POA: Diagnosis not present

## 2024-02-06 DIAGNOSIS — R944 Abnormal results of kidney function studies: Secondary | ICD-10-CM | POA: Diagnosis not present

## 2024-02-06 DIAGNOSIS — Z0000 Encounter for general adult medical examination without abnormal findings: Secondary | ICD-10-CM | POA: Diagnosis not present

## 2024-02-06 DIAGNOSIS — I10 Essential (primary) hypertension: Secondary | ICD-10-CM | POA: Diagnosis not present

## 2024-03-18 IMAGING — DX DG KNEE 1-2V PORT*R*
1 series · 2 of 2 positions shown · non-contrast
Comparison: Radiographs 10/19/2020.

CLINICAL DATA: Postop imaging.

EXAM:
PORTABLE RIGHT KNEE - 1-2 VIEW

[Series 1: knee · 0.14mm/px · 2 of 2 slices shown]
[im 1/2]
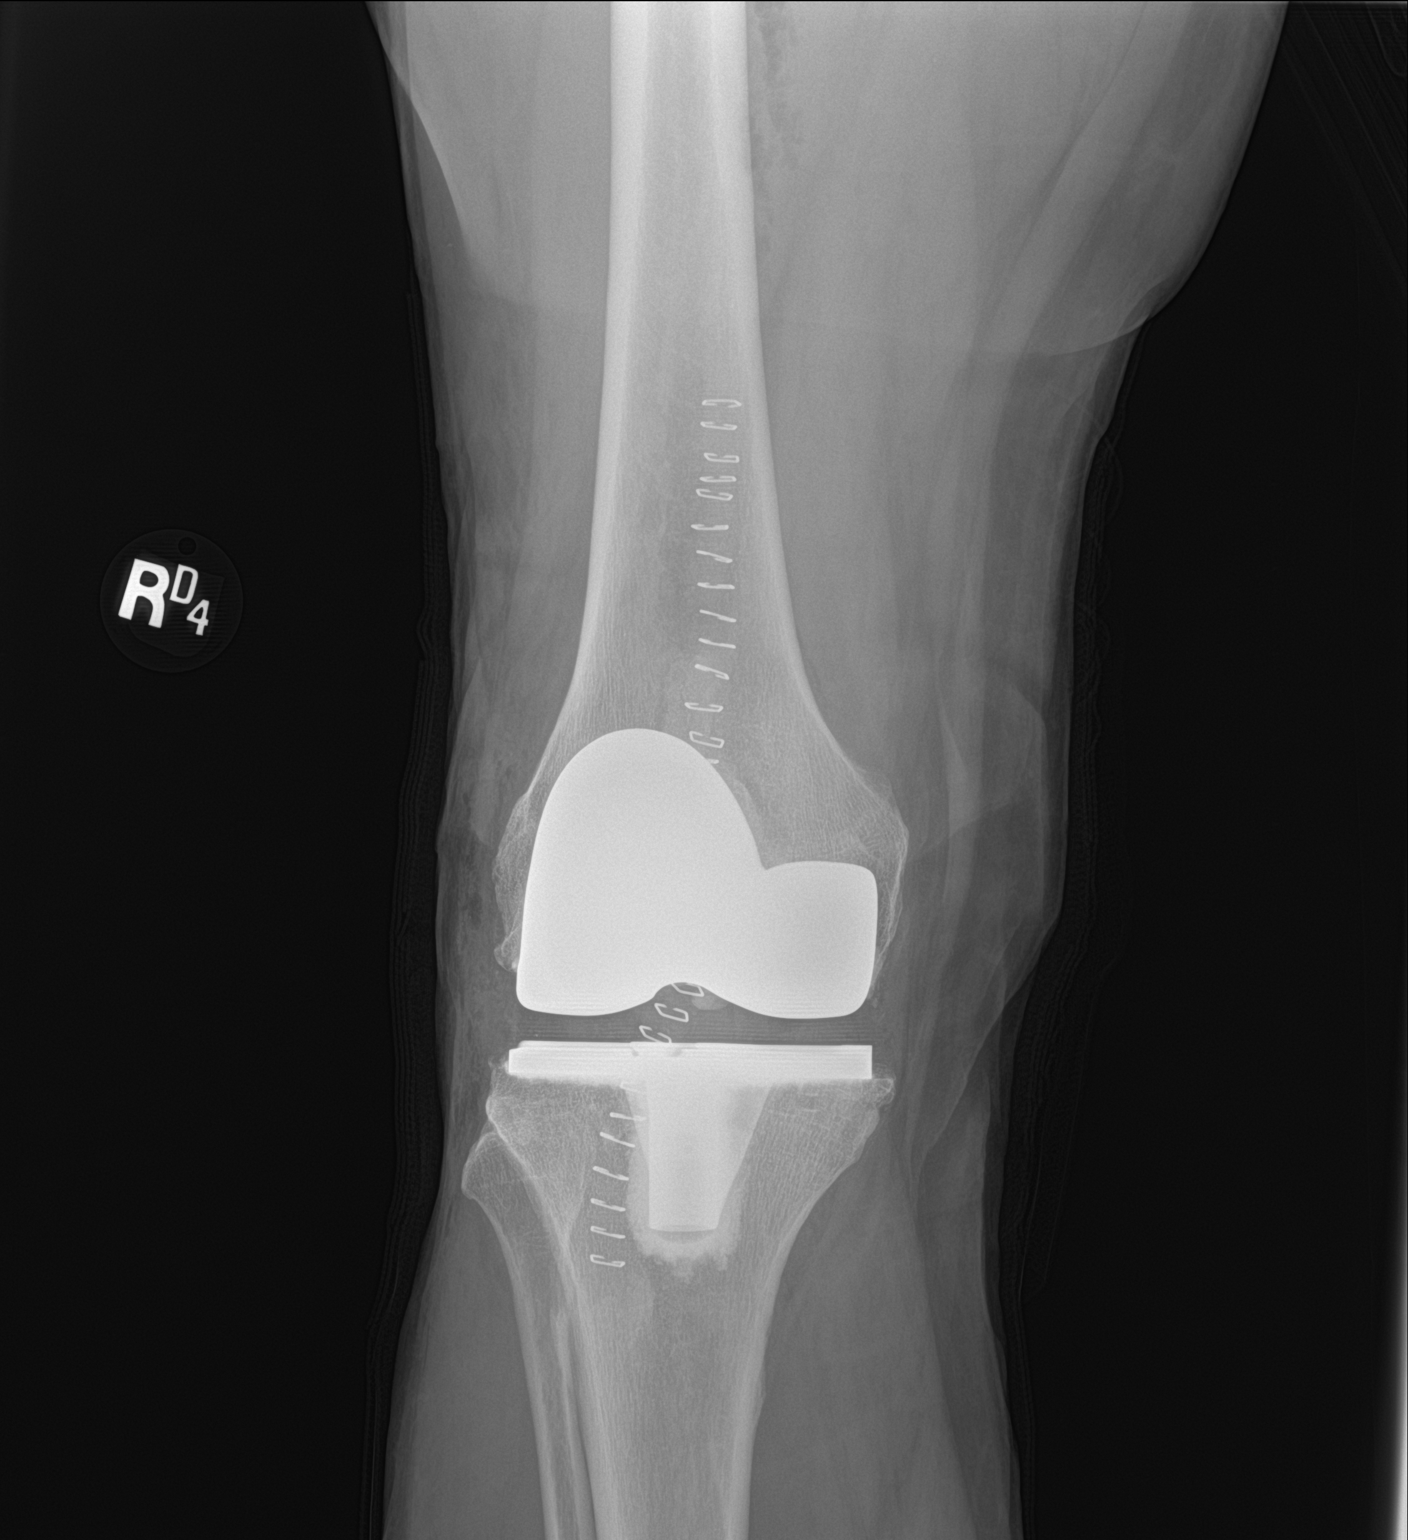
[im 2/2]
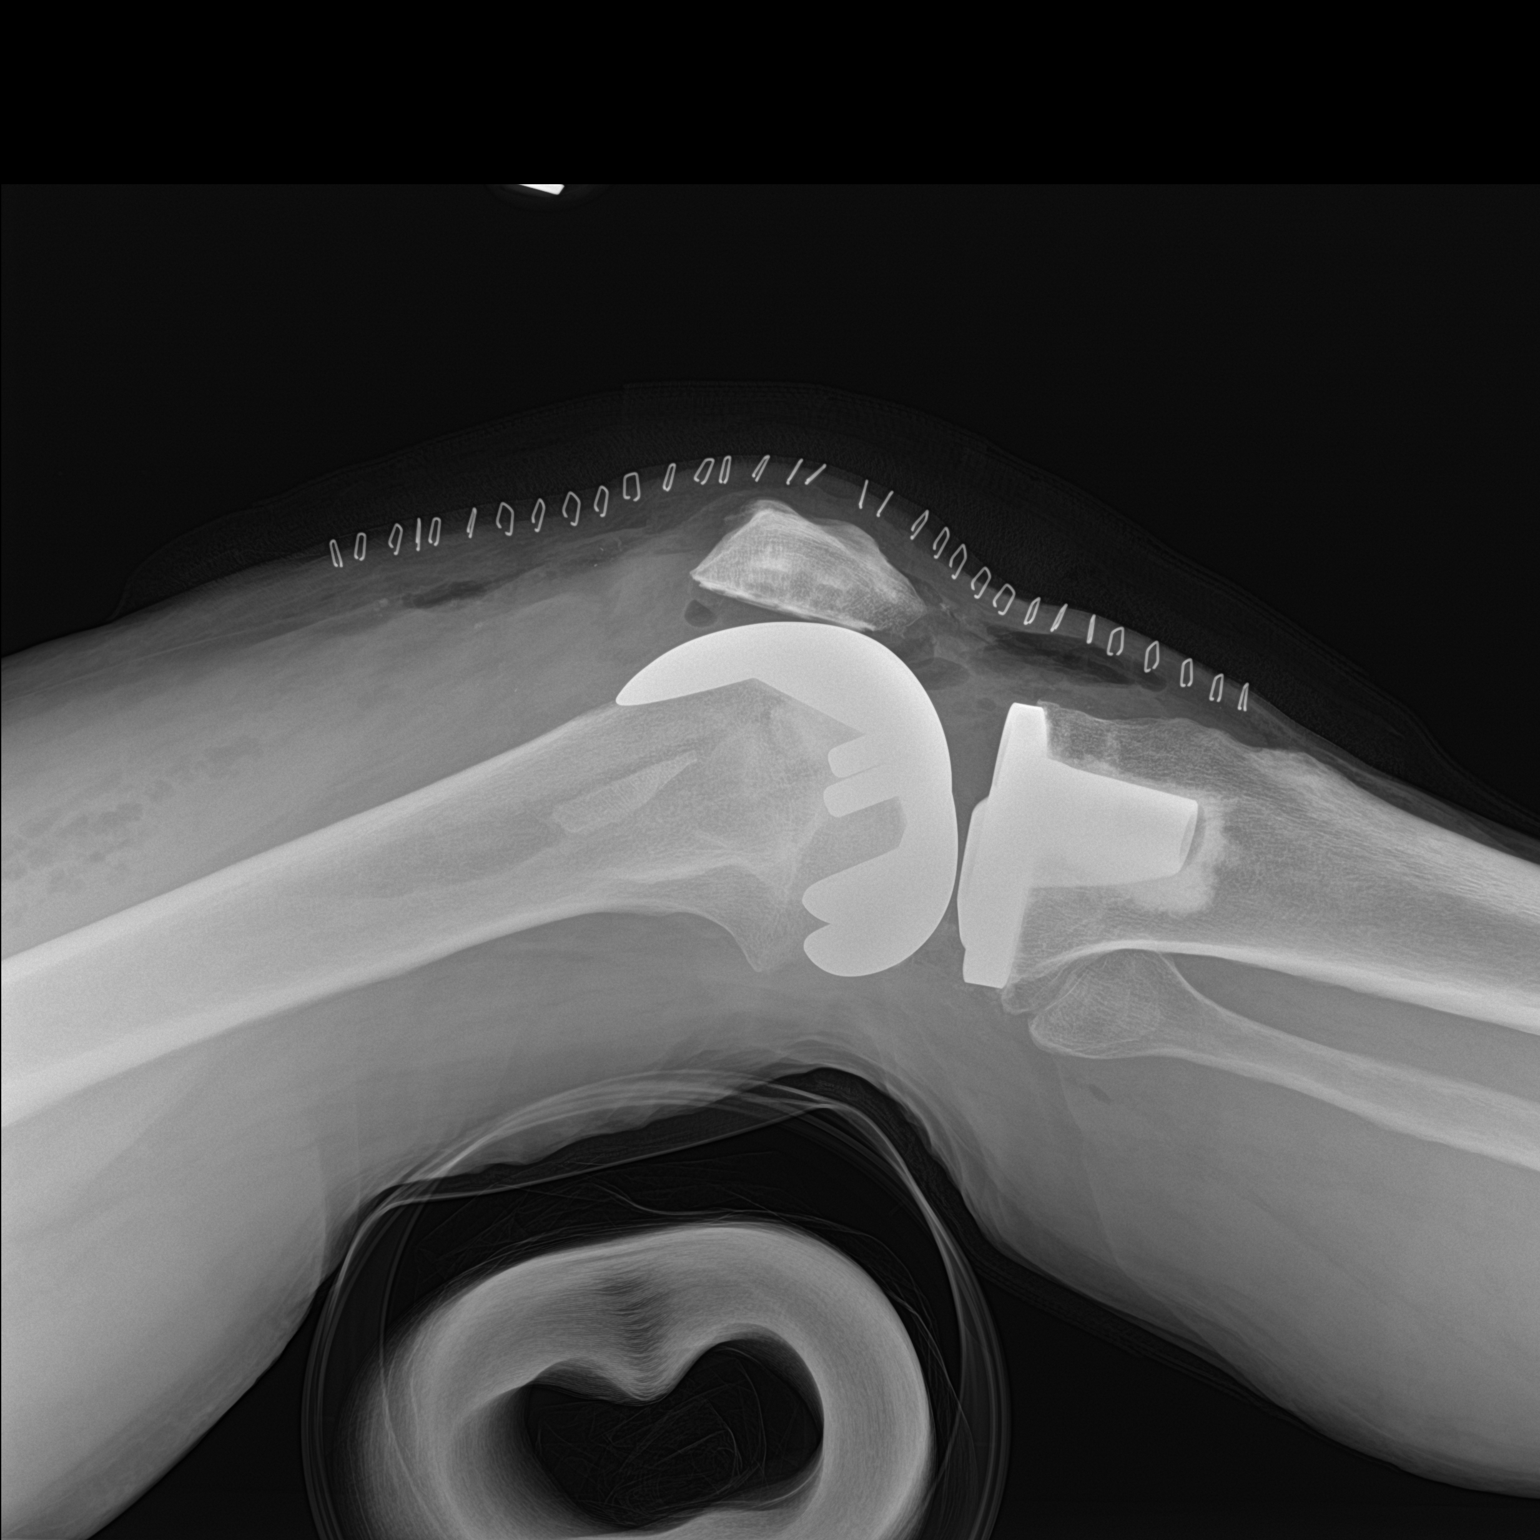

[2 of 2 positions shown; findings below may reference images not displayed]

FINDINGS: Status post interval right total knee arthroplasty. The hardware
appears well positioned. On the lateral view, there is a 3.4 cm
ossific density projecting over the distal femoral metaphysis which
could reflect a fracture fragment. This is not well seen on the
frontal examination. There is lucency projecting over the more
proximal femur. Skin staples are in place. There is gas within the
knee joint and surrounding soft tissues.
IMPRESSION: Cannot exclude a nondisplaced distal femoral fracture following
interval total knee arthroplasty. If this is unknown, consider CT
for further evaluation.

## 2024-03-23 ENCOUNTER — Encounter: Payer: Self-pay | Admitting: Radiology
